# Patient Record
Sex: Female | Born: 1995
Health system: Southern US, Community
[De-identification: ages and names within clinical notes are randomized; demographics above are authoritative.]

## PROBLEM LIST (undated history)

## (undated) ENCOUNTER — Inpatient Hospital Stay (HOSPITAL_COMMUNITY): Payer: Self-pay

## (undated) DIAGNOSIS — J45909 Unspecified asthma, uncomplicated: Secondary | ICD-10-CM

## (undated) DIAGNOSIS — I10 Essential (primary) hypertension: Secondary | ICD-10-CM

## (undated) DIAGNOSIS — F329 Major depressive disorder, single episode, unspecified: Secondary | ICD-10-CM

## (undated) DIAGNOSIS — F419 Anxiety disorder, unspecified: Secondary | ICD-10-CM

## (undated) DIAGNOSIS — F32A Depression, unspecified: Secondary | ICD-10-CM

## (undated) HISTORY — DX: Essential (primary) hypertension: I10

## (undated) HISTORY — PX: NO PAST SURGERIES: SHX2092

---
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
## 2016-07-25 ENCOUNTER — Telehealth: Payer: Self-pay

## 2016-07-25 NOTE — Telephone Encounter (Signed)
ATC pt, but all phone numbers in chart have been disconnected.  Called pt mother for updated phone number, LMTCB.    Pt's mother called [facility] re: pt having breast pain. [facility] was not able to reach pt.   [facility] Gender: Female DOB: 08/31/1995 Age: [age] Y 4 M 18 D Return Phone Number: [phone] (Primary), [phone] (Secondary) Address: City/State/Zip: [place] Client [facility] Night - Client Client Site [facility] - Night Physician Alonza Smokerodd, Jeff - MD Contact Type Call Who Is Calling Patient / Member / Family / Caregiver Call Type Triage / Clinical Caller Name Delice Bisonara Relationship To Patient Mother Return Phone Number [phone] (Secondary) Chief Complaint Breast Symptoms Reason for Call Symptomatic / Request for Health Information Initial Comment Caller States their dtr has breast pain.

## 2016-07-26 NOTE — Telephone Encounter (Signed)
LMTCB

## 2016-07-27 NOTE — Telephone Encounter (Signed)
ATC pt x3 with no success. Closing message per protocol.

## 2016-10-03 ENCOUNTER — Other Ambulatory Visit: Payer: Self-pay | Admitting: Family Medicine

## 2016-10-04 NOTE — Telephone Encounter (Signed)
Denied.  Pt has not been seen in 2 years.  Will need to establish with another provider.

## 2016-10-19 ENCOUNTER — Ambulatory Visit (INDEPENDENT_AMBULATORY_CARE_PROVIDER_SITE_OTHER): Payer: Commercial Managed Care - HMO | Admitting: Family Medicine

## 2016-10-19 ENCOUNTER — Encounter: Payer: Self-pay | Admitting: Family Medicine

## 2016-10-19 VITALS — BP 110/76 | Temp 98.2°F | Ht 65.0 in | Wt 162.0 lb

## 2016-10-19 DIAGNOSIS — N912 Amenorrhea, unspecified: Secondary | ICD-10-CM

## 2016-10-19 MED ORDER — ETHYNODIOL DIAC-ETH ESTRADIOL 1-35 MG-MCG PO TABS: ORAL_TABLET | ORAL | 4 refills | Status: DC

## 2016-10-19 NOTE — Progress Notes (Signed)
Marixa iis a 21 year old single female nonsmoker who comes in today to renew her birth control pills  She takes BCPs for dysfunction uterine bleeding. She's not sexually active. LMP was April 4 to April 9  She gets routine eye care, dental care,  Social history she is single lives by herself going to [employer].......... Study  psychology. She also works at [employer].  Vaccinations up-to-date  She takes Lamictal 100 mg daily at bedtime, Seroquel 20 mg daily at bedtime and trazodone 50 mg daily at bedtime because a history of bipolar depression. Her father has a same condition.  She's a candidate for the Gardasil. Information given. She like to think about it.  BP 110/76 (BP Location: Right Arm, Patient Position: Sitting, Cuff Size: Normal)   Temp 98.2 F (36.8 C) (Oral)   Ht  (1.651 m)   Wt 162 lb (73.5 kg)   LMP 10/05/2016   BMI 26.96 kg/m  Examination HEENT is negative except she wears glasses neck was supple thyroid is not enlarged cardiopulmonary exam normal abdominal exam normal.  Healthy female  #1 dysfunction uterine bleeding......Marland Kitchen refill BCPs  #2 bipolar depression,,,,,,,, continue psychiatric meds follow-up by psychiatrist

## 2016-10-19 NOTE — Patient Instructions (Signed)
Continue current medications  Follow-up in one year sooner if any problems  Strongly consider the Gardasil vaccine,,,,,,,,,,, [facility]

## 2016-10-19 NOTE — Progress Notes (Signed)
Pre visit review using our clinic review tool, if applicable. No additional management support is needed unless otherwise documented below in the visit note. 

## 2016-11-07 ENCOUNTER — Ambulatory Visit: Payer: Commercial Managed Care - HMO | Admitting: Family Medicine

## 2017-03-05 ENCOUNTER — Emergency Department (HOSPITAL_COMMUNITY): Admission: EM | Admit: 2017-03-05 | Discharge: 2017-03-05 | Discharge status: Against Medical Advice | Payer: 59

## 2017-07-19 ENCOUNTER — Ambulatory Visit: Payer: 59 | Admitting: Family Medicine

## 2017-07-19 ENCOUNTER — Encounter: Payer: Self-pay | Admitting: Family Medicine

## 2017-07-19 VITALS — BP 102/80 | HR 98 | Temp 98.0°F | Wt 150.0 lb

## 2017-07-19 DIAGNOSIS — N760 Acute vaginitis: Secondary | ICD-10-CM | POA: Diagnosis not present

## 2017-07-19 MED ORDER — FLUCONAZOLE 150 MG PO TABS: ORAL_TABLET | ORAL | 3 refills | Status: DC

## 2017-07-19 MED ORDER — MAGIC MOUTHWASH W/LIDOCAINE: 5.0000 mL | Freq: Four times a day (QID) | ORAL | 4 refills | Status: DC | PRN

## 2017-07-19 NOTE — Patient Instructions (Signed)
Diflucan.......Marland Kitchen. 1 now........Marland Kitchen. repeat in 3 days............. if the 2 tablets do not resolve the vaginitis then repeated  In the future do not take antibiotics if you have of canker sores.......... antibiotics early indicated for a bacterial infection  You can gargle with Magic mouthwash with lidocaine when necessary for the canker sores

## 2017-07-19 NOTE — Progress Notes (Signed)
Soleil is a delightful 22 year old single female nonsmoker who comes in today for evaluation of vaginitis  She's been to urgent care twice for canker sores. She's given 2 rounds of antibiotics and steroids. She's tried over-the-counter medicines for the vaginitis which is secondary to the antibiotics however he over-the-counter medicine is not working.  She's had canker sores for many years. It's easy triggered by certain foods or stress. Explained her that this is not something that antibiotics would cure.  She takes Seroquel and Lamictal because a history of bipolar depression. Doing very well.  She takes BCPs. LMP now  Not sexually active. BP 102/80 (BP Location: Left Arm, Patient Position: Sitting, Cuff Size: Normal)   Pulse 98   Temp 98 F (36.7 C) (Oral)   Wt 150 lb (68 kg)   BMI 24.96 kg/m  Well-developed well-nourished female no acute distress vital signs stable she's afebrile NORMAL  #1 CANKER SORES.......... advised not to take antibiotics or steroids  #2 vaginitis secondary to antibiotic........Marland Kitchen. oral Diflucan.

## 2017-08-25 ENCOUNTER — Telehealth: Payer: Self-pay | Admitting: Family Medicine

## 2017-08-25 NOTE — Telephone Encounter (Signed)
Spoke with pt stated that she is okey to wait for Dr Tawanna Coolerodd to return to the office on Monday.

## 2017-08-25 NOTE — Telephone Encounter (Signed)
Please advise. Mother is not on DPR. Call pt directly.

## 2017-08-25 NOTE — Telephone Encounter (Signed)
Closed in error.

## 2017-08-25 NOTE — Telephone Encounter (Signed)
Pt is asking if another provider can call in medication please call pt at [phone]

## 2017-08-25 NOTE — Telephone Encounter (Signed)
Copied from CRM [id number]. Topic: Quick Communication - See Telephone Encounter >> Aug 25, 2017 12:13 PM Floria RavelingStovall, Shana A wrote: CRM for notification. See Telephone encounter for: mother called in and stated that pt was recently on antibiotics and has now got a yeast infection.  She would like to know if dr can call in meds for her?    Pharmacy - [facility] [address]    08/25/17.

## 2017-08-28 ENCOUNTER — Other Ambulatory Visit: Payer: Self-pay

## 2017-08-28 MED ORDER — FLUCONAZOLE 150 MG PO TABS: ORAL_TABLET | ORAL | 4 refills | Status: DC

## 2017-08-28 NOTE — Telephone Encounter (Signed)
Refills for Diflucan 100 mg with 4 refills has been sent to the pharmacy requested.

## 2017-11-09 ENCOUNTER — Other Ambulatory Visit: Payer: Self-pay | Admitting: Family Medicine

## 2017-11-10 NOTE — Telephone Encounter (Signed)
Rx is waiting for Dr Tawanna Cooler to return to the office on monday

## 2018-01-18 ENCOUNTER — Encounter: Payer: Self-pay | Admitting: Family Medicine

## 2018-01-18 ENCOUNTER — Ambulatory Visit (INDEPENDENT_AMBULATORY_CARE_PROVIDER_SITE_OTHER): Payer: BLUE CROSS/BLUE SHIELD | Admitting: Family Medicine

## 2018-01-18 VITALS — BP 108/70 | HR 127 | Temp 98.3°F | Ht 65.0 in | Wt 148.0 lb

## 2018-01-18 DIAGNOSIS — N912 Amenorrhea, unspecified: Secondary | ICD-10-CM

## 2018-01-18 NOTE — Progress Notes (Signed)
   Subjective:    Patient ID: Julie [name], female    DOB: 02/09/1996, 21 y.o.   MRN: [id number]  HPI Here asking for a venipuncture pregnancy test. Her LMP was from June 1st to 6th, and she missed July. She has taken 4 at home pregnancy tests and all have returned positive. She feels fine.    Review of Systems  Constitutional: Negative.   Respiratory: Negative.   Cardiovascular: Negative.        Objective:   Physical Exam  Constitutional: She is oriented to person, place, and time. She appears well-developed and well-nourished.  Cardiovascular: Normal rate, regular rhythm, normal heart sounds and intact distal pulses.  Pulmonary/Chest: Effort normal and breath sounds normal.  Neurological: She is alert and oriented to person, place, and time.          Assessment & Plan:  She is probably pregnant. We will get a qualitative HCG today. [facility] if this is positive.  Gershon CraneStephen Urian Martenson, MD

## 2018-01-19 ENCOUNTER — Telehealth: Payer: Self-pay | Admitting: Family Medicine

## 2018-01-19 LAB — HCG, SERUM, QUALITATIVE: Preg, Serum: POSITIVE — AB

## 2018-01-19 NOTE — Telephone Encounter (Signed)
Copied from CRM [id number]. Topic: Quick Communication - See Telephone Encounter >> Jan 19, 2018  2:04 PM Tamela OddiMartin, Don'Quashia, NT wrote: CRM for notification. See Telephone encounter for: 01/19/18. Patient  called and is inquiring about her lab results. Please call  CB# [phone]

## 2018-01-22 NOTE — Telephone Encounter (Signed)
Pt is aware of her lab results, given on 01/19/2018

## 2018-01-24 ENCOUNTER — Telehealth: Payer: Self-pay | Admitting: Family Medicine

## 2018-01-24 NOTE — Telephone Encounter (Signed)
Pt dropped off a [employer] Form to be completed by the provider.  Upon completion pt would like to be called at [phone] so that she can pick up the form.  Form was put in the providers folder.

## 2018-01-25 NOTE — Telephone Encounter (Signed)
Form was received and is awaiting completion

## 2018-01-26 NOTE — Telephone Encounter (Signed)
Pt called stating she received call to schedule additional vaccines that are needed. Pt is pregnant and not wanting to get vaccines at this time. She is requesting to pick up form that has been completed and will schedule the other vaccinations after the baby comes.

## 2018-01-26 NOTE — Telephone Encounter (Signed)
Called pt left a detailed message to pick up form at the front office.

## 2018-01-29 ENCOUNTER — Emergency Department (HOSPITAL_COMMUNITY)
Admission: EM | Admit: 2018-01-29 | Discharge: 2018-01-30 | Disposition: A | Discharge status: Home / Self Care | Payer: BLUE CROSS/BLUE SHIELD | Attending: Emergency Medicine | Admitting: Emergency Medicine

## 2018-01-29 ENCOUNTER — Encounter (HOSPITAL_COMMUNITY): Payer: Self-pay

## 2018-01-29 DIAGNOSIS — Z79899 Other long term (current) drug therapy: Secondary | ICD-10-CM | POA: Insufficient documentation

## 2018-01-29 DIAGNOSIS — O9952 Diseases of the respiratory system complicating childbirth: Secondary | ICD-10-CM | POA: Insufficient documentation

## 2018-01-29 DIAGNOSIS — Z3A01 Less than 8 weeks gestation of pregnancy: Secondary | ICD-10-CM | POA: Insufficient documentation

## 2018-01-29 DIAGNOSIS — O4690 Antepartum hemorrhage, unspecified, unspecified trimester: Secondary | ICD-10-CM

## 2018-01-29 DIAGNOSIS — J45909 Unspecified asthma, uncomplicated: Secondary | ICD-10-CM | POA: Diagnosis not present

## 2018-01-29 DIAGNOSIS — O209 Hemorrhage in early pregnancy, unspecified: Secondary | ICD-10-CM | POA: Diagnosis not present

## 2018-01-29 NOTE — ED Triage Notes (Signed)
Pt reports being [redacted] weeks pregnant, having sex and then bleeding for about 10 minutes which has now resolved.

## 2018-01-30 ENCOUNTER — Emergency Department (HOSPITAL_COMMUNITY): Payer: BLUE CROSS/BLUE SHIELD

## 2018-01-30 DIAGNOSIS — O209 Hemorrhage in early pregnancy, unspecified: Secondary | ICD-10-CM | POA: Diagnosis not present

## 2018-01-30 LAB — CBC WITH DIFFERENTIAL/PLATELET
ABS IMMATURE GRANULOCYTES: 0 10*3/uL (ref 0.0–0.1)
BASOS PCT: 1 %
Basophils Absolute: 0 10*3/uL (ref 0.0–0.1)
EOS ABS: 0.1 10*3/uL (ref 0.0–0.7)
Eosinophils Relative: 1 %
HCT: 33.9 % — ABNORMAL LOW (ref 36.0–46.0)
Hemoglobin: 11.2 g/dL — ABNORMAL LOW (ref 12.0–15.0)
Immature Granulocytes: 0 %
Lymphocytes Relative: 51 %
Lymphs Abs: 2.7 10*3/uL (ref 0.7–4.0)
MCH: 29.6 pg (ref 26.0–34.0)
MCHC: 33 g/dL (ref 30.0–36.0)
MCV: 89.7 fL (ref 78.0–100.0)
Monocytes Absolute: 0.5 10*3/uL (ref 0.1–1.0)
Monocytes Relative: 9 %
NEUTROS ABS: 2 10*3/uL (ref 1.7–7.7)
Neutrophils Relative %: 38 %
Platelets: 262 10*3/uL (ref 150–400)
RBC: 3.78 MIL/uL — ABNORMAL LOW (ref 3.87–5.11)
RDW: 11.9 % (ref 11.5–15.5)
WBC: 5.2 10*3/uL (ref 4.0–10.5)

## 2018-01-30 LAB — WET PREP, GENITAL
Clue Cells Wet Prep HPF POC: NONE SEEN
SPERM: NONE SEEN
TRICH WET PREP: NONE SEEN
YEAST WET PREP: NONE SEEN

## 2018-01-30 LAB — ABO/RH: ABO/RH(D): O POS

## 2018-01-30 LAB — BASIC METABOLIC PANEL
Anion gap: 7 (ref 5–15)
BUN: 9 mg/dL (ref 6–20)
CALCIUM: 9.3 mg/dL (ref 8.9–10.3)
CO2: 24 mmol/L (ref 22–32)
Chloride: 107 mmol/L (ref 98–111)
Creatinine, Ser: 0.57 mg/dL (ref 0.44–1.00)
GFR calc Af Amer: >60 mL/min (ref >60)
GFR calc non Af Amer: >60 mL/min (ref >60)
Glucose, Bld: 99 mg/dL (ref 70–99)
POTASSIUM: 3.7 mmol/L (ref 3.5–5.1)
Sodium: 138 mmol/L (ref 135–145)

## 2018-01-30 LAB — HCG, QUANTITATIVE, PREGNANCY: hCG, Beta Chain, Quant, S: 21335 m[IU]/mL — ABNORMAL HIGH (ref <5)

## 2018-01-30 NOTE — ED Provider Notes (Signed)
[facility] EMERGENCY DEPARTMENT Provider Note   CSN: [id number] Arrival date & time: 01/29/18  2323     History   Chief Complaint Chief Complaint  Patient presents with  . Vaginal Bleeding    HPI Julie [name] is a 22 y.o. female.  The history is provided by the patient and medical records.  Vaginal Bleeding  Primary symptoms include vaginal bleeding.   22 year old female G1P0 approx [redacted] weeks gestation with history of anxiety, asthma, depression, presenting to the ED for lower abdominal cramping and vaginal bleeding.  Patient reports she woke up this morning was having some lower abdominal cramping.  She and her husband had sex this evening and afterwards had a small amount of bright red bleeding for about 10 minutes.  This seems to have resolved at this time.  She denies any loss of fluid, gushes of blood, passage of clots, or other concerning symptoms.  She has not yet seen her OB/GYN.  She has started prenatal vitamins.  Past Medical History:  Diagnosis Date  . Anxiety   . Asthma   . Depression     Patient Active Problem List   Diagnosis Date Noted  . Acute vaginitis 07/19/2017  . Bipolar 1 disorder, mixed, moderate (HCC) 09/10/2013  . Acne 07/26/2011  . Amenorrhea 07/26/2011    History reviewed. No pertinent surgical history.   OB History    Gravida  1   Para      Term      Preterm      AB      Living        SAB      TAB      Ectopic      Multiple      Live Births               Home Medications    Prior to Admission medications   Medication Sig Start Date End Date Taking? Authorizing Provider  folic acid (FOLVITE) 1 MG tablet Take 1 mg by mouth every morning. 01/22/18  Yes [provider]  Prenatal Vit-Fe Fumarate-FA (PRENATAL MULTIVITAMIN) TABS tablet Take 1 tablet by mouth daily at 12 noon.   Yes [provider]  QUEtiapine (SEROQUEL XR) 400 MG 24 hr tablet Take 400 mg by mouth at bedtime. 01/04/18   Yes [provider]  lamoTRIgine (LAMICTAL) 100 MG tablet Take 1 tablet (100 mg total) by mouth at bedtime. Patient not taking: Reported on 01/30/2018 12/02/13   Kendrick Fries, NP  QUEtiapine (SEROQUEL) 200 MG tablet Take 1 tablet (200 mg total) by mouth at bedtime. Patient not taking: Reported on 01/30/2018 12/02/13   Kendrick Fries, NP    Family History Family History  Problem Relation Age of Onset  . Bipolar disorder Father   . Alcohol abuse Paternal Grandmother     Social History Social History   Tobacco Use  . Smoking status: Never Smoker  . Smokeless tobacco: Never Used  Substance Use Topics  . Alcohol use: No    Comment: hx of substance abuse.Denies all now  . Drug use: No    Comment: History of drug use/clean 3 months     Allergies   Patient has no known allergies.   Review of Systems Review of Systems  Genitourinary: Positive for vaginal bleeding.  All other systems reviewed and are negative.    Physical Exam Updated Vital Signs BP 116/68   Pulse (!) 118   Temp 98.5 F (36.9 C) (  Oral)   Resp 14   Ht 5\' 5"  (1.651 m)   Wt 68 kg (150 lb)   LMP 12/02/2017   SpO2 99%   BMI 24.96 kg/m   Physical Exam  Constitutional: She is oriented to person, place, and time. She appears well-developed and well-nourished.  HENT:  Head: Normocephalic and atraumatic.  Mouth/Throat: Oropharynx is clear and moist.  Eyes: Pupils are equal, round, and reactive to light. Conjunctivae and EOM are normal.  Neck: Normal range of motion.  Cardiovascular: Normal rate, regular rhythm and normal heart sounds.  Pulmonary/Chest: Effort normal and breath sounds normal.  Abdominal: Soft. Bowel sounds are normal. There is no tenderness. There is no rigidity and no guarding.  Soft, nontender  Genitourinary:  Genitourinary Comments: Exam chaperoned by NT Normal female external genitalia without visible lesions or rash; scant amount of blood in vaginal vault that appears old  and brown; no active bleeding; no appreciable discharge; cervical os is closed  Musculoskeletal: Normal range of motion.  Neurological: She is alert and oriented to person, place, and time.  Skin: Skin is warm and dry.  Psychiatric: She has a normal mood and affect.  Nursing note and vitals reviewed.    ED Treatments / Results  Labs (all labs ordered are listed, but only abnormal results are displayed) Labs Reviewed  WET PREP, GENITAL - Abnormal; Notable for the following components:      Result Value   WBC, Wet Prep HPF POC MANY (*)    All other components within normal limits  HCG, QUANTITATIVE, PREGNANCY - Abnormal; Notable for the following components:   hCG, Beta Chain, Quant, S 21,335 (*)    All other components within normal limits  CBC WITH DIFFERENTIAL/PLATELET - Abnormal; Notable for the following components:   RBC 3.78 (*)    Hemoglobin 11.2 (*)    HCT 33.9 (*)    All other components within normal limits  BASIC METABOLIC PANEL  ABO/RH  GC/CHLAMYDIA PROBE AMP ([place]) NOT AT [facility]    EKG None  Radiology US Ob Comp Less 14 Wks  Result Date: 01/30/2018 CLINICAL DATA:  Pregnant, vaginal bleeding EXAM: OBSTETRIC <14 WK [place] AND TRANSVAGINAL OB US TECHNIQUE: Both transabdominal and transvaginal ultrasound examinations were performed for complete evaluation of the gestation as well as the maternal uterus, adnexal regions, and pelvic cul-de-sac. Transvaginal technique was performed to assess early pregnancy. COMPARISON:  None. FINDINGS: Intrauterine gestational sac: Single Yolk sac:  Visualized. Embryo:  Not Visualized. MSD: 7.2 mm   5 w   3  d Subchorionic hemorrhage:  None visualized. Maternal uterus/adnexae: Bilateral ovaries are within normal limits, noting a right corpus luteal cyst. No free fluid. IMPRESSION: Single intrauterine gestation with yolk sac, measuring 5 weeks 3 days by mean sac diameter. No fetal pole is visualized. Consider follow-up pelvic ultrasound in  14 days to confirm viability, as clinically warranted. Electronically Signed   By: Charline Bills M.D.   On: 01/30/2018 01:44   US Ob Transvaginal  Result Date: 01/30/2018 CLINICAL DATA:  Pregnant, vaginal bleeding EXAM: OBSTETRIC <14 WK [place] AND TRANSVAGINAL OB US TECHNIQUE: Both transabdominal and transvaginal ultrasound examinations were performed for complete evaluation of the gestation as well as the maternal uterus, adnexal regions, and pelvic cul-de-sac. Transvaginal technique was performed to assess early pregnancy. COMPARISON:  None. FINDINGS: Intrauterine gestational sac: Single Yolk sac:  Visualized. Embryo:  Not Visualized. MSD: 7.2 mm   5 w   3  d Subchorionic hemorrhage:  None  visualized. Maternal uterus/adnexae: Bilateral ovaries are within normal limits, noting a right corpus luteal cyst. No free fluid. IMPRESSION: Single intrauterine gestation with yolk sac, measuring 5 weeks 3 days by mean sac diameter. No fetal pole is visualized. Consider follow-up pelvic ultrasound in 14 days to confirm viability, as clinically warranted. Electronically Signed   By: Charline BillsSriyesh  Krishnan M.D.   On: 01/30/2018 01:44    Procedures Procedures (including critical care time)  Medications Ordered in ED Medications - No data to display   Initial Impression / Assessment and Plan / ED Course  I have reviewed the triage vital signs and the nursing notes.  Pertinent labs & imaging results that were available during my care of the patient were reviewed by me and considered in my medical decision making (see chart for details).  22 year old G1, P0 approximately [redacted] weeks gestation presenting to the ED with vaginal bleeding.  This began after sexual intercourse this evening with significant other.  Bleeding seemed to have stopped at this time.  Does have some lower abdominal cramping.  Pelvic exam with some scant blood in the vaginal vault that appears old, there is no acute bleeding.  Cervical os is closed.   Labs overall reassuring.  US obtained, single gestational sac wit yolk sac seen, no fetal pole visualized.  Likely too early to be seen.  She has previously scheduled follow-up with her OB/GYN in 1 week, feel this is appropriate.  Encourage pelvic rest, continue prenatals.  She will return here or [facility] hospital sooner for any new/acute changes.  Final Clinical Impressions(s) / ED Diagnoses   Final diagnoses:  Vaginal bleeding in pregnancy    ED Discharge Orders    None       Garlon HatchetSanders, Sache Sane M, PA-C 01/30/18 0248    Glynn Octaveancour, Stephen, MD 01/30/18 [phone]

## 2018-01-30 NOTE — ED Notes (Signed)
Patient Alert and oriented to baseline. Stable and ambulatory to baseline. Patient verbalized understanding of the discharge instructions.  Patient belongings were taken by the patient.   

## 2018-01-30 NOTE — Telephone Encounter (Signed)
Forms are in the file cabinet waiting on pt to pick up "NO CHARGE".

## 2018-01-30 NOTE — ED Notes (Signed)
ED Provider at bedside. 

## 2018-01-30 NOTE — Discharge Instructions (Signed)
Pelvic rest for now. Follow-up with your OB as scheduled. Continue your prenatals. Return here or women's hospital sooner for any new/acute changes.

## 2018-01-31 LAB — GC/CHLAMYDIA PROBE AMP (~~LOC~~) NOT AT ARMC
CHLAMYDIA, DNA PROBE: NEGATIVE
NEISSERIA GONORRHEA: NEGATIVE

## 2018-02-17 ENCOUNTER — Emergency Department (HOSPITAL_COMMUNITY)
Admission: EM | Admit: 2018-02-17 | Discharge: 2018-02-18 | Disposition: A | Discharge status: Home / Self Care | Payer: BLUE CROSS/BLUE SHIELD | Attending: Emergency Medicine | Admitting: Emergency Medicine

## 2018-02-17 ENCOUNTER — Emergency Department (HOSPITAL_COMMUNITY): Payer: BLUE CROSS/BLUE SHIELD

## 2018-02-17 ENCOUNTER — Emergency Department (HOSPITAL_COMMUNITY): Admission: EM | Admit: 2018-02-17 | Discharge: 2018-02-17 | Discharge status: Absent without leave | Payer: Self-pay

## 2018-02-17 ENCOUNTER — Encounter (HOSPITAL_COMMUNITY): Payer: Self-pay

## 2018-02-17 DIAGNOSIS — Z79899 Other long term (current) drug therapy: Secondary | ICD-10-CM | POA: Insufficient documentation

## 2018-02-17 DIAGNOSIS — O26891 Other specified pregnancy related conditions, first trimester: Secondary | ICD-10-CM

## 2018-02-17 DIAGNOSIS — Z3A00 Weeks of gestation of pregnancy not specified: Secondary | ICD-10-CM | POA: Insufficient documentation

## 2018-02-17 DIAGNOSIS — R109 Unspecified abdominal pain: Secondary | ICD-10-CM

## 2018-02-17 DIAGNOSIS — O4690 Antepartum hemorrhage, unspecified, unspecified trimester: Secondary | ICD-10-CM

## 2018-02-17 DIAGNOSIS — O209 Hemorrhage in early pregnancy, unspecified: Secondary | ICD-10-CM | POA: Diagnosis present

## 2018-02-17 LAB — CBC WITH DIFFERENTIAL/PLATELET
ABS IMMATURE GRANULOCYTES: 0 10*3/uL (ref 0.0–0.1)
BASOS ABS: 0 10*3/uL (ref 0.0–0.1)
BASOS PCT: 0 %
EOS PCT: 1 %
Eosinophils Absolute: 0.1 10*3/uL (ref 0.0–0.7)
HCT: 31.7 % — ABNORMAL LOW (ref 36.0–46.0)
HEMOGLOBIN: 10.6 g/dL — AB (ref 12.0–15.0)
Immature Granulocytes: 0 %
LYMPHS PCT: 42 %
Lymphs Abs: 2.7 10*3/uL (ref 0.7–4.0)
MCH: 29.9 pg (ref 26.0–34.0)
MCHC: 33.4 g/dL (ref 30.0–36.0)
MCV: 89.3 fL (ref 78.0–100.0)
Monocytes Absolute: 0.5 10*3/uL (ref 0.1–1.0)
Monocytes Relative: 8 %
Neutro Abs: 3 10*3/uL (ref 1.7–7.7)
Neutrophils Relative %: 49 %
PLATELETS: 262 10*3/uL (ref 150–400)
RBC: 3.55 MIL/uL — ABNORMAL LOW (ref 3.87–5.11)
RDW: 11.8 % (ref 11.5–15.5)
WBC: 6.3 10*3/uL (ref 4.0–10.5)

## 2018-02-17 LAB — COMPREHENSIVE METABOLIC PANEL
ALBUMIN: 3.4 g/dL — AB (ref 3.5–5.0)
ALK PHOS: 63 U/L (ref 38–126)
ALT: 13 U/L (ref 0–44)
ANION GAP: 7 (ref 5–15)
AST: 14 U/L — ABNORMAL LOW (ref 15–41)
BUN: 5 mg/dL — ABNORMAL LOW (ref 6–20)
CALCIUM: 8.9 mg/dL (ref 8.9–10.3)
CHLORIDE: 107 mmol/L (ref 98–111)
CO2: 22 mmol/L (ref 22–32)
Creatinine, Ser: 0.45 mg/dL (ref 0.44–1.00)
GFR calc non Af Amer: >60 mL/min (ref >60)
GLUCOSE: 90 mg/dL (ref 70–99)
Potassium: 3.6 mmol/L (ref 3.5–5.1)
SODIUM: 136 mmol/L (ref 135–145)
Total Bilirubin: 0.5 mg/dL (ref 0.3–1.2)
Total Protein: 5.8 g/dL — ABNORMAL LOW (ref 6.5–8.1)

## 2018-02-17 LAB — WET PREP, GENITAL
CLUE CELLS WET PREP: NONE SEEN
Sperm: NONE SEEN
TRICH WET PREP: NONE SEEN
Yeast Wet Prep HPF POC: NONE SEEN

## 2018-02-17 LAB — HCG, QUANTITATIVE, PREGNANCY: HCG, BETA CHAIN, QUANT, S: 329485 m[IU]/mL — AB (ref <5)

## 2018-02-17 LAB — POC URINE PREG, ED: Preg Test, Ur: POSITIVE — AB

## 2018-02-17 MED ORDER — SODIUM CHLORIDE 0.9 % IV BOLUS
1000.0000 mL | Freq: Once | INTRAVENOUS | Status: AC
  Administered 2018-02-17: 1000 mL via INTRAVENOUS

## 2018-02-17 NOTE — ED Provider Notes (Signed)
[facility] EMERGENCY DEPARTMENT Provider Note   CSN: [id number] Arrival date & time: 02/17/18  1749     History   Chief Complaint Chief Complaint  Patient presents with  . Vaginal Bleeding    HPI Julie [name] is a 22 y.o. female G1P0 with a hx of asthma, anxiety, depression presents to the Emergency Department complaining of 1 episode of vaginal bleeding earlier today.  She reports no persistent vaginal bleeding.  Patient reports she has had lower abdominal "soreness and cramping" for the last several weeks.  She reports it is unchanged today.  Last vaginal intercourse was 1.5 weeks ago.  Patient denies persistent vomiting, lightheadedness, weakness.  Patient reports she has a follow-up with her OB/GYN scheduled on February 27, 2018.  She denies fever, chills, headache, neck pain, chest pain, shortness of breath, vomiting, diarrhea, weakness or dizziness, syncope, dysuria, hematuria.  Review shows that patient was evaluated for vaginal bleeding in early pregnancy here in the emergency department 01/30/2018.  Ultrasound at that time showed single IUP without visible yolk sac.  Patient was to have follow-up ultrasound in 14 days for viability.  She has not yet followed up with OB/GYN or for repeat ultrasound.   The history is provided by the patient, medical records and a significant other. No language interpreter was used.    Past Medical History:  Diagnosis Date  . Anxiety   . Asthma   . Depression     Patient Active Problem List   Diagnosis Date Noted  . Acute vaginitis 07/19/2017  . Bipolar 1 disorder, mixed, moderate (HCC) 09/10/2013  . Acne 07/26/2011  . Amenorrhea 07/26/2011    History reviewed. No pertinent surgical history.   OB History    Gravida  1   Para      Term      Preterm      AB      Living        SAB      TAB      Ectopic      Multiple      Live Births               Home Medications    Prior to Admission  medications   Medication Sig Start Date End Date Taking? Authorizing Provider  folic acid (FOLVITE) 1 MG tablet Take 1 mg by mouth every morning. 01/22/18   [provider]  lamoTRIgine (LAMICTAL) 100 MG tablet Take 1 tablet (100 mg total) by mouth at bedtime. Patient not taking: Reported on 01/30/2018 12/02/13   Kendrick Fries, NP  Prenatal Vit-Fe Fumarate-FA (PRENATAL MULTIVITAMIN) TABS tablet Take 1 tablet by mouth daily at 12 noon.    [provider]  QUEtiapine (SEROQUEL XR) 400 MG 24 hr tablet Take 400 mg by mouth at bedtime. 01/04/18   [provider]  QUEtiapine (SEROQUEL) 200 MG tablet Take 1 tablet (200 mg total) by mouth at bedtime. Patient not taking: Reported on 01/30/2018 12/02/13   Kendrick Fries, NP    Family History Family History  Problem Relation Age of Onset  . Bipolar disorder Father   . Alcohol abuse Paternal Grandmother     Social History Social History   Tobacco Use  . Smoking status: Never Smoker  . Smokeless tobacco: Never Used  Substance Use Topics  . Alcohol use: No    Comment: hx of substance abuse.Denies all now  . Drug use: No    Comment: History of drug use/clean  3 months     Allergies   Patient has no known allergies.   Review of Systems Review of Systems  Constitutional: Negative for appetite change, diaphoresis, fatigue, fever and unexpected weight change.  HENT: Negative for mouth sores.   Eyes: Negative for visual disturbance.  Respiratory: Negative for cough, chest tightness, shortness of breath and wheezing.   Cardiovascular: Negative for chest pain.  Gastrointestinal: Positive for abdominal pain. Negative for constipation, diarrhea, nausea and vomiting.  Endocrine: Negative for polydipsia, polyphagia and polyuria.  Genitourinary: Positive for vaginal bleeding. Negative for dysuria, frequency, hematuria and urgency.  Musculoskeletal: Negative for back pain and neck stiffness.  Skin: Negative for rash.    Allergic/Immunologic: Negative for immunocompromised state.  Neurological: Negative for syncope, light-headedness and headaches.  Hematological: Does not bruise/bleed easily.  Psychiatric/Behavioral: Negative for sleep disturbance. The patient is not nervous/anxious.      Physical Exam Updated Vital Signs BP 121/78   Pulse 87   Temp 99.3 F (37.4 C) (Oral)   Resp 16   LMP 12/07/2017   SpO2 100%   Physical Exam  Constitutional: She appears well-developed and well-nourished. No distress.  HENT:  Head: Normocephalic and atraumatic.  Eyes: Conjunctivae are normal.  Neck: Normal range of motion.  Cardiovascular: Normal rate, regular rhythm, normal heart sounds and intact distal pulses.  No murmur heard. Pulmonary/Chest: Effort normal and breath sounds normal. No respiratory distress. She has no wheezes.  Abdominal: Soft. Bowel sounds are normal. There is no tenderness. There is no rebound and no guarding. Hernia confirmed negative in the right inguinal area and confirmed negative in the left inguinal area.  Genitourinary: Uterus normal. No labial fusion. There is no rash, tenderness or lesion on the right labia. There is no rash, tenderness or lesion on the left labia. Uterus is not deviated, not enlarged, not fixed and not tender. Cervix exhibits no motion tenderness, no discharge and no friability. Right adnexum displays no mass, no tenderness and no fullness. Left adnexum displays no mass, no tenderness and no fullness. No erythema, tenderness or bleeding in the vagina. No foreign body in the vagina. No signs of injury around the vagina. Vaginal discharge (Scant, thick, white) found.  Genitourinary Comments: No blood in the vaginal vault Cervical os closed  Musculoskeletal: Normal range of motion. She exhibits no edema.  Lymphadenopathy:       Right: No inguinal adenopathy present.       Left: No inguinal adenopathy present.  Neurological: She is alert.  Skin: Skin is warm and dry.  She is not diaphoretic. No erythema.  Psychiatric: She has a normal mood and affect.  Nursing note and vitals reviewed.    ED Treatments / Results  Labs (all labs ordered are listed, but only abnormal results are displayed) Labs Reviewed  WET PREP, GENITAL - Abnormal; Notable for the following components:      Result Value   WBC, Wet Prep HPF POC MANY (*)    All other components within normal limits  HCG, QUANTITATIVE, PREGNANCY - Abnormal; Notable for the following components:   hCG, Beta Chain, Quant, S [id number],[id number] (*)    All other components within normal limits  CBC WITH DIFFERENTIAL/PLATELET - Abnormal; Notable for the following components:   RBC 3.55 (*)    Hemoglobin 10.6 (*)    HCT 31.7 (*)    All other components within normal limits  COMPREHENSIVE METABOLIC PANEL - Abnormal; Notable for the following components:   BUN 5 (*)  Total Protein 5.8 (*)    Albumin 3.4 (*)    AST 14 (*)    All other components within normal limits  POC URINE PREG, ED - Abnormal; Notable for the following components:   Preg Test, Ur POSITIVE (*)    All other components within normal limits  URINALYSIS, ROUTINE W REFLEX MICROSCOPIC  GC/CHLAMYDIA PROBE AMP ([place]) NOT AT [facility]    Radiology US Ob Comp < 14 Wks  Result Date: 02/17/2018 CLINICAL DATA:  Vaginal bleeding.  Pregnant patient. EXAM: OBSTETRIC <14 WK [place] AND TRANSVAGINAL OB US TECHNIQUE: Both transabdominal and transvaginal ultrasound examinations were performed for complete evaluation of the gestation as well as the maternal uterus, adnexal regions, and pelvic cul-de-sac. Transvaginal technique was performed to assess early pregnancy. COMPARISON:  Pelvic ultrasound 01/30/2018 FINDINGS: Intrauterine gestational sac: Single Yolk sac:  Visualized. Embryo:  Visualized. Cardiac Activity: Visualized. Heart Rate: 165 bpm CRL:  16.8 mm   a w   1 d                  [place] EDC: 09/28/2018 Subchorionic hemorrhage:  None visualized. Maternal  uterus/adnexae: Normal right and left ovaries. Corpus luteum within the right ovary. No free fluid in the pelvis. IMPRESSION: Single live intrauterine gestation.  No subchorionic hemorrhage. Electronically Signed   By: Annia Belt M.D.   On: 02/17/2018 23:01   US Ob Transvaginal  Result Date: 02/17/2018 CLINICAL DATA:  Vaginal bleeding.  Pregnant patient. EXAM: OBSTETRIC <14 WK [place] AND TRANSVAGINAL OB US TECHNIQUE: Both transabdominal and transvaginal ultrasound examinations were performed for complete evaluation of the gestation as well as the maternal uterus, adnexal regions, and pelvic cul-de-sac. Transvaginal technique was performed to assess early pregnancy. COMPARISON:  Pelvic ultrasound 01/30/2018 FINDINGS: Intrauterine gestational sac: Single Yolk sac:  Visualized. Embryo:  Visualized. Cardiac Activity: Visualized. Heart Rate: 165 bpm CRL:  16.8 mm   a w   1 d                  [place] EDC: 09/28/2018 Subchorionic hemorrhage:  None visualized. Maternal uterus/adnexae: Normal right and left ovaries. Corpus luteum within the right ovary. No free fluid in the pelvis. IMPRESSION: Single live intrauterine gestation.  No subchorionic hemorrhage. Electronically Signed   By: Annia Belt M.D.   On: 02/17/2018 23:01    Procedures Procedures (including critical care time)  Medications Ordered in ED Medications  sodium chloride 0.9 % bolus 1,000 mL (1,000 mLs Intravenous Bolus from Bag 02/17/18 2234)     Initial Impression / Assessment and Plan / ED Course  I have reviewed the triage vital signs and the nursing notes.  Pertinent labs & imaging results that were available during my care of the patient were reviewed by me and considered in my medical decision making (see chart for details).     Reports with one episode of minor vaginal bleeding prior to arrival.  She reports lower abdominal cramping which has been constant for the last several weeks.  No worsening today.  No nausea or vomiting.  Abdomen is  soft and nontender.  Repeat ultrasound does show a single living IUP with visible heart rate.  Discussed this with patient and significant other.  She is to follow-up with her OB/GYN as directed.  Also discussed reasons to return immediately to the emergency department or MAU.   Final Clinical Impressions(s) / ED Diagnoses   Final diagnoses:  Vaginal bleeding in pregnancy  Abdominal pain during pregnancy in first  trimester    ED Discharge Orders    None       Jelan Batterton, Boyd KerbsHannah, PA-C 02/17/18 2352    Margarita Grizzleay, Danielle, MD 02/18/18 [phone]

## 2018-02-17 NOTE — Discharge Instructions (Addendum)
1. Medications: usual home medications including your prenatal vitamins 2. Treatment: rest, drink plenty of fluids,  3. Follow Up: Please followup with your OB/GYN at your scheduled appointment for discussion of your diagnoses and further evaluation after today's visit; if you do not have a primary care doctor use the resource guide provided to find one; Please return to the ER/MAU for worsening abd pain, return of vaginal bleeding, fevers or other concerns

## 2018-02-17 NOTE — ED Notes (Signed)
Patient transported to [facility]

## 2018-02-17 NOTE — ED Triage Notes (Signed)
[redacted] weeks pregnant.  G1P0.  Onset today vaginal bleeding while using bathroom.  Pt describes bleeding as "not liquid, it was thick".  NO bleeding since onset.  Did have abd cramping earlier today but pt states "that is normal".  Last intercourse 1 1/2 weeks ago.

## 2018-02-19 LAB — GC/CHLAMYDIA PROBE AMP (~~LOC~~) NOT AT ARMC
Chlamydia: NEGATIVE
NEISSERIA GONORRHEA: NEGATIVE

## 2018-03-06 ENCOUNTER — Inpatient Hospital Stay (HOSPITAL_COMMUNITY)
Admission: AD | Admit: 2018-03-06 | Discharge: 2018-03-06 | Disposition: A | Discharge status: Home / Self Care | Payer: BLUE CROSS/BLUE SHIELD | Source: Ambulatory Visit | Attending: Obstetrics and Gynecology | Admitting: Obstetrics and Gynecology

## 2018-03-06 ENCOUNTER — Encounter: Payer: Self-pay | Admitting: Student

## 2018-03-06 DIAGNOSIS — F329 Major depressive disorder, single episode, unspecified: Secondary | ICD-10-CM | POA: Diagnosis not present

## 2018-03-06 DIAGNOSIS — O99341 Other mental disorders complicating pregnancy, first trimester: Secondary | ICD-10-CM | POA: Diagnosis not present

## 2018-03-06 DIAGNOSIS — O99511 Diseases of the respiratory system complicating pregnancy, first trimester: Secondary | ICD-10-CM | POA: Diagnosis not present

## 2018-03-06 DIAGNOSIS — Z79899 Other long term (current) drug therapy: Secondary | ICD-10-CM | POA: Diagnosis not present

## 2018-03-06 DIAGNOSIS — O209 Hemorrhage in early pregnancy, unspecified: Secondary | ICD-10-CM

## 2018-03-06 DIAGNOSIS — J45909 Unspecified asthma, uncomplicated: Secondary | ICD-10-CM | POA: Insufficient documentation

## 2018-03-06 DIAGNOSIS — O4691 Antepartum hemorrhage, unspecified, first trimester: Secondary | ICD-10-CM | POA: Diagnosis not present

## 2018-03-06 DIAGNOSIS — Z3A10 10 weeks gestation of pregnancy: Secondary | ICD-10-CM | POA: Diagnosis not present

## 2018-03-06 DIAGNOSIS — F419 Anxiety disorder, unspecified: Secondary | ICD-10-CM | POA: Diagnosis not present

## 2018-03-06 LAB — URINALYSIS, ROUTINE W REFLEX MICROSCOPIC
BILIRUBIN URINE: NEGATIVE
Glucose, UA: NEGATIVE mg/dL
Hgb urine dipstick: NEGATIVE
Ketones, ur: NEGATIVE mg/dL
Leukocytes, UA: NEGATIVE
NITRITE: NEGATIVE
PROTEIN: NEGATIVE mg/dL
Specific Gravity, Urine: 1.014 (ref 1.005–1.030)
pH: 6 (ref 5.0–8.0)

## 2018-03-06 NOTE — MAU Provider Note (Signed)
Chief Complaint: Vaginal Bleeding   First Provider Initiated Contact with Patient 03/06/18 2251     SUBJECTIVE HPI: Julie [name] is a 22 y.o. G1P0 at [redacted]w[redacted]d who presents to Maternity Admissions reporting vaginal bleeding. Has noticed spotting when she wiped that started this evening. Bright red blood on toilet paper. Not having to wear a pad & not passing clots. Denies abdominal pain, vaginal discharge, or dysuria. No recent intercourse or exams. Called office & was told to come in.    Past Medical History:  Diagnosis Date  . Anxiety   . Asthma   . Depression    OB History  Gravida Para Term Preterm AB Living  1            SAB TAB Ectopic Multiple Live Births               # Outcome Date GA Lbr Len/2nd Weight Sex Delivery Anes PTL Lv  1 Current            History reviewed. No pertinent surgical history. Social History   Socioeconomic History  . Marital status: Single    Spouse name: Not on file  . Number of children: Not on file  . Years of education: Not on file  . Highest education level: Not on file  Occupational History  . 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 Financial resource strain: Not on file  . Food insecurity:    Worry: Not on file    Inability: Not on file  . Transportation needs:    Medical: Not on file    Non-medical: Not on file  Tobacco Use  . Smoking status: Never Smoker  . Smokeless tobacco: Never Used  Substance and Sexual Activity  . Alcohol use: No    Comment: hx of substance abuse.Denies all now  . Drug use: No    Comment: History of drug use/clean 3 months  . Sexual activity: Yes    Partners: Male    Birth control/protection: None    Comment: Has boyfriend/denies sexual activity  Lifestyle  . Physical activity:    Days per week: Not on file    Minutes per session: Not on file  . Stress: Not on file  Relationships  . Social connections:    Talks on phone: Not on  file    Gets together: Not on file    Attends religious service: Not on file    Active member of club or organization: Not on file    Attends meetings of clubs or organizations: Not on file    Relationship status: Not on file  . Intimate partner violence:    Fear of current or ex partner: Not on file    Emotionally abused: Not on file    Physically abused: Not on file    Forced sexual activity: Not on file  Other Topics Concern  . Not on file  Social History Narrative  . Not on file   Family History  Problem Relation Age of Onset  . Bipolar disorder Father   . Alcohol abuse Paternal Grandmother    No current facility-administered medications on file prior to encounter.    Current Outpatient Medications on File Prior to Encounter  Medication Sig Dispense Refill  . folic acid (FOLVITE) 1 MG tablet Take 1 mg by mouth every morning.  0  . lamoTRIgine (LAMICTAL) 100 MG tablet Take 1 tablet (100 mg  total) by mouth at bedtime. (Patient not taking: Reported on 01/30/2018) 30 tablet 0  . Prenatal Vit-Fe Fumarate-FA (PRENATAL MULTIVITAMIN) TABS tablet Take 1 tablet by mouth daily at 12 noon.    . QUEtiapine (SEROQUEL XR) 400 MG 24 hr tablet Take 400 mg by mouth at bedtime.  5  . QUEtiapine (SEROQUEL) 200 MG tablet Take 1 tablet (200 mg total) by mouth at bedtime. (Patient not taking: Reported on 01/30/2018) 30 tablet 0   No Known Allergies  I have reviewed patient's Past Medical Hx, Surgical Hx, Family Hx, Social Hx, medications and allergies.   Review of Systems  Constitutional: Negative.   Gastrointestinal: Negative.   Genitourinary: Positive for vaginal bleeding. Negative for dysuria and vaginal discharge.    OBJECTIVE Patient Vitals for the past 24 hrs:  BP Temp Pulse Resp SpO2 Height Weight  03/06/18 2232 135/85 98.4 F (36.9 C) (!) 101 16 100 % 5\' 5"  (1.651 m) 69 kg   Constitutional: Well-developed, well-nourished female in no acute distress.  Cardiovascular: normal rate &  rhythm, no murmur Respiratory: normal rate and effort. Lung sounds clear throughout GI: Abd soft, non-tender, Pos BS x 4. No guarding or rebound tenderness MS: Extremities nontender, no edema, normal ROM Neurologic: Alert and oriented x 4.  GU:     SPECULUM EXAM: NEFG, physiologic discharge, no blood noted, ectropion  present  BIMANUAL: No CMT. cervix closed  LAB RESULTS Results for orders placed or performed during the hospital encounter of 03/06/18 (from the past 24 hour(s))  Urinalysis, Routine w reflex microscopic     Status: None   Collection Time: 03/06/18 11:10 PM  Result Value Ref Range   Color, Urine YELLOW YELLOW   APPearance CLEAR CLEAR   Specific Gravity, Urine 1.014 1.005 - 1.030   pH 6.0 5.0 - 8.0   Glucose, UA NEGATIVE NEGATIVE mg/dL   Hgb urine dipstick NEGATIVE NEGATIVE   Bilirubin Urine NEGATIVE NEGATIVE   Ketones, ur NEGATIVE NEGATIVE mg/dL   Protein, ur NEGATIVE NEGATIVE mg/dL   Nitrite NEGATIVE NEGATIVE   Leukocytes, UA NEGATIVE NEGATIVE    IMAGING No results found.  MAU COURSE Orders Placed This Encounter  Procedures  . Urinalysis, Routine w reflex microscopic  . Discharge patient   No orders of the defined types were placed in this encounter.   MDM FHT 160 by doppler No blood on exam Negative vaginal cultures 2 weeks ago Cervix closed O positive  Unable to reach Dr. Henderson Cloud. Patient discharged in stable condition  ASSESSMENT 1. Vaginal bleeding in pregnancy, first trimester   2. [redacted] weeks gestation of pregnancy     PLAN Discharge home in stable condition. Bleeding/SAB precautions  Follow-up Information    Ob/Gyn, [facility] Follow up.   Contact information: [address] [place] [address] [phone]          Allergies as of 03/06/2018   No Known Allergies     Medication List    STOP taking these medications   lamoTRIgine 100 MG tablet Commonly known as:  LAMICTAL     TAKE these medications   folic  acid 1 MG tablet Commonly known as:  FOLVITE Take 1 mg by mouth every morning.   prenatal multivitamin Tabs tablet Take 1 tablet by mouth daily at 12 noon.   QUEtiapine 400 MG 24 hr tablet Commonly known as:  SEROQUEL XR Take 400 mg by mouth at bedtime. What changed:  Another medication with the same name was removed. Continue  taking this medication, and follow the directions you see here.        Judeth Horn, NP 03/06/2018  11:46 PM

## 2018-03-06 NOTE — MAU Note (Signed)
Pt c/o vaginal bleeding when wiping that she noticed around 9:30pm. States she has had vaginal bleeding before and its about the same. Called on call doctor and was told "to come in because it could be a miscarriage." Pt denies pain. Pt denies recent intercourse.

## 2018-03-06 NOTE — Discharge Instructions (Signed)

## 2018-03-08 ENCOUNTER — Encounter (HOSPITAL_COMMUNITY): Payer: Self-pay

## 2018-05-08 ENCOUNTER — Encounter: Payer: BLUE CROSS/BLUE SHIELD | Admitting: Family Medicine

## 2018-05-16 ENCOUNTER — Ambulatory Visit (INDEPENDENT_AMBULATORY_CARE_PROVIDER_SITE_OTHER): Payer: Self-pay | Admitting: Psychiatry

## 2018-05-16 ENCOUNTER — Encounter: Payer: Self-pay | Admitting: Psychiatry

## 2018-05-16 VITALS — BP 114/64 | HR 68 | Ht 66.0 in | Wt 162.0 lb

## 2018-05-16 DIAGNOSIS — F428 Other obsessive-compulsive disorder: Secondary | ICD-10-CM

## 2018-05-16 DIAGNOSIS — F3173 Bipolar disorder, in partial remission, most recent episode manic: Secondary | ICD-10-CM

## 2018-05-16 DIAGNOSIS — Z363 Encounter for antenatal screening for malformations: Secondary | ICD-10-CM | POA: Diagnosis not present

## 2018-05-16 DIAGNOSIS — F429 Obsessive-compulsive disorder, unspecified: Secondary | ICD-10-CM | POA: Insufficient documentation

## 2018-05-16 DIAGNOSIS — F411 Generalized anxiety disorder: Secondary | ICD-10-CM

## 2018-05-16 MED ORDER — QUETIAPINE FUMARATE ER 400 MG PO TB24: 400.0000 mg | ORAL_TABLET | Freq: Every day | ORAL | 2 refills | Status: DC

## 2018-05-16 NOTE — Progress Notes (Signed)
[facility]  Patient ID: Ledell PeoplesMahkala A Champagne,  MRN: [id number]  PCP: Roderick Peeodd, Jeffrey A, MD  Date of Evaluation: 05/16/2018 Time spent:20 minutes  Chief Complaint:  Chief Complaint    Depression; Anxiety      HISTORY/CURRENT STATUS: Maddyx is seen individually face-to-face with consent not collateral for psychiatric interview and exam in 6052-month evaluation and management of bipolar disorder and generalized and obsessional act anxiety now comorbid with pregnancy.  She has been declining opportunity for more frequent appointments as she attends [employer] college classes for 5 days weekly in [place] [employer] and works 6 days weekly having little remaining time.  She has no interim decompensations, but she reviews the discussion with female obstetrician she appreciates greatly concluding the need to continue the Seroquel considering depression and anxiety to have more negative consequences for the pregnancy and baby than patient being stable on her Seroquel without Lamictal.  She worried for a month about stopping the Lamictal but has found she can function safely and successfully on the Seroquel alone.  She does take her folic acid and prenatal vitamin.  She is engaged but they defer marriage until she is out of college.  She has the support of her family of origin.  Depression       The patient presents with depression.  This is a chronic problem.  The current episode started more than 1 year ago.   The onset quality is gradual.   The problem occurs intermittently.  The most recent episode lasted 2 months.    The problem has been gradually improving since onset.  Associated symptoms include body aches.  Associated symptoms include no decreased concentration, no fatigue, no helplessness, no hopelessness, does not have insomnia, not irritable, no restlessness, no decreased interest, no appetite change, no myalgias, no headaches, no indigestion, not sad and no suicidal ideas.     The  symptoms are aggravated by work stress and social issues.  Past treatments include other medications.  Compliance with treatment is good.  Past compliance problems include medical issues and medication issues.  Previous treatment provided significant relief.  Risk factors include a change in medication usage/dosage, family history of mental illness, history of mental illness, major life event and stress.   Past medical history includes terminal illness, anxiety, bipolar disorder, depression and obsessive-compulsive disorder.     Pertinent negatives include no chronic fatigue syndrome, no chronic pain, no fibromyalgia, no hypothyroidism, no thyroid problem, no chronic illness, no recent illness, no life-threatening condition, no physical disability, no recent psychiatric admission, no Alzheimer's disease, no brain trauma, no dementia, no eating disorder, no mental health disorder, no post-traumatic stress disorder, no schizophrenia, no suicide attempts and no head trauma. Anxiety  Presents for follow-up visit. Symptoms include excessive worry, nervous/anxious behavior and obsessions. Patient reports no chest pain, confusion, decreased concentration, depressed mood, dizziness, dry mouth, feeling of choking, hyperventilation, impotence, insomnia, irritability, malaise, muscle tension, nausea, palpitations, panic, restlessness, shortness of breath or suicidal ideas. Symptoms occur occasionally. The most recent episode lasted 45 minutes. The severity of symptoms is moderate. The quality of sleep is fair. Nighttime awakenings: occasional.   Her past medical history is significant for depression. There is no history of suicide attempts. Compliance with medications is 76-100%.    Individual Medical History/ Review of Systems: Changes? :Yes  EDC is 09/28/2018 working well with her obstetrician on prenatal vitamin and folic acid.  She has eyeglasses and the history of LDL cholesterol elevation with all medical  laboratory monitoring through the obstetrician now that she is pregnant.  Allergies: Patient has no known allergies.  Current Medications:  Current Outpatient Medications:  .  folic acid (FOLVITE) 1 MG tablet, Take 1 mg by mouth every morning., Disp: , Rfl: 0 .  Prenatal Vit-Fe Fumarate-FA (PRENATAL MULTIVITAMIN) TABS tablet, Take 1 tablet by mouth daily at 12 noon., Disp: , Rfl:  .  QUEtiapine (SEROQUEL XR) 400 MG 24 hr tablet, Take 1 tablet (400 mg total) by mouth at bedtime., Disp: 30 tablet, Rfl: 2 Medication Side Effects: none  Family Medical/ Social History: Changes? Yes .  She is engaged but deferring marriage until after college now at [employer] 5 days weekly and still working 6 days weekly.  Father has bipolar treated successfully with Seroquel  MENTAL HEALTH EXAM: Muscle strength 5/5, postural reflexes 0/0, and AIMS = 0. Blood pressure 114/64, pulse 68, height 5\' 6"  (1.676 m), weight 162 lb (73.5 kg), last menstrual period 12/07/2017.Body mass index is 26.15 kg/m.  General Appearance: Casual and Fairly Groomed  Eye Contact:  Fair  Speech:  Clear and Coherent  Volume:  Normal  Mood:  Anxious and Euthymic  Affect:  Full Range and Anxious  Thought Process:  Goal Directed  Orientation:  Full (Time, Place, and Person)  Thought Content: Obsessions and Rumination   Suicidal Thoughts:  No  Homicidal Thoughts:  No  Memory:  Remote;   Good  Judgement:  Good  Insight:  Fair  Psychomotor Activity:  Normal  Concentration:  Concentration: Fair and Attention Span: Good  Recall:  Good  Fund of Knowledge: Good  Language: Good  Assets:  Desire for Improvement Talents/Skills Vocational/Educational  ADL's:  Intact  Cognition: WNL  Prognosis:  Good    DIAGNOSES:    ICD-10-CM   1. Bipolar I disorder, moderate, current or most recent episode manic, in partial remission, with anxious distress (HCC) F31.73   2. Generalized anxiety disorder F41.1   3. Other obsessive-compulsive  disorders F42.8     Receiving Psychotherapy: No    RECOMMENDATIONS: We discuss third trimester management of the Seroquel daily to reduce the dose as possible, applying different discussion but similar options through the second trimester.  She has an anatomical sonography of the pregnancy in the next week.  Current dosing is continued as concluded for Seroquel 400 mg ER nightly sending a month supply and 2 refills to [facility] on [address] [place].  Tentative behavioral and self-management skills addressed and updated particularly for integration in pregnancy care particularly surrounding delivery and newborn care responsibilities.  She copes well with the session but is not interested in or going to return early for such updates, to be seen again in 3 months.  Chauncey Mann, MD

## 2018-05-29 ENCOUNTER — Inpatient Hospital Stay (HOSPITAL_COMMUNITY)
Admission: AD | Admit: 2018-05-29 | Discharge: 2018-05-29 | Disposition: A | Discharge status: Home / Self Care | Payer: Medicaid Other | Source: Ambulatory Visit | Attending: Obstetrics and Gynecology | Admitting: Obstetrics and Gynecology

## 2018-05-29 ENCOUNTER — Encounter (HOSPITAL_COMMUNITY): Payer: Self-pay

## 2018-05-29 DIAGNOSIS — Z3A22 22 weeks gestation of pregnancy: Secondary | ICD-10-CM | POA: Insufficient documentation

## 2018-05-29 DIAGNOSIS — R109 Unspecified abdominal pain: Secondary | ICD-10-CM

## 2018-05-29 DIAGNOSIS — Z79899 Other long term (current) drug therapy: Secondary | ICD-10-CM | POA: Insufficient documentation

## 2018-05-29 DIAGNOSIS — O26892 Other specified pregnancy related conditions, second trimester: Secondary | ICD-10-CM | POA: Diagnosis not present

## 2018-05-29 DIAGNOSIS — Z3492 Encounter for supervision of normal pregnancy, unspecified, second trimester: Secondary | ICD-10-CM

## 2018-05-29 LAB — URINALYSIS, ROUTINE W REFLEX MICROSCOPIC
Bilirubin Urine: NEGATIVE
Glucose, UA: 50 mg/dL — AB
Ketones, ur: 5 mg/dL — AB
Nitrite: NEGATIVE
Protein, ur: NEGATIVE mg/dL
Specific Gravity, Urine: 1.023 (ref 1.005–1.030)
pH: 6 (ref 5.0–8.0)

## 2018-05-29 NOTE — MAU Note (Signed)
Pt here with c/o cramping since about 1930; denies any bleeding or leaking

## 2018-05-29 NOTE — MAU Provider Note (Signed)
History     CSN: [id number]  Arrival date and time: 05/29/18 2114   First Provider Initiated Contact with Patient 05/29/18 2155      Chief Complaint  Patient presents with  . Abdominal Pain   Julie [name] is a 22 y.o. G1P0 at [redacted]w[redacted]d who presents to MAU with complaints of abdominal pain. She reports pain started today around 1930, describes pain as cramping that initially started in her back and radiated around to her lower abdomen, rated pain 5/10- did not take any medication prior to arrival to MAU. She reports prior to coming to MAU the pain resolved without medication and she is not having pain currently. Denies complications during this pregnancy. Denies vaginal bleeding, discharge or LOF. She reports see has not felt fetal movement since this morning, she usually feels movement every couple of hours but reports this being a change.    OB History    Gravida  1   Para      Term      Preterm      AB      Living        SAB      TAB      Ectopic      Multiple      Live Births              Past Medical History:  Diagnosis Date  . Anxiety   . Asthma   . Depression     History reviewed. No pertinent surgical history.  Family History  Problem Relation Age of Onset  . Bipolar disorder Father   . Alcohol abuse Paternal Grandmother     Social History   Tobacco Use  . Smoking status: Never Smoker  . Smokeless tobacco: Never Used  Substance Use Topics  . Alcohol use: No    Comment: hx of substance abuse.Denies all now  . Drug use: No    Comment: History of drug use/clean 3 months    Allergies: No Known Allergies  Medications Prior to Admission  Medication Sig Dispense Refill Last Dose  . folic acid (FOLVITE) 1 MG tablet Take 1 mg by mouth every morning.  0 01/29/2018 at Unknown time  . Prenatal Vit-Fe Fumarate-FA (PRENATAL MULTIVITAMIN) TABS tablet Take 1 tablet by mouth daily at 12 noon.   01/29/2018 at Unknown time  . QUEtiapine (SEROQUEL XR)  400 MG 24 hr tablet Take 1 tablet (400 mg total) by mouth at bedtime. 30 tablet 2     Review of Systems  Constitutional: Negative.   Respiratory: Negative.   Cardiovascular: Negative.   Gastrointestinal: Positive for abdominal pain. Negative for constipation, diarrhea, nausea and vomiting.       Resolved   Genitourinary: Negative.    Physical Exam   Blood pressure 134/64, temperature 98.4 F (36.9 C), temperature source Oral, resp. rate 18, height 5\' 5"  (1.651 m), weight 74.8 kg, last menstrual period 12/07/2017, SpO2 100 %.  Physical Exam  Nursing note and vitals reviewed. Constitutional: She is oriented to person, place, and time. She appears well-developed and well-nourished. No distress.  Cardiovascular: Normal rate, regular rhythm and normal heart sounds.  Respiratory: Effort normal and breath sounds normal. No respiratory distress. She has no wheezes. She has no rales.  GI: Soft. She exhibits no distension. There is no tenderness. There is no rebound and no guarding.  Genitourinary: No vaginal discharge found.  Musculoskeletal: Normal range of motion. She exhibits no edema.  Neurological: She is alert  and oriented to person, place, and time.   Dilation: Closed Exam by:: Aundria Rudogers, CNM  FHR 154 by doppler   Bedside US performed  Pt informed that the ultrasound is considered a limited OB ultrasound and is not intended to be a complete ultrasound exam.  Patient also informed that the ultrasound is not being completed with the intent of assessing for fetal or placental anomalies or any pelvic abnormalities.  Explained that the purpose of today's ultrasound is to assess for  movement and presentation.  Patient acknowledges the purpose of the exam and the limitations of the study.   Fetal movement seen on US, 5 movements seen in less than 10 minutes, patient reports feeling movement prior to bedside US.   MAU Course  Procedures  MDM Bedside US  UA Cervical examination :  closed/posterior   Educated and discussed fetal movement during pregnancy, educated on fetal kick counts and can start tracing after 24 weeks. Discussed reasons to return to MAU. Educated and discussed abdominal pain during pregnancy and safe medications during pregnancy. Encouraged to increase hydration during pregnancy. Patient reports continued fetal movement prior to discharge home. Follow up as scheduled for prenatal appointment.   Assessment and Plan   1. Fetal movement present, second trimester   2. [redacted] weeks gestation of pregnancy   3. Abdominal pain during pregnancy in second trimester    Discharge home  Hydration  Follow up as scheduled for prenatal appointment  Return to MAU as needed Safe medications during pregnancy   Sharyon CableVeronica C Sarajean Dessert CNM 05/29/2018, 10:59 PM

## 2018-05-29 NOTE — Discharge Instructions (Signed)

## 2018-05-31 LAB — CULTURE, OB URINE: Culture: NO GROWTH

## 2018-06-07 ENCOUNTER — Encounter (HOSPITAL_COMMUNITY): Payer: Self-pay

## 2018-07-04 NOTE — L&D Delivery Note (Addendum)
Delivery Note At 12:03 PM a viable and healthy female was delivered via Vaginal, Spontaneous (Presentation: right occiput; anterior ).  APGAR: 8, 9; weight pending .   Placenta status: spontaneous, intact, with marginal abruption noted.  Cord: 3V   Anesthesia: epidural Episiotomy: None Lacerations: 2nd degree;Perineal Suture Repair: 3.0 Est. Blood Loss (mL):  469 cc  Mom to postpartum.  Baby to Couplet care / Skin to Skin.  Waynard Reeds 09/26/2018, 12:31 PM

## 2018-07-05 DIAGNOSIS — Z3480 Encounter for supervision of other normal pregnancy, unspecified trimester: Secondary | ICD-10-CM | POA: Diagnosis not present

## 2018-07-05 DIAGNOSIS — Z23 Encounter for immunization: Secondary | ICD-10-CM | POA: Diagnosis not present

## 2018-07-12 ENCOUNTER — Other Ambulatory Visit: Payer: Self-pay

## 2018-07-12 ENCOUNTER — Encounter (HOSPITAL_COMMUNITY): Payer: Self-pay

## 2018-07-12 ENCOUNTER — Inpatient Hospital Stay (HOSPITAL_COMMUNITY)
Admission: AD | Admit: 2018-07-12 | Discharge: 2018-07-12 | Disposition: A | Discharge status: Home / Self Care | Payer: Medicaid Other | Source: Ambulatory Visit | Attending: Obstetrics | Admitting: Obstetrics

## 2018-07-12 DIAGNOSIS — Z3493 Encounter for supervision of normal pregnancy, unspecified, third trimester: Secondary | ICD-10-CM

## 2018-07-12 DIAGNOSIS — R42 Dizziness and giddiness: Secondary | ICD-10-CM | POA: Diagnosis not present

## 2018-07-12 DIAGNOSIS — O26893 Other specified pregnancy related conditions, third trimester: Secondary | ICD-10-CM | POA: Insufficient documentation

## 2018-07-12 DIAGNOSIS — O368130 Decreased fetal movements, third trimester, not applicable or unspecified: Secondary | ICD-10-CM

## 2018-07-12 DIAGNOSIS — Z3A28 28 weeks gestation of pregnancy: Secondary | ICD-10-CM | POA: Diagnosis not present

## 2018-07-12 LAB — CBC
HCT: 31.5 % — ABNORMAL LOW (ref 36.0–46.0)
HEMOGLOBIN: 10.5 g/dL — AB (ref 12.0–15.0)
MCH: 30.3 pg (ref 26.0–34.0)
MCHC: 33.3 g/dL (ref 30.0–36.0)
MCV: 90.8 fL (ref 80.0–100.0)
Platelets: 283 10*3/uL (ref 150–400)
RBC: 3.47 MIL/uL — ABNORMAL LOW (ref 3.87–5.11)
RDW: 13 % (ref 11.5–15.5)
WBC: 8 10*3/uL (ref 4.0–10.5)
nRBC: 0 % (ref 0.0–0.2)

## 2018-07-12 LAB — URINALYSIS, ROUTINE W REFLEX MICROSCOPIC
Bilirubin Urine: NEGATIVE
Glucose, UA: NEGATIVE mg/dL
Hgb urine dipstick: NEGATIVE
Ketones, ur: 5 mg/dL — AB
Nitrite: NEGATIVE
Protein, ur: NEGATIVE mg/dL
Specific Gravity, Urine: 1.014 (ref 1.005–1.030)
pH: 8 (ref 5.0–8.0)

## 2018-07-12 LAB — GLUCOSE, CAPILLARY: GLUCOSE-CAPILLARY: 71 mg/dL (ref 70–99)

## 2018-07-12 NOTE — Discharge Instructions (Signed)

## 2018-07-12 NOTE — MAU Provider Note (Signed)
History     CSN: [id number]  Arrival date and time: 07/12/18 1501   First Provider Initiated Contact with Patient 07/12/18 1612      Chief Complaint  Patient presents with  . Decreased Fetal Movement   HPI Julie [name] is a 23 y.o. G1P0 at [redacted]w[redacted]d who presents with decreased fetal movement and dizziness. She states she ate cereal for breakfast and did not eat anything else the rest of the day. She states she had not felt the baby move since this morning but reports normal fetal movement since her arrival in MAU. She denies any leaking or bleeding. Denies any pain.   OB History    Gravida  1   Para      Term      Preterm      AB      Living        SAB      TAB      Ectopic      Multiple      Live Births              Past Medical History:  Diagnosis Date  . Anxiety   . Asthma   . Depression     History reviewed. No pertinent surgical history.  Family History  Problem Relation Age of Onset  . Bipolar disorder Father   . Alcohol abuse Paternal Grandmother     Social History   Tobacco Use  . Smoking status: Never Smoker  . Smokeless tobacco: Never Used  Substance Use Topics  . Alcohol use: No    Comment: hx of substance abuse.Denies all now  . Drug use: No    Comment: History of drug use/clean 3 months    Allergies: No Known Allergies  Medications Prior to Admission  Medication Sig Dispense Refill Last Dose  . folic acid (FOLVITE) 1 MG tablet Take 1 mg by mouth every morning.  0 07/11/2018 at Unknown time  . Prenatal Vit-Fe Fumarate-FA (PRENATAL MULTIVITAMIN) TABS tablet Take 1 tablet by mouth daily at 12 noon.   07/11/2018 at Unknown time  . QUEtiapine (SEROQUEL XR) 400 MG 24 hr tablet Take 1 tablet (400 mg total) by mouth at bedtime. 30 tablet 2 07/11/2018 at Unknown time    Review of Systems  Constitutional: Negative.  Negative for fatigue and fever.  HENT: Negative.   Respiratory: Negative.  Negative for shortness of breath.    Cardiovascular: Negative.  Negative for chest pain.  Gastrointestinal: Negative.  Negative for abdominal pain, constipation, diarrhea, nausea and vomiting.  Genitourinary: Negative.  Negative for dysuria, vaginal bleeding and vaginal discharge.  Neurological: Positive for dizziness. Negative for headaches.   Physical Exam   Blood pressure 109/65, pulse 98, temperature 98.4 F (36.9 C), resp. rate 12, last menstrual period 12/07/2017, SpO2 98 %.  Physical Exam  Nursing note and vitals reviewed. Constitutional: She is oriented to person, place, and time. She appears well-developed and well-nourished. No distress.  HENT:  Head: Normocephalic.  Eyes: Pupils are equal, round, and reactive to light.  Cardiovascular: Normal rate, regular rhythm and normal heart sounds.  Respiratory: Effort normal and breath sounds normal. No respiratory distress.  GI: Soft. Bowel sounds are normal. She exhibits no distension. There is no abdominal tenderness.  Neurological: She is alert and oriented to person, place, and time.  Skin: Skin is warm and dry.  Psychiatric: She has a normal mood and affect. Her behavior is normal. Judgment and thought content normal.  Fetal Tracing:  Baseline: 135 Variability: moderate  Accels:10x10 Decels: none  Toco: 3 uc's, resolved   MAU Course  Procedures Results for orders placed or performed during the hospital encounter of 07/12/18 (from the past 24 hour(s))  Glucose, capillary     Status: None   Collection Time: 07/12/18  3:36 PM  Result Value Ref Range   Glucose-Capillary 71 70 - 99 mg/dL  Urinalysis, Routine w reflex microscopic     Status: Abnormal   Collection Time: 07/12/18  4:10 PM  Result Value Ref Range   Color, Urine YELLOW YELLOW   APPearance CLOUDY (A) CLEAR   Specific Gravity, Urine 1.014 1.005 - 1.030   pH 8.0 5.0 - 8.0   Glucose, UA NEGATIVE NEGATIVE mg/dL   Hgb urine dipstick NEGATIVE NEGATIVE   Bilirubin Urine NEGATIVE NEGATIVE    Ketones, ur 5 (A) NEGATIVE mg/dL   Protein, ur NEGATIVE NEGATIVE mg/dL   Nitrite NEGATIVE NEGATIVE   Leukocytes, UA SMALL (A) NEGATIVE   RBC / HPF 0-5 0 - 5 RBC/hpf   WBC, UA 6-10 0 - 5 WBC/hpf   Bacteria, UA FEW (A) NONE SEEN   Squamous Epithelial / LPF 0-5 0 - 5   Amorphous Crystal PRESENT   CBC     Status: Abnormal   Collection Time: 07/12/18  4:19 PM  Result Value Ref Range   WBC 8.0 4.0 - 10.5 K/uL   RBC 3.47 (L) 3.87 - 5.11 MIL/uL   Hemoglobin 10.5 (L) 12.0 - 15.0 g/dL   HCT 33.7 (L) 44.5 - 14.6 %   MCV 90.8 80.0 - 100.0 fL   MCH 30.3 26.0 - 34.0 pg   MCHC 33.3 30.0 - 36.0 g/dL   RDW 04.7 99.8 - 72.1 %   Platelets 283 150 - 400 K/uL   nRBC 0.0 0.0 - 0.2 %   MDM UA CBC CBG PO hydration  Patient reports normal fetal movement. Lengthy discussion with patient about food choices in pregnancy including high protein foods and snacks  Assessment and Plan   1. Movement of fetus present during pregnancy in third trimester   2. Dizziness   3. [redacted] weeks gestation of pregnancy    -Discharge home in stable condition -Kick count precautions discussed -Patient advised to follow-[facility] with [facility] as scheduled for prenatal care -Patient may return to MAU as needed or if her condition were to change or worsen  Rolm Bookbinder CNM 07/12/2018, 4:12 PM

## 2018-07-12 NOTE — MAU Note (Signed)
Pt reports feeling light headed yesterday that has continued into today.   Pt reports a headache.  Pt reports not feeling the baby move since last night at 1000.   Denies vaginal bleeding or LOF.

## 2018-08-03 ENCOUNTER — Other Ambulatory Visit: Payer: Self-pay | Admitting: Psychiatry

## 2018-08-09 DIAGNOSIS — O99019 Anemia complicating pregnancy, unspecified trimester: Secondary | ICD-10-CM | POA: Diagnosis not present

## 2018-08-27 ENCOUNTER — Other Ambulatory Visit: Payer: Self-pay | Admitting: Psychiatry

## 2018-09-04 ENCOUNTER — Encounter: Payer: Self-pay | Admitting: Pediatrics

## 2018-09-04 ENCOUNTER — Ambulatory Visit (INDEPENDENT_AMBULATORY_CARE_PROVIDER_SITE_OTHER): Payer: Self-pay | Admitting: Pediatrics

## 2018-09-04 DIAGNOSIS — Z7681 Expectant parent(s) prebirth pediatrician visit: Secondary | ICD-10-CM

## 2018-09-04 NOTE — Progress Notes (Signed)
Prenatal counseling for impending newborn done-- Z76.81  

## 2018-09-06 DIAGNOSIS — Z3480 Encounter for supervision of other normal pregnancy, unspecified trimester: Secondary | ICD-10-CM | POA: Diagnosis not present

## 2018-09-09 ENCOUNTER — Encounter (HOSPITAL_COMMUNITY): Payer: Self-pay

## 2018-09-09 ENCOUNTER — Other Ambulatory Visit: Payer: Self-pay

## 2018-09-09 ENCOUNTER — Inpatient Hospital Stay (HOSPITAL_COMMUNITY)
Admission: AD | Admit: 2018-09-09 | Discharge: 2018-09-09 | Disposition: A | Discharge status: Home / Self Care | Payer: Medicaid Other | Attending: Obstetrics and Gynecology | Admitting: Obstetrics and Gynecology

## 2018-09-09 DIAGNOSIS — Z3A37 37 weeks gestation of pregnancy: Secondary | ICD-10-CM

## 2018-09-09 DIAGNOSIS — Z3689 Encounter for other specified antenatal screening: Secondary | ICD-10-CM

## 2018-09-09 DIAGNOSIS — Z0371 Encounter for suspected problem with amniotic cavity and membrane ruled out: Secondary | ICD-10-CM

## 2018-09-09 NOTE — Discharge Instructions (Signed)

## 2018-09-09 NOTE — MAU Note (Signed)
Presents with c/o LOF this afternoon @ 1200, has continued to leak, fluid clear.  Denies VB.  Reports +FM.

## 2018-09-09 NOTE — MAU Provider Note (Signed)
First Provider Initiated Contact with Patient 09/09/18 1359     S: Ms. Julie [name] is a 23 y.o. G1P0 at [redacted]w[redacted]d  who presents to MAU today complaining of leaking of fluid since about 11am today. Patient states she was getting dressed for work, "hopping up and down to get my pants on" when she felt as if she passed fluid. She denies gush of fluid, ongoing leaking, abdominal pain. She reports seeing a small amount of pink-tinged mucus just after she got dressed this morning. She reports normal fetal movement.    O: BP 132/82   Pulse (!) 114   Temp 98.5 F (36.9 C) (Oral)   Resp 20   Ht 5\' 5"  (1.651 m)   Wt 85.1 kg   LMP 12/07/2017   SpO2 97%   BMI 31.21 kg/m  GENERAL: Well-developed, well-nourished female in no acute distress.  HEAD: Normocephalic, atraumatic.  CHEST: Normal effort of breathing, regular heart rate ABDOMEN: Soft, nontender, gravid  Cervical exam: 1cm  (unchanged from clinic)  Fetal Monitoring: Baseline: 140 Variability: moderate Accelerations: positive Decelerations: none Contractions: occasional, UI   A: SIUP at [redacted]w[redacted]d  Negative pooling Negative Fern No cervical change  P: Discharge home in stable condition   Calvert Cantor, [place] 09/09/2018 2:35 PM

## 2018-09-19 ENCOUNTER — Other Ambulatory Visit: Payer: Self-pay | Admitting: Psychiatry

## 2018-09-19 DIAGNOSIS — F3173 Bipolar disorder, in partial remission, most recent episode manic: Secondary | ICD-10-CM

## 2018-09-20 NOTE — Telephone Encounter (Signed)
Phone review with patient who is a month overdue for follow-up from appointment here in November providing the Seroquel 400 mg ER escription doses1 tablet nightly planning to reduce the dose in third trimester.  She is currently going to the [facility] unit weekly for monitoring apparently now at 39 weeks not yet eminent to deliver by their assessment.  He does not plan to return here before delivery but is doing well psychologically having 4 tablets remaining of the 400 mg ER that she may use 1/2 tablet tonight and I will E scribe the 200 mg ER tablet to [facility] [address] as [facility] month supply start tomorrow night use less dosing in third trimester especially before delivery to have less fetal medication effect.

## 2018-09-20 NOTE — Telephone Encounter (Signed)
Still no follow up scheduled 

## 2018-09-24 ENCOUNTER — Telehealth: Payer: Self-pay | Admitting: Psychiatry

## 2018-09-24 ENCOUNTER — Telehealth: Payer: Self-pay

## 2018-09-24 NOTE — Telephone Encounter (Signed)
Called [place] Medicaid for prior authorization and safety documentation for quetiapine XR 200mg , approved effective 09/24/2018-09/19/2019  BT#[id number]

## 2018-09-24 NOTE — Telephone Encounter (Signed)
[facility] is at [facility] [phone] and will need a PA.

## 2018-09-24 NOTE — Telephone Encounter (Signed)
PA completed approved 09/24/2018-09/19/2019 through [facility] Medicaid BO#[id number]

## 2018-09-25 ENCOUNTER — Inpatient Hospital Stay (HOSPITAL_COMMUNITY): Payer: Medicaid Other

## 2018-09-25 ENCOUNTER — Other Ambulatory Visit: Payer: Self-pay

## 2018-09-25 ENCOUNTER — Inpatient Hospital Stay (HOSPITAL_COMMUNITY)
Admission: AD | Admit: 2018-09-25 | Discharge: 2018-09-28 | DRG: 807 | Disposition: A | Discharge status: Home / Self Care | Payer: Medicaid Other | Attending: Obstetrics and Gynecology | Admitting: Obstetrics and Gynecology

## 2018-09-25 ENCOUNTER — Encounter (HOSPITAL_COMMUNITY): Payer: Self-pay | Admitting: *Deleted

## 2018-09-25 DIAGNOSIS — Z3A39 39 weeks gestation of pregnancy: Secondary | ICD-10-CM | POA: Diagnosis not present

## 2018-09-25 DIAGNOSIS — O43123 Velamentous insertion of umbilical cord, third trimester: Secondary | ICD-10-CM | POA: Diagnosis present

## 2018-09-25 DIAGNOSIS — O4693 Antepartum hemorrhage, unspecified, third trimester: Secondary | ICD-10-CM | POA: Diagnosis present

## 2018-09-25 DIAGNOSIS — F319 Bipolar disorder, unspecified: Secondary | ICD-10-CM | POA: Diagnosis present

## 2018-09-25 DIAGNOSIS — O99824 Streptococcus B carrier state complicating childbirth: Secondary | ICD-10-CM | POA: Diagnosis present

## 2018-09-25 DIAGNOSIS — O4593 Premature separation of placenta, unspecified, third trimester: Secondary | ICD-10-CM | POA: Diagnosis present

## 2018-09-25 DIAGNOSIS — O99344 Other mental disorders complicating childbirth: Secondary | ICD-10-CM | POA: Diagnosis present

## 2018-09-25 LAB — CBC
HCT: 35.9 % — ABNORMAL LOW (ref 36.0–46.0)
Hemoglobin: 11.5 g/dL — ABNORMAL LOW (ref 12.0–15.0)
MCH: 28.4 pg (ref 26.0–34.0)
MCHC: 32 g/dL (ref 30.0–36.0)
MCV: 88.6 fL (ref 80.0–100.0)
Platelets: 215 10*3/uL (ref 150–400)
RBC: 4.05 MIL/uL (ref 3.87–5.11)
RDW: 13.2 % (ref 11.5–15.5)
WBC: 8.1 10*3/uL (ref 4.0–10.5)
nRBC: 0 % (ref 0.0–0.2)

## 2018-09-25 LAB — TYPE AND SCREEN
ABO/RH(D): O POS
Antibody Screen: NEGATIVE

## 2018-09-25 MED ORDER — ZOLPIDEM TARTRATE 5 MG PO TABS: 5.0000 mg | ORAL_TABLET | Freq: Every evening | ORAL | Status: DC | PRN

## 2018-09-25 MED ORDER — DOCUSATE SODIUM 100 MG PO CAPS: 100.0000 mg | ORAL_CAPSULE | Freq: Every day | ORAL | Status: DC

## 2018-09-25 MED ORDER — LACTATED RINGERS IV SOLN: INTRAVENOUS | Status: DC

## 2018-09-25 MED ORDER — PRENATAL MULTIVITAMIN CH: 1.0000 | ORAL_TABLET | Freq: Every day | ORAL | Status: DC

## 2018-09-25 MED ORDER — ACETAMINOPHEN 325 MG PO TABS
650.0000 mg | ORAL_TABLET | ORAL | Status: DC | PRN
  Administered 2018-09-25: 650 mg via ORAL
  Filled 2018-09-25: qty 2

## 2018-09-25 MED ORDER — CALCIUM CARBONATE ANTACID 500 MG PO CHEW: 2.0000 | CHEWABLE_TABLET | ORAL | Status: DC | PRN

## 2018-09-25 NOTE — MAU Provider Note (Signed)
History     CSN: [id number]  Arrival date and time: 09/25/18 2137   First Provider Initiated Contact with Patient 09/25/18 2230      Chief Complaint  Patient presents with  . Vaginal Bleeding   HPI  Ms.  Julie [name] is a 23 y.o. year old G1P0 female at [redacted]w[redacted]d weeks gestation who presents to MAU reporting feeling pressure like she had to go to the BR @ ~2100 tonight. She reports that when she got to the BR, she noticed a lot of blood in her pants and in the toilet. She wore one pad here that was saturated and the one she has on now is moderately soaked with BRB. She started having UC's "jusst before arriving here."  Past Medical History:  Diagnosis Date  . Anxiety   . Asthma   . Depression     Past Surgical History:  Procedure Laterality Date  . NO PAST SURGERIES      Family History  Problem Relation Age of Onset  . Bipolar disorder Father   . Alcohol abuse Paternal Grandmother     Social History   Tobacco Use  . Smoking status: Never Smoker  . Smokeless tobacco: Never Used  Substance Use Topics  . Alcohol use: No    Comment: hx of substance abuse.Denies all now  . Drug use: No    Comment: History of drug use/clean 3 months    Allergies: No Known Allergies  Medications Prior to Admission  Medication Sig Dispense Refill Last Dose  . folic acid (FOLVITE) 1 MG tablet Take 1 mg by mouth every morning.  0 09/24/2018 at Unknown time  . Prenatal Vit-Fe Fumarate-FA (PRENATAL MULTIVITAMIN) TABS tablet Take 1 tablet by mouth daily at 12 noon.   09/24/2018 at Unknown time  . QUEtiapine (SEROQUEL XR) 200 MG 24 hr tablet Take 1 tablet (200 mg total) by mouth at bedtime. 30 tablet 0 09/24/2018 at Unknown time    Review of Systems  Constitutional:       Mild distress and breathing through UC's  HENT: Negative.   Eyes: Negative.   Respiratory: Negative.   Cardiovascular: Negative.   Gastrointestinal: Positive for abdominal pain (UC's since arriving to MAU).   Endocrine: Negative.   Genitourinary: Positive for vaginal bleeding (heavy since 2100).  Musculoskeletal: Negative.   Skin: Negative.   Allergic/Immunologic: Negative.   Neurological: Negative.   Hematological: Negative.   Psychiatric/Behavioral: Negative.    Physical Exam   Blood pressure 134/79, pulse 97, temperature 99.4 F (37.4 C), temperature source Oral, resp. rate 16, height 5\' 5"  (1.651 m), weight 87.1 kg, last menstrual period 12/07/2017.  Physical Exam  Nursing note and vitals reviewed. Constitutional: She is oriented to person, place, and time. She appears well-developed and well-nourished.  HENT:  Head: Normocephalic and atraumatic.  Eyes: Pupils are equal, round, and reactive to light.  Neck: Normal range of motion.  Cardiovascular: Normal rate.  Respiratory: Effort normal.  GI: Soft.  Abdomen soft in between UC's  Genitourinary:    Genitourinary Comments: Speculum Exam: large amount of BRB and large blood clots filled speculum, removed with 5-6 large cotton swabs and ring forceps, some mucous mixed in with blood, but majority blood and clots seen in vaginal vault // VE after U/S: Dilation: 2 Effacement (%): 70 Station: -2 Presentation: Vertex Exam by: Raelyn Mora CNM     Musculoskeletal: Normal range of motion.  Neurological: She is alert and oriented to person, place, and time. She has  normal reflexes.  Skin: Skin is warm and dry.  Psychiatric: She has a normal mood and affect. Her behavior is normal. Judgment and thought content normal.   NST - FHR: 140 bpm / moderate variability / accels present / decels absent / TOCO: regular every 1-3 mins with UI noted  MAU Course  Procedures  MDM CEFM OB MFM Limited U/S (per preliminary): portions of placenta obscured by fetal parts = sub-optimal placental assessment oligohydramnios (AFI = 5.29 cm) / cephalic presentation   *Consult with Dr. Claiborne Billings @ 2300 - notified of patient's complaints, assessments, lab &  U/S results, tx plan admission to L&D -- Dr. Claiborne Billings orders for patient to be admitted for 23 hrs observation and CEFM, no other orders at this time  Assessment and Plan  Vaginal bleeding in pregnancy, third trimester - Admit to OBSCU - IV: LR 1000 ml @ 125 ml/hr - NPO - Dr. Claiborne Billings to assume care of patient upon admission  Raelyn Mora, MSN, CNM 09/25/2018, 10:32 PM

## 2018-09-25 NOTE — H&P (Addendum)
23 y.o. [redacted]w[redacted]d  G1P0 comes in vaginal bleeding that was substantial and spontaneous.  Per CNM pt was sitting on the couch and felt pressure, went to bathroom and noted a lot of bleeding so came directly for evaulation.   Pt reports having her membranes stripped in the office 09/24/2018.  In MAU, small amount of red blood noted on pad in triage, per CNM at least 5-6 fox swabs needed to clear blood and clot obscuring view.  Addendum: per CNM note initial pad upon presentation was soaked.  Past Medical History:  Diagnosis Date  . Anxiety   . Asthma   . Depression     Past Surgical History:  Procedure Laterality Date  . NO PAST SURGERIES      OB History  Gravida Para Term Preterm AB Living  1            SAB TAB Ectopic Multiple Live Births               # Outcome Date GA Lbr Len/2nd Weight Sex Delivery Anes PTL Lv  1 Current             Social History   Socioeconomic History  . Marital status: Single    Spouse name: Not on file  . Number of children: Not on file  . Years of education: Not on file  . Highest education level: Not on file  Occupational History  . 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 Financial resource strain: Not on file  . Food insecurity:    Worry: Not on file    Inability: Not on file  . Transportation needs:    Medical: Not on file    Non-medical: Not on file  Tobacco Use  . Smoking status: Never Smoker  . Smokeless tobacco: Never Used  Substance and Sexual Activity  . Alcohol use: No    Comment: hx of substance abuse.Denies all now  . Drug use: No    Comment: History of drug use/clean 3 months  . Sexual activity: Yes    Partners: Male    Birth control/protection: None  Lifestyle  . Physical activity:    Days per week: Not on file    Minutes per session: Not on file  . Stress: Not on file  Relationships  . Social connections:    Talks on phone: Not on file    Gets together: Not  on file    Attends religious service: Not on file    Active member of club or organization: Not on file    Attends meetings of clubs or organizations: Not on file    Relationship status: Not on file  . Intimate partner violence:    Fear of current or ex partner: Not on file    Emotionally abused: Not on file    Physically abused: Not on file    Forced sexual activity: Not on file  Other Topics Concern  . Not on file  Social History Narrative  . Not on file   Patient has no known allergies.    Prenatal Transfer Tool  Maternal Diabetes: No Genetic Screening: Normal Maternal Ultrasounds/Referrals: Normal Fetal Ultrasounds or other Referrals:  None Maternal Substance Abuse:  No Significant Maternal Medications:  Meds include: Other:  Seroquel Significant Maternal Lab Results: Lab values include: Group B Strep positive  Other PNC: bipolar disorder, well controlled on meds    Vitals:  09/25/18 2148 09/25/18 2326  BP: 134/79 112/78  Pulse: 97 99  Resp: 16 20  Temp: 99.4 F (37.4 C) 99.1 F (37.3 C)  TempSrc: Oral Oral  SpO2:  97%  Weight: 87.1 kg   Height: 5\' 5"  (1.651 m)     Exam per MAU staff: SVE:  2/70/-2 FHTs:  140, good STV, NST R; Cat 1 tracing. Toco: q1-3 then Irregularly traced Per CNM (report not yet visible to me in Epic)- oligo found on [place], she stated AFI 5.29 and DVP 3+, suboptimal view of placenta.  Both of these are technically low normal, but bordering on oligo.  Spoke with RN who stated amnisure not possible given amount of bleeding.   A/P   Admit with term contractions and vaginal bleeding at 39.4  GBS Pos- PCN  Continuous monitoring- Cat 1 on my review of FHT  Other routine care.  Philip Aspen

## 2018-09-25 NOTE — MAU Note (Signed)
PT SAYS UC STRONG - X 30 MIN . VAG BLEEDING STARTED AT 9PM-  PAD ON IN TRIAGE- SMALL AMT  RED - . PNC  WITH  DR Chestine Spore.   DENIES HSV AND MRSA.  GBS- POSITIVE.

## 2018-09-26 ENCOUNTER — Encounter (HOSPITAL_COMMUNITY): Payer: Self-pay | Admitting: Obstetrics and Gynecology

## 2018-09-26 ENCOUNTER — Inpatient Hospital Stay (HOSPITAL_COMMUNITY): Payer: Medicaid Other | Admitting: Anesthesiology

## 2018-09-26 ENCOUNTER — Other Ambulatory Visit: Payer: Self-pay

## 2018-09-26 LAB — RPR: RPR Ser Ql: NONREACTIVE

## 2018-09-26 MED ORDER — PHENYLEPHRINE 40 MCG/ML (10ML) SYRINGE FOR IV PUSH (FOR BLOOD PRESSURE SUPPORT): 80.0000 ug | PREFILLED_SYRINGE | INTRAVENOUS | Status: DC | PRN

## 2018-09-26 MED ORDER — EPHEDRINE 5 MG/ML INJ: 10.0000 mg | INTRAVENOUS | Status: DC | PRN

## 2018-09-26 MED ORDER — METHYLERGONOVINE MALEATE 0.2 MG/ML IJ SOLN: 0.2000 mg | INTRAMUSCULAR | Status: DC | PRN

## 2018-09-26 MED ORDER — DIPHENHYDRAMINE HCL 50 MG/ML IJ SOLN: 12.5000 mg | INTRAMUSCULAR | Status: DC | PRN

## 2018-09-26 MED ORDER — LACTATED RINGERS IV SOLN: 500.0000 mL | INTRAVENOUS | Status: DC | PRN

## 2018-09-26 MED ORDER — OXYCODONE-ACETAMINOPHEN 5-325 MG PO TABS: 2.0000 | ORAL_TABLET | ORAL | Status: DC | PRN

## 2018-09-26 MED ORDER — BENZOCAINE-MENTHOL 20-0.5 % EX AERO: 1.0000 "application " | INHALATION_SPRAY | CUTANEOUS | Status: DC | PRN

## 2018-09-26 MED ORDER — FLEET ENEMA 7-19 GM/118ML RE ENEM: 1.0000 | ENEMA | RECTAL | Status: DC | PRN

## 2018-09-26 MED ORDER — SENNOSIDES-DOCUSATE SODIUM 8.6-50 MG PO TABS
2.0000 | ORAL_TABLET | ORAL | Status: DC
  Administered 2018-09-27 (×2): 2 via ORAL
  Filled 2018-09-26 (×2): qty 2

## 2018-09-26 MED ORDER — LACTATED RINGERS IV SOLN: 500.0000 mL | Freq: Once | INTRAVENOUS | Status: DC

## 2018-09-26 MED ORDER — OXYTOCIN 40 UNITS IN NORMAL SALINE INFUSION - SIMPLE MED
1.0000 m[IU]/min | INTRAVENOUS | Status: DC
  Administered 2018-09-26: 2 m[IU]/min via INTRAVENOUS
  Filled 2018-09-26: qty 1000

## 2018-09-26 MED ORDER — TERBUTALINE SULFATE 1 MG/ML IJ SOLN: 0.2500 mg | Freq: Once | INTRAMUSCULAR | Status: DC | PRN

## 2018-09-26 MED ORDER — ONDANSETRON HCL 4 MG PO TABS: 4.0000 mg | ORAL_TABLET | ORAL | Status: DC | PRN

## 2018-09-26 MED ORDER — ACETAMINOPHEN 325 MG PO TABS: 650.0000 mg | ORAL_TABLET | ORAL | Status: DC | PRN

## 2018-09-26 MED ORDER — TETANUS-DIPHTH-ACELL PERTUSSIS 5-2.5-18.5 LF-MCG/0.5 IM SUSP: 0.5000 mL | Freq: Once | INTRAMUSCULAR | Status: DC

## 2018-09-26 MED ORDER — FENTANYL-BUPIVACAINE-NACL 0.5-0.125-0.9 MG/250ML-% EP SOLN
12.0000 mL/h | EPIDURAL | Status: DC | PRN
  Filled 2018-09-26: qty 250

## 2018-09-26 MED ORDER — DIPHENHYDRAMINE HCL 25 MG PO CAPS: 25.0000 mg | ORAL_CAPSULE | Freq: Four times a day (QID) | ORAL | Status: DC | PRN

## 2018-09-26 MED ORDER — FENTANYL CITRATE (PF) 100 MCG/2ML IJ SOLN: 50.0000 ug | INTRAMUSCULAR | Status: DC | PRN

## 2018-09-26 MED ORDER — SIMETHICONE 80 MG PO CHEW: 80.0000 mg | CHEWABLE_TABLET | ORAL | Status: DC | PRN

## 2018-09-26 MED ORDER — PRENATAL MULTIVITAMIN CH
1.0000 | ORAL_TABLET | Freq: Every day | ORAL | Status: DC
  Administered 2018-09-27 – 2018-09-28 (×2): 1 via ORAL
  Filled 2018-09-26 (×2): qty 1

## 2018-09-26 MED ORDER — IBUPROFEN 600 MG PO TABS
600.0000 mg | ORAL_TABLET | Freq: Four times a day (QID) | ORAL | Status: DC
  Administered 2018-09-26 – 2018-09-28 (×7): 600 mg via ORAL
  Filled 2018-09-26 (×8): qty 1

## 2018-09-26 MED ORDER — DIBUCAINE 1 % RE OINT: 1.0000 "application " | TOPICAL_OINTMENT | RECTAL | Status: DC | PRN

## 2018-09-26 MED ORDER — LACTATED RINGERS IV SOLN: INTRAVENOUS | Status: DC

## 2018-09-26 MED ORDER — LIDOCAINE HCL (PF) 1 % IJ SOLN
30.0000 mL | INTRAMUSCULAR | Status: AC | PRN
  Administered 2018-09-26: 30 mL via SUBCUTANEOUS
  Filled 2018-09-26: qty 30

## 2018-09-26 MED ORDER — WITCH HAZEL-GLYCERIN EX PADS: 1.0000 "application " | MEDICATED_PAD | CUTANEOUS | Status: DC | PRN

## 2018-09-26 MED ORDER — SODIUM CHLORIDE (PF) 0.9 % IJ SOLN
INTRAMUSCULAR | Status: DC | PRN
  Administered 2018-09-26: 12 mL/h via EPIDURAL

## 2018-09-26 MED ORDER — ONDANSETRON HCL 4 MG/2ML IJ SOLN: 4.0000 mg | INTRAMUSCULAR | Status: DC | PRN

## 2018-09-26 MED ORDER — OXYCODONE-ACETAMINOPHEN 5-325 MG PO TABS: 1.0000 | ORAL_TABLET | ORAL | Status: DC | PRN

## 2018-09-26 MED ORDER — SODIUM CHLORIDE 0.9 % IV SOLN
5.0000 10*6.[IU] | Freq: Once | INTRAVENOUS | Status: AC
  Administered 2018-09-26 (×2): 5 10*6.[IU] via INTRAVENOUS
  Filled 2018-09-26: qty 5

## 2018-09-26 MED ORDER — ONDANSETRON HCL 4 MG/2ML IJ SOLN: 4.0000 mg | Freq: Four times a day (QID) | INTRAMUSCULAR | Status: DC | PRN

## 2018-09-26 MED ORDER — ZOLPIDEM TARTRATE 5 MG PO TABS: 5.0000 mg | ORAL_TABLET | Freq: Every evening | ORAL | Status: DC | PRN

## 2018-09-26 MED ORDER — QUETIAPINE FUMARATE 25 MG PO TABS
200.0000 mg | ORAL_TABLET | Freq: Every day | ORAL | Status: DC
  Administered 2018-09-27: 200 mg via ORAL
  Filled 2018-09-26 (×2): qty 8

## 2018-09-26 MED ORDER — LIDOCAINE-EPINEPHRINE (PF) 2 %-1:200000 IJ SOLN
INTRAMUSCULAR | Status: DC | PRN
  Administered 2018-09-26: 5 mL via EPIDURAL
  Administered 2018-09-26: 3 mL via EPIDURAL

## 2018-09-26 MED ORDER — COCONUT OIL OIL: 1.0000 "application " | TOPICAL_OIL | Status: DC | PRN

## 2018-09-26 MED ORDER — OXYTOCIN BOLUS FROM INFUSION
500.0000 mL | Freq: Once | INTRAVENOUS | Status: AC
  Administered 2018-09-26: 500 mL via INTRAVENOUS

## 2018-09-26 MED ORDER — METHYLERGONOVINE MALEATE 0.2 MG PO TABS: 0.2000 mg | ORAL_TABLET | ORAL | Status: DC | PRN

## 2018-09-26 MED ORDER — SOD CITRATE-CITRIC ACID 500-334 MG/5ML PO SOLN: 30.0000 mL | ORAL | Status: DC | PRN

## 2018-09-26 MED ORDER — OXYTOCIN 40 UNITS IN NORMAL SALINE INFUSION - SIMPLE MED: 2.5000 [IU]/h | INTRAVENOUS | Status: DC

## 2018-09-26 MED ORDER — PENICILLIN G 3 MILLION UNITS IVPB - SIMPLE MED
3.0000 10*6.[IU] | INTRAVENOUS | Status: DC
  Administered 2018-09-26 (×2): 3 10*6.[IU] via INTRAVENOUS
  Filled 2018-09-26 (×2): qty 100

## 2018-09-26 NOTE — Anesthesia Preprocedure Evaluation (Signed)
Anesthesia Evaluation  Patient identified by MRN, date of birth, ID band Patient awake    Reviewed: Allergy & Precautions, H&P , NPO status , Patient's Chart, lab work & pertinent test results  History of Anesthesia Complications Negative for: history of anesthetic complications  Airway Mallampati: II  TM Distance: >3 FB Neck ROM: full    Dental no notable dental hx.    Pulmonary neg pulmonary ROS,    Pulmonary exam normal        Cardiovascular negative cardio ROS Normal cardiovascular exam Rhythm:regular Rate:Normal     Neuro/Psych PSYCHIATRIC DISORDERS Anxiety Depression Bipolar Disorder negative neurological ROS     GI/Hepatic negative GI ROS, Neg liver ROS,   Endo/Other  negative endocrine ROS  Renal/GU negative Renal ROS  negative genitourinary   Musculoskeletal   Abdominal   Peds  Hematology negative hematology ROS (+)   Anesthesia Other Findings   Reproductive/Obstetrics (+) Pregnancy                             Anesthesia Physical Anesthesia Plan  ASA: II  Anesthesia Plan: Epidural   Post-op Pain Management:    Induction:   PONV Risk Score and Plan:   Airway Management Planned:   Additional Equipment:   Intra-op Plan:   Post-operative Plan:   Informed Consent: I have reviewed the patients History and Physical, chart, labs and discussed the procedure including the risks, benefits and alternatives for the proposed anesthesia with the patient or authorized representative who has indicated his/her understanding and acceptance.       Plan Discussed with:   Anesthesia Plan Comments:         Anesthesia Quick Evaluation

## 2018-09-26 NOTE — Anesthesia Procedure Notes (Signed)
Epidural Patient location during procedure: OB Start time: 09/26/2018 4:36 AM End time: 09/26/2018 4:49 AM  Staffing Anesthesiologist: Lucretia Kern, MD Performed: anesthesiologist   Preanesthetic Checklist Completed: patient identified, pre-op evaluation, timeout performed, IV checked, risks and benefits discussed and monitors and equipment checked  Epidural Patient position: sitting Prep: DuraPrep Patient monitoring: heart rate, continuous pulse ox and blood pressure Approach: midline Location: L3-L4 Injection technique: LOR air  Needle:  Needle type: Tuohy  Needle gauge: 17 G Needle length: 9 cm Needle insertion depth: 5 cm Catheter type: closed end flexible Catheter size: 19 Gauge Catheter at skin depth: 10 cm Test dose: negative and 2% lidocaine with Epi 1:200 K  Assessment Events: blood not aspirated, injection not painful, no injection resistance, negative IV test and no paresthesia  Additional Notes Reason for block:procedure for pain

## 2018-09-26 NOTE — Anesthesia Postprocedure Evaluation (Signed)
Anesthesia Post Note  Patient: Julie [name]  Procedure(s) Performed: AN AD HOC LABOR EPIDURAL     Patient location during evaluation: Mother Baby Anesthesia Type: Epidural Level of consciousness: awake Pain management: satisfactory to patient Vital Signs Assessment: post-procedure vital signs reviewed and stable Respiratory status: spontaneous breathing Cardiovascular status: stable Anesthetic complications: no    Last Vitals:  Vitals:   09/26/18 1400 09/26/18 1500  BP: 130/81 135/79  Pulse: 84 87  Resp: 19 17  Temp: 36.8 C 36.9 C  SpO2: 98% 99%    Last Pain:  Vitals:   09/26/18 1500  TempSrc: Oral  PainSc:    Pain Goal: Patients Stated Pain Goal: 2 (09/25/18 2320)                 Cephus Shelling

## 2018-09-27 LAB — CBC
HCT: 30.8 % — ABNORMAL LOW (ref 36.0–46.0)
Hemoglobin: 10.2 g/dL — ABNORMAL LOW (ref 12.0–15.0)
MCH: 29.5 pg (ref 26.0–34.0)
MCHC: 33.1 g/dL (ref 30.0–36.0)
MCV: 89 fL (ref 80.0–100.0)
NRBC: 0 % (ref 0.0–0.2)
Platelets: 171 10*3/uL (ref 150–400)
RBC: 3.46 MIL/uL — ABNORMAL LOW (ref 3.87–5.11)
RDW: 13.5 % (ref 11.5–15.5)
WBC: 10.4 10*3/uL (ref 4.0–10.5)

## 2018-09-27 NOTE — Progress Notes (Signed)
Patient is doing well.  She is ambulating, voiding, tolerating PO.  Pain control is good.  Lochia is appropriate  Vitals:   09/26/18 1500 09/26/18 1930 09/27/18 0000 09/27/18 0538  BP: 135/79 (!) 136/93 108/68 118/76  Pulse: 87 98 100 100  Resp: 17 16 17 16   Temp: 98.4 F (36.9 C) 98.5 F (36.9 C) 98.4 F (36.9 C) 98.3 F (36.8 C)  TempSrc: Oral Oral Oral Oral  SpO2: 99% 99% 99% 100%  Weight:      Height:        NAD Fundus firm Ext: trace edema b/l  Lab Results  Component Value Date   WBC 10.4 09/27/2018   HGB 10.2 (L) 09/27/2018   HCT 30.8 (L) 09/27/2018   MCV 89.0 09/27/2018   PLT 171 09/27/2018    --/--/O POS (03/24 2304)/RImmune  A/P 22 y.o. G1P1001 PPD#1. Routine care.   Bipolar depression on seroquel Expect d/c tomorrow.    [facility] GEFFEL [employer]

## 2018-09-27 NOTE — Progress Notes (Signed)
CLINICAL SOCIAL WORK MATERNAL/CHILD NOTE  Patient Details  Name: Julie [name] MRN: [id number] Date of Birth: 09/26/2017  Date:  09/27/2018  Clinical Social Worker Initiating Note:  Abundio Miu, [place]     Date/Time: Initiated:  09/27/18/1513             Child's Name:  Julie [name]   Biological Parents:  Mother, Father(Father: Kandy Garrison)   Need for Interpreter:  None   Reason for Referral:  Behavioral Health Concerns   Address: [place], [place] [address]  Phone number:  [phone] (home)     Additional phone number:   Household Members/Support Persons (HM/SP):   Household Member/Support Person 1   HM/SP Name Relationship DOB or Age  HM/SP -1 Bryson Goins FOB   HM/SP -2     HM/SP -3     HM/SP -4     HM/SP -5     HM/SP -6     HM/SP -7     HM/SP -8       Natural Supports (not living in the home): Parent, Immediate Family   Professional Supports:None   Employment:Full-time   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 [employer]   Education:  [place] arranged:    Financial Resources:Medicaid   Other Resources:     Cultural/Religious Considerations Which May Impact Care:   Strengths: Ability to meet basic needs , Home prepared for child , Pediatrician chosen   Psychotropic Medications:         Pediatrician:    [place]  Pediatrician List:   [place]     Pediatrician Fax Number:    Risk Factors/Current Problems: Mental Health Concerns    Cognitive State: Able to Concentrate , Alert , Linear Thinking , Goal Oriented    Mood/Affect: Calm , Interested , Relaxed    CSW Assessment:CSW met with MOB at bedside to discuss consult for behavioral health concerns, FOB present. CSW asked FOB to leave during assessment with MOB's  permission, FOB left voluntarily. CSW introduced self and explained reason for consult. MOB was calm and engaged during assessment. MOB reported that she resides with FOB and works as a Freight forwarder at [employer]. MOB reported that she has everything needed for infant. CSW inquired about MOB's support system, MOB reported that FOB, her parents and sister were her supports.   CSW inquired about MOB's mental health history, MOB reported that she was diagnosed with Bipolar in the eighth grade. MOB reported that she is currently taking Quetiapine which has been effective. MOB denied any currently symptoms of Bipolar. MOB reported that her medication is managed by Dr. Creig Hines at [place]. MOB denied any other mental health history. MOB presented calm and remained engaged throughout assessment. MOB was holding and soothing infant during assessment. MOB did not demonstrate any acute mental health signs/symptoms. CSW assessed for safety, MOB denied SI, HI and domestic violence. CSW informed MOB that she may be more susceptible to postpartum depression due to her mental health history.   CSW provided education regarding the baby blues period vs. perinatal mood disorders, discussed treatment and gave resources for mental health follow up if concerns arise.  CSW recommends self-evaluation during the postpartum time period using the New Mom Checklist from Postpartum Progress and encouraged MOB to contact a medical professional if symptoms  are noted at any time.    CSW provided review of Sudden Infant Death Syndrome (SIDS) precautions.    CSW asked MOB if she was interested in parenting education programs for additional support, MOB declined.   CSW identifies no further need for intervention and no barriers to discharge at this time.    CSW Plan/Description: No Further Intervention Required/No Barriers to Discharge, Sudden Infant Death Syndrome (SIDS) Education, Perinatal Mood and Anxiety  Disorder (PMADs) Education    Burnis Medin, LCSW 09/27/2018, 3:15 PM

## 2018-09-27 NOTE — Lactation Note (Signed)
This note was copied from a baby's chart. Lactation Consultation Note  Patient Name: Julie [name] [id number] Date: 09/27/2018 Reason for consult: Follow-up assessment;1st time breastfeeding;Term P1, 32 hours female infant. Medical order from Pediatrician to supplement with formula due poor feedings and bilirubin levels at 95th percentile.  Infant had voids 3x and stools 6x since delivery,  LC changed one  void and  one stool (clay) while in room. Per mom ,she has been using DEBP every 3 hours and wearing breast shells during the day as advised by LC. Parents were pleased,  this is  infant's first time sustaining latch w/ 20 mm NS for 9 minutes. LC prefilled NS with one 1 ml of colostrum and 5 ml of Gerber Gentle 20 kcal formula infant was  actively suckling at breast with deep latch.  Infant took additional 10 ml of Gerber Gentle 20 kcal by curve tip syringe. Mom will continue to work towards latching infant with 20  NS and increasing duration of feedings. Mom knows to call Nurse or LC if she has any questions, concerns or need assistance with latching infant to breast.  Mom's plan: 1. Continue latch infant with NS, with or with pre-filled  EBM or / formula. 2. Wake baby techniques to keep infant awake at breast and increase feeding durations. 3. Mom will supplement with EBM./ or formula according infant's age/ hours by curve tip syringe or bottle nipple. 4. Mom will continue to  wear breast shells and pump every 3 hours for 15 minutes.    Maternal Data Formula Feeding for Exclusion: No  Feeding Feeding Type: Breast Fed  LATCH Score Latch: Grasps breast easily, tongue down, lips flanged, rhythmical sucking.  Audible Swallowing: Spontaneous and intermittent  Type of Nipple: Flat  Comfort (Breast/Nipple): Soft / non-tender  Hold (Positioning): Assistance needed to correctly position infant at breast and maintain latch.  LATCH Score: 8  Interventions Interventions: Adjust  position;Support pillows;Position options;DEBP;Expressed milk  Lactation Tools Discussed/Used Tools: Nipple Dorris Carnes   Consult Status Consult Status: Follow-up Date: 09/28/18 Follow-up type: In-patient    Danelle Earthly 09/27/2018, 8:51 PM

## 2018-09-27 NOTE — Lactation Note (Signed)
This note was copied from a baby's chart. Lactation Consultation Note  Patient Name: Julie [name] [id number] Date: 09/27/2018  Report received from RN.  Baby continues to have difficulty latching and showing interest in feeding.  Baby is now 67 hours old.  Mom has been hand expressing small amounts and spoon feeding baby.  Pediatrician was notified of feeding difficulty and rising bilirubin.  He ordered formula supplementation using slow flow nipple following breastfeeding attempt.  RN will call for feeding assessment when baby is ready to feed.   Maternal Data    Feeding Feeding Type: Breast Fed  LATCH Score                   Interventions    Lactation Tools Discussed/Used     Consult Status      [name] 09/27/2018, 2:32 PM

## 2018-09-27 NOTE — Lactation Note (Signed)
This note was copied from a baby's chart. Lactation Consultation Note  Patient Name: Julie [name] JMEQA'S Date: 09/27/2018 Reason for consult: Initial assessment;Difficult latch;1st time breastfeeding;Term P1, 17 hour female infant. Per parents, infant had one void and 2 stools. Per mom, infant been very spity  Infant is not latching only  holds breast in mouth.  Mom taught back hand expression and expressed 12 ml of colostrum, infant was given 7 ml by spoon and mom will offer the other 5 ml at next feeding after she latches infant to breast.  Mom has short shaft nipples, breast shells were given and explained how to use, mom plans to wear them during the day and knows not to sleep in them at night. Mom fitted with 20 mm NS. Mom will use DEBP every 3 hours for 15 minutes on initial setting . Mom shown how to use DEBP & how to disassemble, clean, & reassemble parts. Mom knows to call Nurse or LC if she has any more questions, concerns or wants to latch infant to breast.  Mom made aware of O/P services, breastfeeding support groups, community resources, and our phone # for post-discharge questions.  Mom plans: 1. Breastfeed according hunger cues, 8 or more times within 24 hours. 2. Mom will hand expression and give back volume if infant is still not latching to breast. 3. Mom will do as much STS as possible. 4. Mom will use DEBP  And pump every 3 hours on initial setting for 15 minutes.  Maternal Data Formula Feeding for Exclusion: No Has patient been taught Hand Expression?: Yes(Mom gave 7 ml EBM and has additional 5 ml for next feeding.) Does the patient have breastfeeding experience prior to this delivery?: No  Feeding Feeding Type: Breast Fed  LATCH Score Latch: Too sleepy or reluctant, no latch achieved, no sucking elicited.  Audible Swallowing: None  Type of Nipple: Everted at rest and after stimulation  Comfort (Breast/Nipple): Soft / non-tender  Hold (Positioning):  Assistance needed to correctly position infant at breast and maintain latch.  LATCH Score: 5  Interventions Interventions: Breast feeding basics reviewed;Assisted with latch;Skin to skin;Breast massage;Hand express;Breast compression;Adjust position;Support pillows;Position options;Expressed milk;Shells;DEBP  Lactation Tools Discussed/Used     Consult Status Consult Status: Follow-up Date: 09/27/18 Follow-up type: In-patient    Danelle Earthly 09/27/2018, 5:08 AM

## 2018-09-28 NOTE — Discharge Summary (Signed)
Obstetric Discharge Summary Reason for Admission: vaginal bleeding Prenatal Procedures: ultrasound Intrapartum Procedures: spontaneous vaginal delivery Postpartum Procedures: none Complications-Operative and Postpartum: 2nd degree perineal laceration and marginal placental abruption Hemoglobin  Date Value Ref Range Status  09/27/2018 10.2 (L) 12.0 - 15.0 g/dL Final   HCT  Date Value Ref Range Status  09/27/2018 30.8 (L) 36.0 - 46.0 % Final    Physical Exam:  General: alert and cooperative Lochia: appropriate   Discharge Diagnoses: Term Pregnancy-delivered and marginal placental abruption  Discharge Information: Date: 09/28/2018 Activity: pelvic rest Diet: routine Medications: PNV Condition: stable Instructions: refer to practice specific booklet Discharge to: home Follow-up Information    Waynard Reeds, MD. Schedule an appointment as soon as possible for a visit in 1 month(s).   Specialty:  Obstetrics and Gynecology Contact information: [address] [place] [address] [phone]           Newborn Data: Live born female  Birth Weight: 7 lb 8.1 oz (3405 g) APGAR: 8, 9  Newborn Delivery   Birth date/time:  09/26/2018 12:03:00 Delivery type:  Vaginal, Spontaneous     Home with mother.  ANDERSON,MARK E 09/28/2018, 9:08 AM

## 2018-09-28 NOTE — Lactation Note (Signed)
This note was copied from a baby's chart. Lactation Consultation Note  Patient Name: Julie [name] RUEAV'W Date: 09/28/2018 Reason for consult: Follow-up assessment;Difficult latch Mom called for feeding assist.  Baby awake and fussy.  Mom has baby positioned in football hold.  Assisted with latch attempt without shield but baby had difficulty grasping breast.  20 mm nipple shield applied but no suck elicited and baby now sleepy.  Nipple shield filled with colostrum.  Baby sucked a few times and fell asleep.  This was repeated a few times.  Instructed to give remainder of pumped colostrum and follow with formula by bottle.  Reassured mom.  Encouraged to schedule outpatient appointment once milk is to volume for more assistance.    Maternal Data    Feeding Feeding Type: Breast Fed  LATCH Score Latch: Repeated attempts needed to sustain latch, nipple held in mouth throughout feeding, stimulation needed to elicit sucking reflex.  Audible Swallowing: None  Type of Nipple: Everted at rest and after stimulation  Comfort (Breast/Nipple): Soft / non-tender  Hold (Positioning): Assistance needed to correctly position infant at breast and maintain latch.  LATCH Score: 6  Interventions Interventions: Shells;DEBP  Lactation Tools Discussed/Used Tools: Nipple Shields Nipple shield size: 20   Consult Status Consult Status: Complete Date: 09/28/18 Follow-up type: Call as needed    [name] 09/28/2018, 10:52 AM

## 2018-09-28 NOTE — Lactation Note (Signed)
This note was copied from a baby's chart. Lactation Consultation Note  Patient Name: Julie [name] EXBMW'U Date: 09/28/2018 Reason for consult: Follow-up assessment;Hyperbilirubinemia;Difficult latch Baby is 44 hours ol/6% weight loss.  Mom reports a few successful latches using nipple shield.  RN assisted this morning and states suck is improving.  Parents supplemented formula with curved tip syringe.  Explained using a slow flow nipple is preferred with discharge planned.  Parents verbalize understanding.  Mom Is pumping every 3 hours and obtaining 5-8 mls of colostrum. She does have a breast pump at home.  Bilirubin was just drawn.  Instructed to call for lactation assist when baby is ready to eat again.  Baby just finished bottle.  Maternal Data    Feeding Feeding Type: Formula  LATCH Score                   Interventions    Lactation Tools Discussed/Used     Consult Status Consult Status: Follow-up Date: 09/28/18 Follow-up type: In-patient    [name] 09/28/2018, 8:53 AM

## 2018-09-28 NOTE — Progress Notes (Signed)
PPD#2 Pt without complaints. REady for discharge.  VSSAF IMP/ stable Plan/ Will discharge

## 2018-10-30 DIAGNOSIS — Z1331 Encounter for screening for depression: Secondary | ICD-10-CM | POA: Diagnosis not present

## 2018-10-30 DIAGNOSIS — Z124 Encounter for screening for malignant neoplasm of cervix: Secondary | ICD-10-CM | POA: Diagnosis not present

## 2018-10-30 LAB — HM PAP SMEAR: HM Pap smear: NEGATIVE

## 2018-11-16 DIAGNOSIS — Z3202 Encounter for pregnancy test, result negative: Secondary | ICD-10-CM | POA: Diagnosis not present

## 2018-11-16 DIAGNOSIS — Z30430 Encounter for insertion of intrauterine contraceptive device: Secondary | ICD-10-CM | POA: Diagnosis not present

## 2018-12-14 DIAGNOSIS — Z30431 Encounter for routine checking of intrauterine contraceptive device: Secondary | ICD-10-CM | POA: Diagnosis not present

## 2019-03-05 DIAGNOSIS — Z3202 Encounter for pregnancy test, result negative: Secondary | ICD-10-CM | POA: Diagnosis not present

## 2019-03-05 DIAGNOSIS — Z3201 Encounter for pregnancy test, result positive: Secondary | ICD-10-CM | POA: Diagnosis not present

## 2019-03-05 DIAGNOSIS — Z30431 Encounter for routine checking of intrauterine contraceptive device: Secondary | ICD-10-CM | POA: Diagnosis not present

## 2019-03-06 IMAGING — US US OB TRANSVAGINAL
1 series · 14 of 28 positions shown · non-contrast
Comparison: Pelvic ultrasound 01/30/2018

CLINICAL DATA: Vaginal bleeding.  Pregnant patient.

EXAM:
OBSTETRIC <14 WK US AND TRANSVAGINAL OB US
TECHNIQUE: Both transabdominal and transvaginal ultrasound examinations were
performed for complete evaluation of the gestation as well as the
maternal uterus, adnexal regions, and pelvic cul-de-sac.
Transvaginal technique was performed to assess early pregnancy.

[Series 1: us ob transvaginal · 0.23mm/px · 14 of 68 slices shown]
[im 3/68]
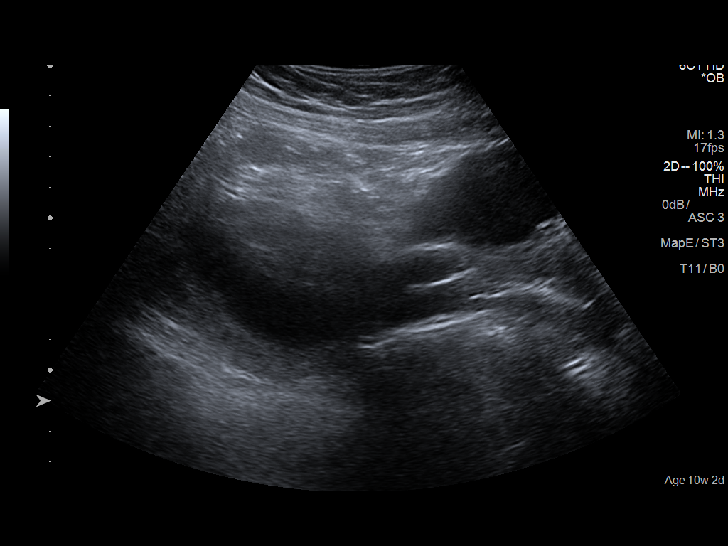
[im 8/68]
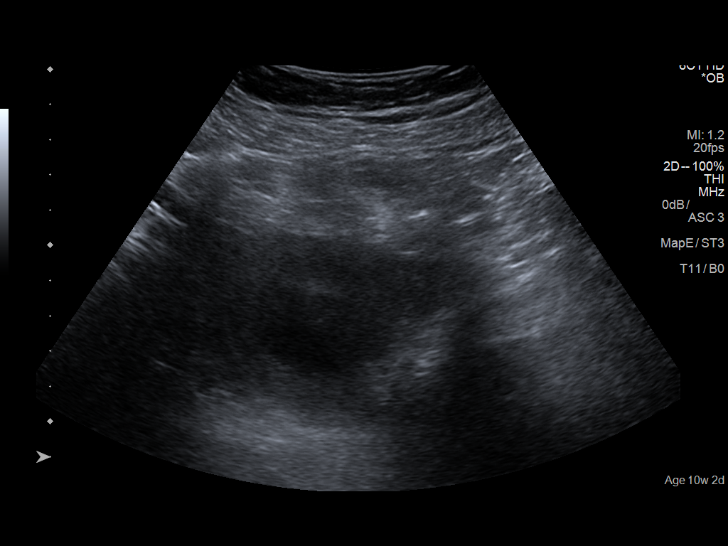
[im 13/68]
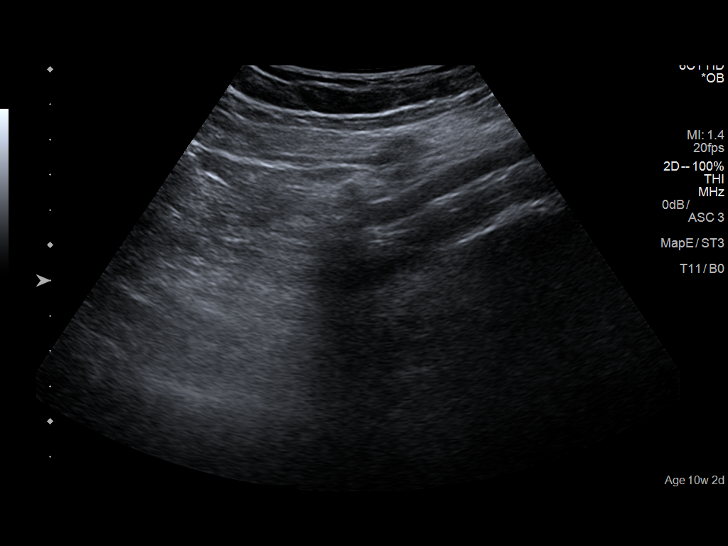
[im 18/68]
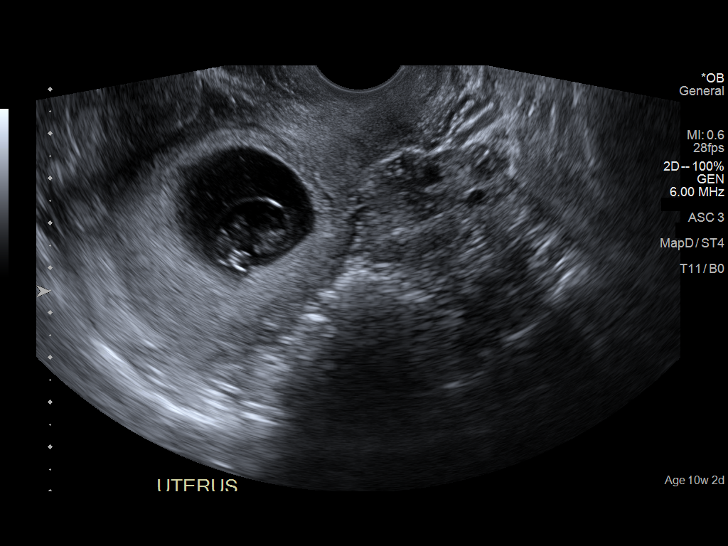
[im 23/68]
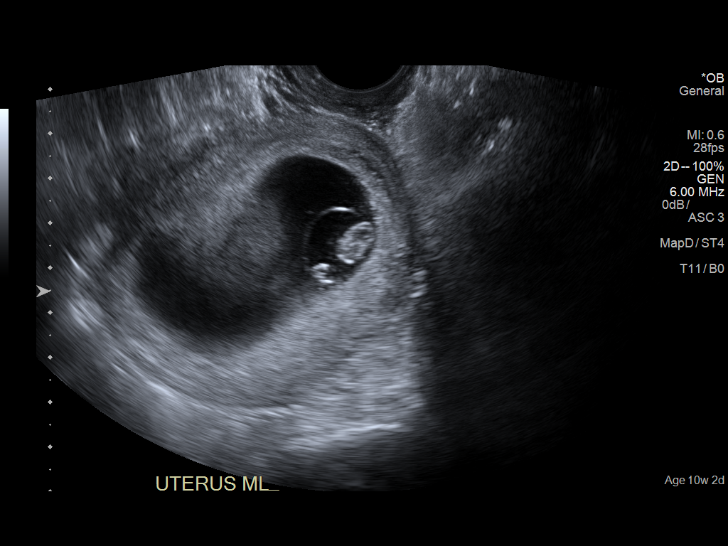
[im 28/68]
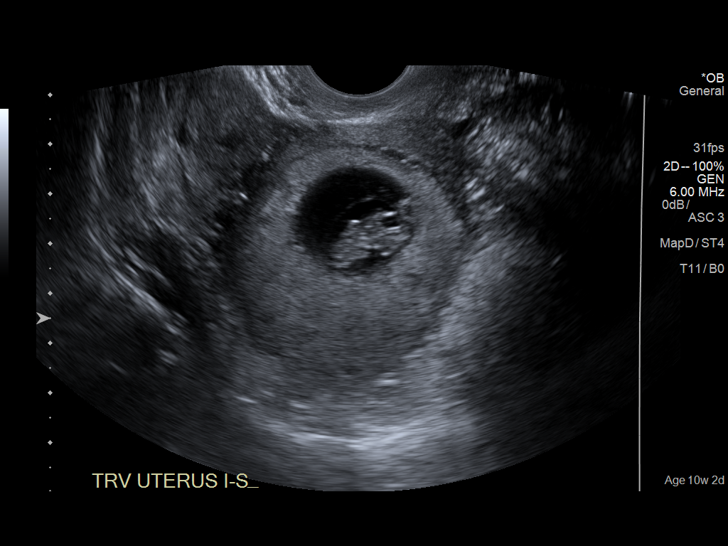
[im 33/68]
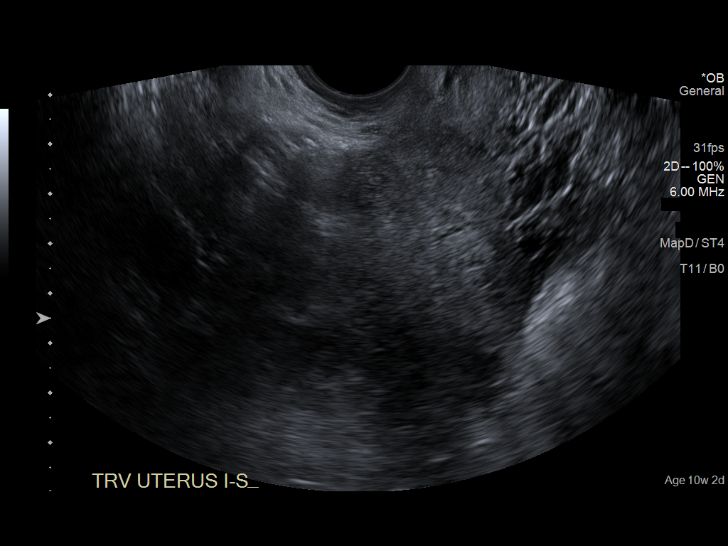
[im 38/68]
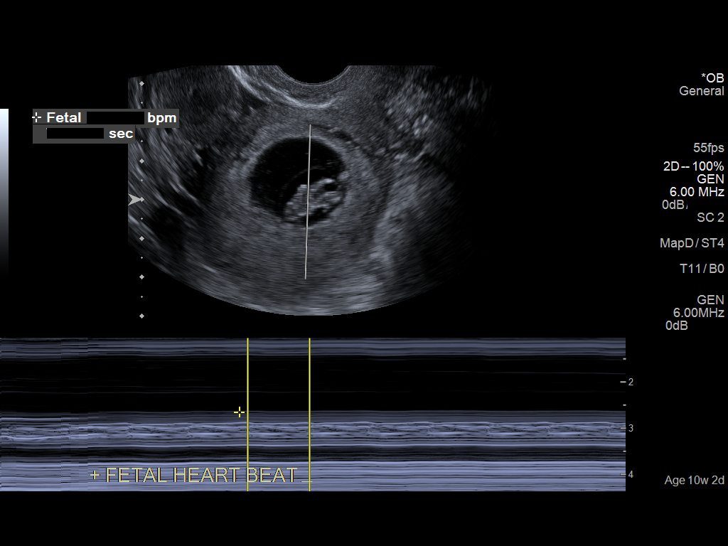
[im 43/68]
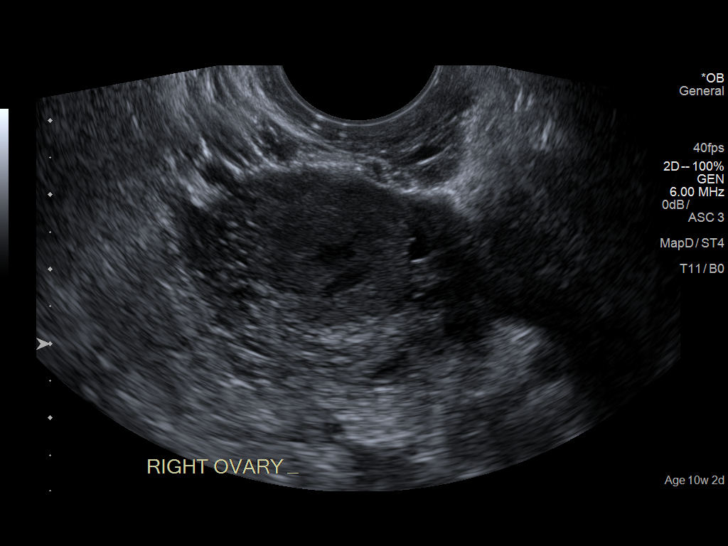
[im 48/68]
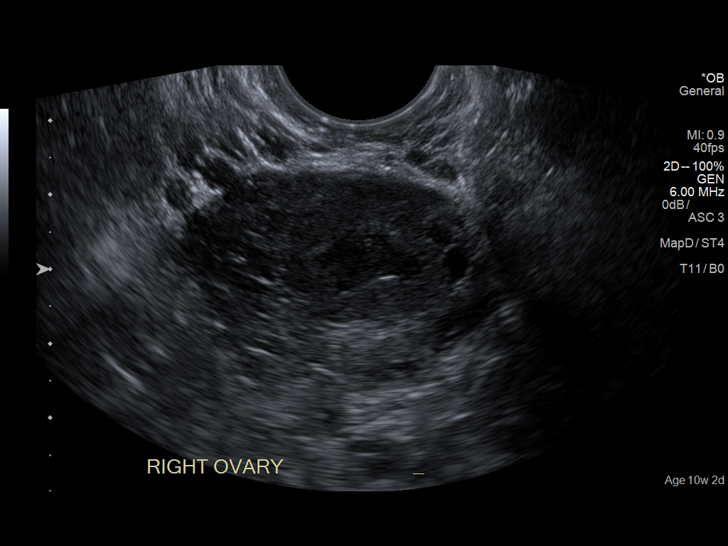
[im 53/68]
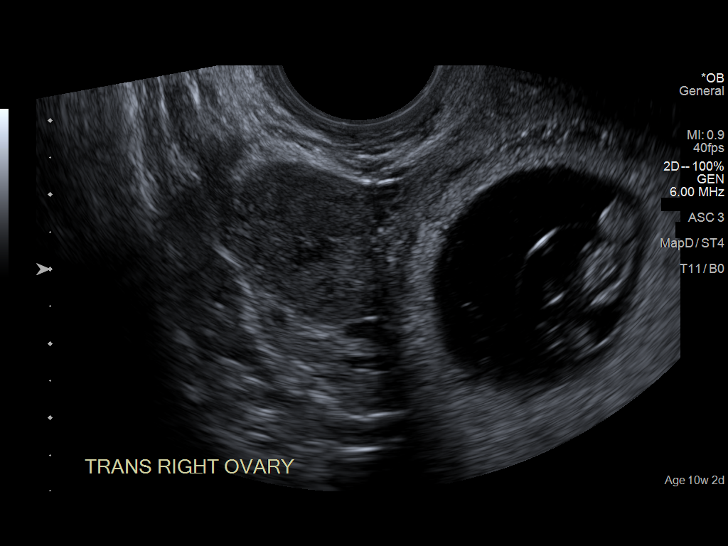
[im 58/68]
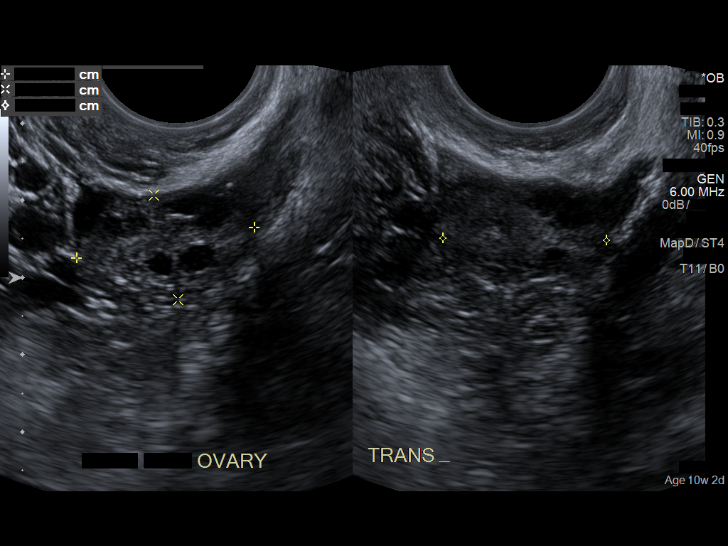
[im 63/68]
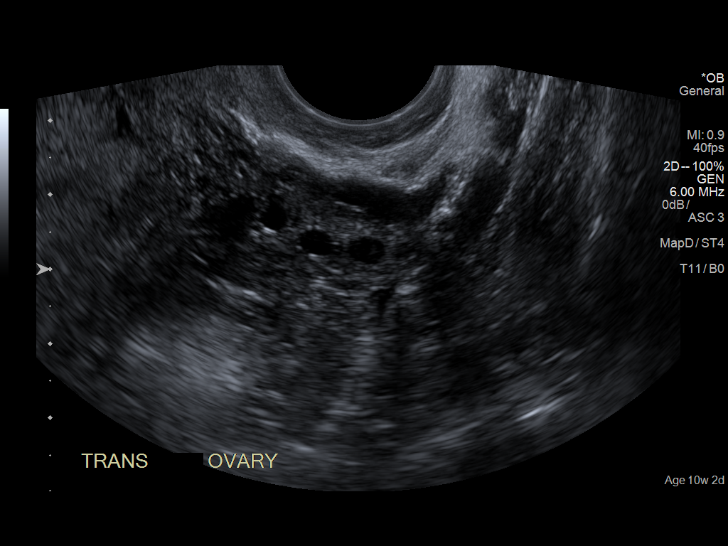
[im 68/68]
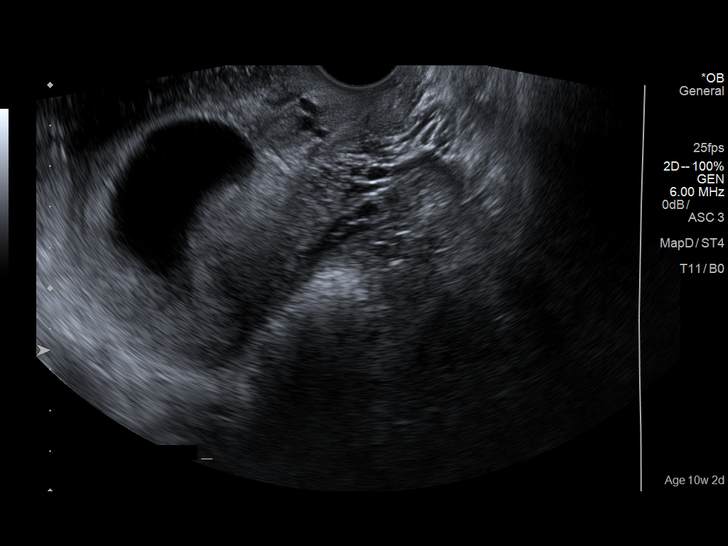

[14 of 28 positions shown; findings below may reference images not displayed]

FINDINGS: Intrauterine gestational sac: Single

Yolk sac:  Visualized.

Embryo:  Visualized.

Cardiac Activity: Visualized.

Heart Rate: 165 bpm

CRL:  16.8 mm   a w   1 d                  US EDC: 09/28/2018

Subchorionic hemorrhage:  None visualized.

Maternal uterus/adnexae: Normal right and left ovaries. Corpus
luteum within the right ovary. No free fluid in the pelvis.
IMPRESSION: Single live intrauterine gestation.  No subchorionic hemorrhage.

## 2019-04-06 DIAGNOSIS — Z23 Encounter for immunization: Secondary | ICD-10-CM | POA: Diagnosis not present

## 2019-05-21 DIAGNOSIS — Z30011 Encounter for initial prescription of contraceptive pills: Secondary | ICD-10-CM | POA: Diagnosis not present

## 2019-07-02 ENCOUNTER — Ambulatory Visit: Payer: Medicaid Other | Attending: Internal Medicine

## 2019-07-02 DIAGNOSIS — Z20828 Contact with and (suspected) exposure to other viral communicable diseases: Secondary | ICD-10-CM | POA: Diagnosis not present

## 2019-07-02 DIAGNOSIS — Z20822 Contact with and (suspected) exposure to covid-19: Secondary | ICD-10-CM

## 2019-07-03 LAB — NOVEL CORONAVIRUS, NAA: SARS-CoV-2, NAA: NOT DETECTED

## 2019-07-05 NOTE — L&D Delivery Note (Addendum)
Delivery Note At 8:30 PM a viable female was delivered via Vaginal, Spontaneous (Presentation: Right Occiput Anterior).  APGAR: 7, 9; weight per medical record   Placenta status: Spontaneous, Intact.  Cord: 3 vessels with the following complications: None.   Anesthesia: Epidural, lidocaine Episiotomy: None Lacerations: 2nd degree perineal, 2nd degree left labial Suture Repair: 3.0 vicryl for perineal, 3.0 vicryl for labial Est. Blood Loss (mL):   Mom to postpartum.  Baby to Couplet care / Skin to Skin.  Fayette Pho 04/12/2020, 9:15 PM  I was gloved and present for delivery of the baby and placenta. I assisted with laceration repair as noted above.  Sheila Oats, MD OB Fellow, [facility] 04/13/2020 1:47 AM

## 2019-08-03 ENCOUNTER — Ambulatory Visit (INDEPENDENT_AMBULATORY_CARE_PROVIDER_SITE_OTHER): Payer: Medicaid Other

## 2019-08-03 ENCOUNTER — Other Ambulatory Visit: Payer: Self-pay

## 2019-08-03 ENCOUNTER — Encounter (HOSPITAL_COMMUNITY): Payer: Self-pay

## 2019-08-03 ENCOUNTER — Ambulatory Visit (HOSPITAL_COMMUNITY)
Admission: EM | Admit: 2019-08-03 | Discharge: 2019-08-03 | Disposition: A | Discharge status: Home / Self Care | Payer: Medicaid Other | Attending: Family Medicine | Admitting: Family Medicine

## 2019-08-03 DIAGNOSIS — R079 Chest pain, unspecified: Secondary | ICD-10-CM | POA: Diagnosis not present

## 2019-08-03 DIAGNOSIS — J45909 Unspecified asthma, uncomplicated: Secondary | ICD-10-CM | POA: Diagnosis not present

## 2019-08-03 DIAGNOSIS — R05 Cough: Secondary | ICD-10-CM

## 2019-08-03 DIAGNOSIS — M549 Dorsalgia, unspecified: Secondary | ICD-10-CM | POA: Insufficient documentation

## 2019-08-03 DIAGNOSIS — Z20822 Contact with and (suspected) exposure to covid-19: Secondary | ICD-10-CM | POA: Insufficient documentation

## 2019-08-03 DIAGNOSIS — Z3202 Encounter for pregnancy test, result negative: Secondary | ICD-10-CM

## 2019-08-03 DIAGNOSIS — K219 Gastro-esophageal reflux disease without esophagitis: Secondary | ICD-10-CM | POA: Diagnosis not present

## 2019-08-03 DIAGNOSIS — Z79899 Other long term (current) drug therapy: Secondary | ICD-10-CM | POA: Insufficient documentation

## 2019-08-03 DIAGNOSIS — R059 Cough, unspecified: Secondary | ICD-10-CM

## 2019-08-03 LAB — POCT PREGNANCY, URINE: Preg Test, Ur: NEGATIVE

## 2019-08-03 LAB — POC URINE PREG, ED
Preg Test, Ur: NEGATIVE
Preg Test, Ur: NEGATIVE

## 2019-08-03 MED ORDER — AZITHROMYCIN 250 MG PO TABS: ORAL_TABLET | ORAL | 0 refills | Status: AC

## 2019-08-03 MED ORDER — OMEPRAZOLE 20 MG PO CPDR: 20.0000 mg | DELAYED_RELEASE_CAPSULE | Freq: Every day | ORAL | 0 refills | Status: DC

## 2019-08-03 MED ORDER — PREDNISONE 10 MG (21) PO TBPK: ORAL_TABLET | Freq: Every day | ORAL | 0 refills | Status: DC

## 2019-08-03 MED ORDER — BENZONATATE 100 MG PO CAPS: 100.0000 mg | ORAL_CAPSULE | Freq: Three times a day (TID) | ORAL | 0 refills | Status: DC

## 2019-08-03 NOTE — Discharge Instructions (Signed)
Your chest xray is normal today which is reassuring.  I will try a few medications to treat your cough with a course of antibiotics and steroids, due to the coughing fits you experience.  Tessalon as needed for cough, you can also continue with cough drops.  I would like to try a daily reflux medication as well as reflux can certainly trigger cough.  If symptoms worsen or do not improve in the next week to return to be seen or to follow up with your PCP.

## 2019-08-03 NOTE — ED Provider Notes (Signed)
[facility]    CSN: [id number] Arrival date & time: 08/03/19  1151      History   Chief Complaint Chief Complaint  Patient presents with  . Cough    HPI Julie [name] is a 24 y.o. female.   Julie [name] presents with complaints of cough. Over a month of cough. Causes chest and back pain. Once she starts coughing can't stop, can cause gagging. Not worsening, not getting better. No fevers. Shortness of breath which is worse later in the day, wakes up without shortness of breath . Cough wakes her up at night. Cough is productive of yellow mucus. Denies any previous similar. Has been taking tylenol cold and flu which hasn't help. Cough drops do help. Has had increased heartburn and reflux. No nasal drainage drainage, sore throat, or ear pain. No known ill contacts. History  Of childhood asthma.    ROS per HPI, negative if not otherwise mentioned.      Past Medical History:  Diagnosis Date  . Anxiety   . Asthma   . Depression     Patient Active Problem List   Diagnosis Date Noted  . Labor abnormal 09/26/2018  . Spontaneous vaginal delivery 09/26/2018  . Vaginal bleeding in pregnancy, third trimester 09/25/2018  . Pre-birth visit for expectant parents 09/04/2018  . Generalized anxiety disorder 05/16/2018  . Obsessive compulsive disorder 05/16/2018  . Acute vaginitis 07/19/2017  . Bipolar I disorder, moderate, current or most recent episode manic, in partial remission, with anxious distress (HCC) 09/10/2013  . Acne 07/26/2011  . Amenorrhea 07/26/2011    Past Surgical History:  Procedure Laterality Date  . NO PAST SURGERIES      OB History    Gravida  1   Para  1   Term  1   Preterm      AB      Living  1     SAB      TAB      Ectopic      Multiple  0   Live Births  1            Home Medications    Prior to Admission medications   Medication Sig Start Date End Date Taking? Authorizing Provider  azithromycin  (ZITHROMAX) 250 MG tablet Take 2 tablets (500 mg total) by mouth daily for 1 day, THEN 1 tablet (250 mg total) daily for 4 days. 08/03/19 08/08/19  Georgetta Haber, NP  benzonatate (TESSALON) 100 MG capsule Take 1-2 capsules (100-200 mg total) by mouth every 8 (eight) hours. 08/03/19   Georgetta Haber, NP  folic acid (FOLVITE) 1 MG tablet Take 1 mg by mouth every morning. 01/22/18   [provider]  omeprazole (PRILOSEC) 20 MG capsule Take 1 capsule (20 mg total) by mouth daily. 08/03/19   Georgetta Haber, NP  predniSONE (STERAPRED UNI-PAK 21 TAB) 10 MG (21) TBPK tablet Take by mouth daily. Per box instruction 08/03/19   Georgetta Haber, NP  Prenatal Vit-Fe Fumarate-FA (PRENATAL MULTIVITAMIN) TABS tablet Take 1 tablet by mouth daily at 12 noon.    [provider]  QUEtiapine (SEROQUEL XR) 200 MG 24 hr tablet Take 1 tablet (200 mg total) by mouth at bedtime. 09/20/18   Chauncey Mann, MD    Family History Family History  Problem Relation Age of Onset  . Bipolar disorder Father   . Alcohol abuse Paternal Grandmother   . Healthy Mother  Social History Social History   Tobacco Use  . Smoking status: Never Smoker  . Smokeless tobacco: Never Used  Substance Use Topics  . Alcohol use: No    Comment: hx of substance abuse.Denies all now  . Drug use: No    Comment: History of drug use/clean 3 months     Allergies   Patient has no known allergies.   Review of Systems Review of Systems   Physical Exam Triage Vital Signs ED Triage Vitals  Enc Vitals Group     BP 08/03/19 1204 125/80     Pulse Rate 08/03/19 1204 86     Resp 08/03/19 1204 15     Temp 08/03/19 1204 98 F (36.7 C)     Temp Source 08/03/19 1204 Oral     SpO2 08/03/19 1204 100 %     Weight --      Height --      Head Circumference --      Peak Flow --      Pain Score 08/03/19 1203 0     Pain Loc --      Pain Edu? --      Excl. in GC? --    No data found.  Updated Vital Signs BP 125/80  (BP Location: Right Arm)   Pulse 86   Temp 98 F (36.7 C) (Oral)   Resp 15   SpO2 100%   Visual Acuity Right Eye Distance:   Left Eye Distance:   Bilateral Distance:    Right Eye Near:   Left Eye Near:    Bilateral Near:     Physical Exam Constitutional:      General: She is not in acute distress.    Appearance: She is well-developed.  Cardiovascular:     Rate and Rhythm: Normal rate.  Pulmonary:     Effort: Pulmonary effort is normal.     Breath sounds: Normal breath sounds.     Comments: Throat clearing noted, no cough during exam  Skin:    General: Skin is warm and dry.  Neurological:     Mental Status: She is alert and oriented to person, place, and time.      UC Treatments / Results  Labs (all labs ordered are listed, but only abnormal results are displayed) Labs Reviewed  NOVEL CORONAVIRUS, NAA (HOSP ORDER, SEND-OUT TO REF LAB; TAT 18-24 HRS)  POC URINE PREG, ED  POCT PREGNANCY, URINE  POC URINE PREG, ED    EKG   Radiology DG Chest 2 View  Result Date: 08/03/2019 CLINICAL DATA:  Cough for a month. EXAM: CHEST - 2 VIEW COMPARISON:  None. FINDINGS: The heart size and mediastinal contours are within normal limits. Both lungs are clear. The visualized skeletal structures are unremarkable. IMPRESSION: No active cardiopulmonary disease. Electronically Signed   By: Gerome Sam III M.D   On: 08/03/2019 14:00    Procedures Procedures (including critical care time)  Medications Ordered in UC Medications - No data to display  Initial Impression / Assessment and Plan / UC Course  I have reviewed the triage vital signs and the nursing notes.  Pertinent labs & imaging results that were available during my care of the patient were reviewed by me and considered in my medical decision making (see chart for details).     Afebrile, no work of breathing. No pain with inspiration, soreness to chest wall and back with coughing. Coughing fits. Increased reflux.  Normal chest xray. Will cover for atypicals with  azithromycin, as well will try prednisone and tessalon. Omeprazole initiated. Encouraged close follow up with PCP. Return precautions provided. Patient verbalized understanding and agreeable to plan.   Final Clinical Impressions(s) / UC Diagnoses   Final diagnoses:  Cough     Discharge Instructions     Your chest xray is normal today which is reassuring.  I will try a few medications to treat your cough with a course of antibiotics and steroids, due to the coughing fits you experience.  Tessalon as needed for cough, you can also continue with cough drops.  I would like to try a daily reflux medication as well as reflux can certainly trigger cough.  If symptoms worsen or do not improve in the next week to return to be seen or to follow up with your PCP.      ED Prescriptions    Medication Sig Dispense Auth. Provider   azithromycin (ZITHROMAX) 250 MG tablet Take 2 tablets (500 mg total) by mouth daily for 1 day, THEN 1 tablet (250 mg total) daily for 4 days. 6 tablet Augusto Gamble B, NP   predniSONE (STERAPRED UNI-PAK 21 TAB) 10 MG (21) TBPK tablet Take by mouth daily. Per box instruction 21 tablet Alorah Mcree B, NP   benzonatate (TESSALON) 100 MG capsule Take 1-2 capsules (100-200 mg total) by mouth every 8 (eight) hours. 21 capsule Augusto Gamble B, NP   omeprazole (PRILOSEC) 20 MG capsule Take 1 capsule (20 mg total) by mouth daily. 30 capsule Zigmund Gottron, NP     PDMP not reviewed this encounter.   Zigmund Gottron, NP 08/03/19 1437

## 2019-08-03 NOTE — ED Triage Notes (Signed)
Patient presents to Urgent Care with complaints of cough since about a month ago. Patient reports she had a negative covid test when it started, is willing to be tested again, cough drops have been helping.

## 2019-08-04 LAB — NOVEL CORONAVIRUS, NAA (HOSP ORDER, SEND-OUT TO REF LAB; TAT 18-24 HRS): SARS-CoV-2, NAA: NOT DETECTED

## 2019-09-06 ENCOUNTER — Other Ambulatory Visit: Payer: Self-pay

## 2019-09-06 ENCOUNTER — Ambulatory Visit (INDEPENDENT_AMBULATORY_CARE_PROVIDER_SITE_OTHER): Payer: Medicaid Other | Admitting: *Deleted

## 2019-09-06 DIAGNOSIS — Z3201 Encounter for pregnancy test, result positive: Secondary | ICD-10-CM | POA: Diagnosis not present

## 2019-09-06 DIAGNOSIS — Z3480 Encounter for supervision of other normal pregnancy, unspecified trimester: Secondary | ICD-10-CM

## 2019-09-06 MED ORDER — BLOOD PRESSURE MONITOR KIT: 1.0000 | PACK | Freq: Once | 0 refills | Status: AC

## 2019-09-06 NOTE — Progress Notes (Signed)
Ms. Glanzer presents today for UPT. She has no unusual complaints.  LMP:06/25/19 EDD: 03/31/20    OBJECTIVE: Appears well, in no apparent distress.  OB History    Gravida  1   Para  1   Term  1   Preterm      AB      Living  1     SAB      TAB      Ectopic      Multiple  0   Live Births  1         Home UPT Result:Positive In-Office UPT result:Positive  I have reviewed the patient's medical, obstetrical, social, and family histories, and medications.   ASSESSMENT: Positive pregnancy test  PLAN Prenatal care to be completed at: [facility] Blood pressure cuff ordered today.

## 2019-09-17 ENCOUNTER — Encounter: Payer: Self-pay | Admitting: Advanced Practice Midwife

## 2019-09-17 ENCOUNTER — Other Ambulatory Visit: Payer: Self-pay

## 2019-09-17 ENCOUNTER — Other Ambulatory Visit (HOSPITAL_COMMUNITY)
Admission: RE | Admit: 2019-09-17 | Discharge: 2019-09-17 | Disposition: A | Discharge status: Home / Self Care | Payer: Medicaid Other | Source: Ambulatory Visit | Attending: Advanced Practice Midwife | Admitting: Advanced Practice Midwife

## 2019-09-17 ENCOUNTER — Ambulatory Visit (INDEPENDENT_AMBULATORY_CARE_PROVIDER_SITE_OTHER): Payer: Medicaid Other | Admitting: Advanced Practice Midwife

## 2019-09-17 VITALS — BP 124/76 | HR 80 | Wt 181.8 lb

## 2019-09-17 DIAGNOSIS — Z8279 Family history of other congenital malformations, deformations and chromosomal abnormalities: Secondary | ICD-10-CM

## 2019-09-17 DIAGNOSIS — Z3480 Encounter for supervision of other normal pregnancy, unspecified trimester: Secondary | ICD-10-CM | POA: Insufficient documentation

## 2019-09-17 DIAGNOSIS — O219 Vomiting of pregnancy, unspecified: Secondary | ICD-10-CM

## 2019-09-17 DIAGNOSIS — Z3687 Encounter for antenatal screening for uncertain dates: Secondary | ICD-10-CM

## 2019-09-17 NOTE — Patient Instructions (Signed)
First Trimester of Pregnancy The first trimester of pregnancy is from week 1 until the end of week 13 (months 1 through 3). A week after a sperm fertilizes an egg, the egg will implant on the wall of the uterus. This embryo will begin to develop into a baby. Genes from you and your partner will form the baby. The female genes will determine whether the baby will be a boy or a girl. At 6-8 weeks, the eyes and face will be formed, and the heartbeat can be seen on ultrasound. At the end of 12 weeks, all the baby's organs will be formed. Now that you are pregnant, you will want to do everything you can to have a healthy baby. Two of the most important things are to get good prenatal care and to follow your health care provider's instructions. Prenatal care is all the medical care you receive before the baby's birth. This care will help prevent, find, and treat any problems during the pregnancy and childbirth. Body changes during your first trimester Your body goes through many changes during pregnancy. The changes vary from woman to woman.  You may gain or lose a couple of pounds at first.  You may feel sick to your stomach (nauseous) and you may throw up (vomit). If the vomiting is uncontrollable, call your health care provider.  You may tire easily.  You may develop headaches that can be relieved by medicines. All medicines should be approved by your health care provider.  You may urinate more often. Painful urination may mean you have a bladder infection.  You may develop heartburn as a result of your pregnancy.  You may develop constipation because certain hormones are causing the muscles that push stool through your intestines to slow down.  You may develop hemorrhoids or swollen veins (varicose veins).  Your breasts may begin to grow larger and become tender. Your nipples may stick out more, and the tissue that surrounds them (areola) may become darker.  Your gums may bleed and may be  sensitive to brushing and flossing.  Dark spots or blotches (chloasma, mask of pregnancy) may develop on your face. This will likely fade after the baby is born.  Your menstrual periods will stop.  You may have a loss of appetite.  You may develop cravings for certain kinds of food.  You may have changes in your emotions from day to day, such as being excited to be pregnant or being concerned that something may go wrong with the pregnancy and baby.  You may have more vivid and strange dreams.  You may have changes in your hair. These can include thickening of your hair, rapid growth, and changes in texture. Some women also have hair loss during or after pregnancy, or hair that feels dry or thin. Your hair will most likely return to normal after your baby is born. What to expect at prenatal visits During a routine prenatal visit:  You will be weighed to make sure you and the baby are growing normally.  Your blood pressure will be taken.  Your abdomen will be measured to track your baby's growth.  The fetal heartbeat will be listened to between weeks 10 and 14 of your pregnancy.  Test results from any previous visits will be discussed. Your health care provider may ask you:  How you are feeling.  If you are feeling the baby move.  If you have had any abnormal symptoms, such as leaking fluid, bleeding, severe headaches, or abdominal   cramping.  If you are using any tobacco products, including cigarettes, chewing tobacco, and electronic cigarettes.  If you have any questions. Other tests that may be performed during your first trimester include:  Blood tests to find your blood type and to check for the presence of any previous infections. The tests will also be used to check for low iron levels (anemia) and protein on red blood cells (Rh antibodies). Depending on your risk factors, or if you previously had diabetes during pregnancy, you may have tests to check for high blood sugar  that affects pregnant women (gestational diabetes).  Urine tests to check for infections, diabetes, or protein in the urine.  An ultrasound to confirm the proper growth and development of the baby.  Fetal screens for spinal cord problems (spina bifida) and Down syndrome.  HIV (human immunodeficiency virus) testing. Routine prenatal testing includes screening for HIV, unless you choose not to have this test.  You may need other tests to make sure you and the baby are doing well. Follow these instructions at home: Medicines  Follow your health care provider's instructions regarding medicine use. Specific medicines may be either safe or unsafe to take during pregnancy.  Take a prenatal vitamin that contains at least 600 micrograms (mcg) of folic acid.  If you develop constipation, try taking a stool softener if your health care provider approves. Eating and drinking   Eat a balanced diet that includes fresh fruits and vegetables, whole grains, good sources of protein such as meat, eggs, or tofu, and low-fat dairy. Your health care provider will help you determine the amount of weight gain that is right for you.  Avoid raw meat and uncooked cheese. These carry germs that can cause birth defects in the baby.  Eating four or five small meals rather than three large meals a day may help relieve nausea and vomiting. If you start to feel nauseous, eating a few soda crackers can be helpful. Drinking liquids between meals, instead of during meals, also seems to help ease nausea and vomiting.  Limit foods that are high in fat and processed sugars, such as fried and sweet foods.  To prevent constipation: ? Eat foods that are high in fiber, such as fresh fruits and vegetables, whole grains, and beans. ? Drink enough fluid to keep your urine clear or pale yellow. Activity  Exercise only as directed by your health care provider. Most women can continue their usual exercise routine during  pregnancy. Try to exercise for 30 minutes at least 5 days a week. Exercising will help you: ? Control your weight. ? Stay in shape. ? Be prepared for labor and delivery.  Experiencing pain or cramping in the lower abdomen or lower back is a good sign that you should stop exercising. Check with your health care provider before continuing with normal exercises.  Try to avoid standing for long periods of time. Move your legs often if you must stand in one place for a long time.  Avoid heavy lifting.  Wear low-heeled shoes and practice good posture.  You may continue to have sex unless your health care provider tells you not to. Relieving pain and discomfort  Wear a good support bra to relieve breast tenderness.  Take warm sitz baths to soothe any pain or discomfort caused by hemorrhoids. Use hemorrhoid cream if your health care provider approves.  Rest with your legs elevated if you have leg cramps or low back pain.  If you develop varicose veins in   your legs, wear support hose. Elevate your feet for 15 minutes, 3-4 times a day. Limit salt in your diet. Prenatal care  Schedule your prenatal visits by the twelfth week of pregnancy. They are usually scheduled monthly at first, then more often in the last 2 months before delivery.  Write down your questions. Take them to your prenatal visits.  Keep all your prenatal visits as told by your health care provider. This is important. Safety  Wear your seat belt at all times when driving.  Make a list of emergency phone numbers, including numbers for family, friends, the hospital, and police and fire departments. General instructions  Ask your health care provider for a referral to a local prenatal education class. Begin classes no later than the beginning of month 6 of your pregnancy.  Ask for help if you have counseling or nutritional needs during pregnancy. Your health care provider can offer advice or refer you to specialists for help  with various needs.  Do not use hot tubs, steam rooms, or saunas.  Do not douche or use tampons or scented sanitary pads.  Do not cross your legs for long periods of time.  Avoid cat litter boxes and soil used by cats. These carry germs that can cause birth defects in the baby and possibly loss of the fetus by miscarriage or stillbirth.  Avoid all smoking, herbs, alcohol, and medicines not prescribed by your health care provider. Chemicals in these products affect the formation and growth of the baby.  Do not use any products that contain nicotine or tobacco, such as cigarettes and e-cigarettes. If you need help quitting, ask your health care provider. You may receive counseling support and other resources to help you quit.  Schedule a dentist appointment. At home, brush your teeth with a soft toothbrush and be gentle when you floss. Contact a health care provider if:  You have dizziness.  You have mild pelvic cramps, pelvic pressure, or nagging pain in the abdominal area.  You have persistent nausea, vomiting, or diarrhea.  You have a bad smelling vaginal discharge.  You have pain when you urinate.  You notice increased swelling in your face, hands, legs, or ankles.  You are exposed to fifth disease or chickenpox.  You are exposed to German measles (rubella) and have never had it. Get help right away if:  You have a fever.  You are leaking fluid from your vagina.  You have spotting or bleeding from your vagina.  You have severe abdominal cramping or pain.  You have rapid weight gain or loss.  You vomit blood or material that looks like coffee grounds.  You develop a severe headache.  You have shortness of breath.  You have any kind of trauma, such as from a fall or a car accident. Summary  The first trimester of pregnancy is from week 1 until the end of week 13 (months 1 through 3).  Your body goes through many changes during pregnancy. The changes vary from  woman to woman.  You will have routine prenatal visits. During those visits, your health care provider will examine you, discuss any test results you may have, and talk with you about how you are feeling. This information is not intended to replace advice given to you by your health care provider. Make sure you discuss any questions you have with your health care provider. Document Revised: 06/02/2017 Document Reviewed: 06/01/2016 Elsevier Patient Education  2020 Elsevier Inc.  

## 2019-09-17 NOTE — Progress Notes (Signed)
Subjective:   Julie [name] is a 24 y.o. G2P1001 at [redacted]w[redacted]d by LMP being seen today for her first obstetrical visit.  Her obstetrical history is significant for NSVD x 1, in 2020 and has Acne; Amenorrhea; Bipolar I disorder, moderate, current or most recent episode manic, in partial remission, with anxious distress ([place]); Acute vaginitis; Generalized anxiety disorder; Obsessive compulsive disorder; Pre-birth visit for expectant parents; Vaginal bleeding in pregnancy, third trimester; Labor abnormal; Spontaneous vaginal delivery; and Supervision of other normal pregnancy, antepartum on their problem list.. Patient does intend to breast feed. Pregnancy history fully reviewed.  Patient reports nausea and vaginal discharge with odor.  HISTORY: OB History  Gravida Para Term Preterm AB Living  2 1 1  0 0 1  SAB TAB Ectopic Multiple Live Births  0 0 0 0 1    # Outcome Date GA Lbr Len/2nd Weight Sex Delivery Anes PTL Lv  2 Current           1 Term 09/26/18 [redacted]w[redacted]d 13:26 / 01:37 7 lb 8.1 oz (3.405 kg) F Vag-Spont EPI  LIV     Name: Muecke,GIRL Leighla     Apgar1: 8  Apgar5: 9   Past Medical History:  Diagnosis Date  . Anxiety   . Asthma   . Depression    Past Surgical History:  Procedure Laterality Date  . NO PAST SURGERIES     Family History  Problem Relation Age of Onset  . Bipolar disorder Father   . Alcohol abuse Paternal Grandmother   . Healthy Mother    Social History   Tobacco Use  . Smoking status: Never Smoker  . Smokeless tobacco: Never Used  Substance Use Topics  . Alcohol use: No    Comment: hx of substance abuse.Denies all now  . Drug use: No    Comment: History of drug use/clean 3 months   No Known Allergies Current Outpatient Medications on File Prior to Visit  Medication Sig Dispense Refill  . Prenatal Vit-Fe Fumarate-FA (PRENATAL MULTIVITAMIN) TABS tablet Take 1 tablet by mouth daily at 12 noon.    Marland Kitchen omeprazole (PRILOSEC) 20 MG capsule Take 1 capsule  (20 mg total) by mouth daily. (Patient not taking: Reported on 09/06/2019) 30 capsule 0  . QUEtiapine (SEROQUEL XR) 200 MG 24 hr tablet Take 1 tablet (200 mg total) by mouth at bedtime. (Patient not taking: Reported on 09/06/2019) 30 tablet 0   No current facility-administered medications on file prior to visit.     Indications for ASA therapy (per uptodate) One of the following: Previous pregnancy with preeclampsia, especially early onset and with an adverse outcome No Multifetal gestation No Chronic hypertension No Type 1 or 2 diabetes mellitus No Chronic kidney disease No Autoimmune disease (antiphospholipid syndrome, systemic lupus erythematosus) No   Two or more of the following: Nulliparity No Obesity (body mass index >30 kg/m2) No Family history of preeclampsia in mother or sister No Age ?35 years No Sociodemographic characteristics (African American race, low socioeconomic level) No Personal risk factors (eg, previous pregnancy with low birth weight or small for gestational age infant, previous adverse pregnancy outcome [eg, stillbirth], interval >10 years between pregnancies) No   Indications for early 1 hour GTT (per uptodate)  BMI >25 (>23 in Asian women) AND one of the following  Gestational diabetes mellitus in a previous pregnancy No Glycated hemoglobin ?5.7 percent (39 mmol/mol), impaired glucose tolerance, or impaired fasting glucose on previous testing No First-degree relative with diabetes No  High-risk race/ethnicity (eg, African American, Latino, Native American, [place] American, [place] Islander) No History of cardiovascular disease No Hypertension or on therapy for hypertension No High-density lipoprotein cholesterol level <35 mg/dL (4.43 mmol/L) and/or a triglyceride level >250 mg/dL (1.54 mmol/L) No Polycystic ovary syndrome No Physical inactivity No Other clinical condition associated with insulin resistance (eg, severe obesity, acanthosis nigricans)  No Previous birth of an infant weighing ?4000 g No Previous stillbirth of unknown cause No Exam   Vitals:   09/17/19 1405  BP: 124/76  Pulse: 80  Weight: 181 lb 12.8 oz (82.5 kg)   Fetal Heart Rate (bpm): +sono  Uterus:     Pelvic Exam: Perineum: no hemorrhoids, normal perineum   Vulva: normal external genitalia, no lesions   Vagina:  normal mucosa, normal discharge   Cervix: no lesions and normal, pap smear done.    Adnexa: normal adnexa and no mass, fullness, tenderness   Bony Pelvis: average  System: General: well-developed, well-nourished female in no acute distress   Breast:  normal appearance, no masses or tenderness   Skin: normal coloration and turgor, no rashes   Neurologic: oriented, normal, negative, normal mood   Extremities: normal strength, tone, and muscle mass, ROM of all joints is normal   HEENT PERRLA, extraocular movement intact and sclera clear, anicteric   Mouth/Teeth mucous membranes moist, pharynx normal without lesions and dental hygiene good   Neck supple and no masses   Cardiovascular: regular rate and rhythm   Respiratory:  no respiratory distress, normal breath sounds   Abdomen: soft, non-tender; bowel sounds normal; no masses,  no organomegaly     Assessment:   Pregnancy: G2P1001 Patient Active Problem List   Diagnosis Date Noted  . Supervision of other normal pregnancy, antepartum 09/06/2019  . Labor abnormal 09/26/2018  . Spontaneous vaginal delivery 09/26/2018  . Vaginal bleeding in pregnancy, third trimester 09/25/2018  . Pre-birth visit for expectant parents 09/04/2018  . Generalized anxiety disorder 05/16/2018  . Obsessive compulsive disorder 05/16/2018  . Acute vaginitis 07/19/2017  . Bipolar I disorder, moderate, current or most recent episode manic, in partial remission, with anxious distress ([facility]) 09/10/2013  . Acne 07/26/2011  . Amenorrhea 07/26/2011     Plan:  1. Supervision of other normal pregnancy,  antepartum --Anticipatory guidance about next visits/weeks of pregnancy given. --Next visit tentatively in 4 weeks for genetic screening labs.   --Unable to hear FHT today but visualized on bedside US, dating [place] ordered --Will schedule pt follow up/next visit accordingly based on EDD   - Obstetric Panel, Including HIV - Culture, OB Urine - Genetic Screening - Cervicovaginal ancillary only( [facility]) - Cytology - PAP - [place] MFM OB COMP + 14 WK; Future  2. Unsure of LMP (last menstrual period) as reason for ultrasound scan  - US OB LESS THAN 14 WEEKS WITH OB TRANSVAGINAL; Future  3. Nausea and vomiting during pregnancy prior to [redacted] weeks gestation --Mostly nausea related to trigger foods, declines need for medication at this time  4. Family history of birth defect --FOB with family hx spinal cord defects.  Consider first trimester anatomy US.  Will get dating [place], then schedule follow up based on dates.     Initial labs drawn. Continue prenatal vitamins. Discussed and offered genetic screening options, including Quad screen/AFP, NIPS testing, and option to decline testing. Benefits/risks/alternatives reviewed. Pt aware that anatomy US is form of genetic screening with lower accuracy in detecting trisomies than blood work.  Pt chooses genetic screening  today. NIPS: requested.   Ultrasound discussed; fetal anatomic survey: requested. Problem list reviewed and updated. The nature of  - [facility] with multiple MDs and other Advanced Practice Providers was explained to patient; also emphasized that residents, students are part of our team. Routine obstetric precautions reviewed. Return in about 4 weeks (around 10/15/2019).   Sharen Counter, CNM 09/17/19 3:02 PM

## 2019-09-17 NOTE — Progress Notes (Signed)
Pt presents for NOB c/o malodorous discharge and nausea without vomiting.

## 2019-09-18 LAB — OBSTETRIC PANEL, INCLUDING HIV
Antibody Screen: NEGATIVE
Basophils Absolute: 0 10*3/uL (ref 0.0–0.2)
Basos: 0 %
EOS (ABSOLUTE): 0.1 10*3/uL (ref 0.0–0.4)
Eos: 1 %
HIV Screen 4th Generation wRfx: NONREACTIVE
Hematocrit: 36.2 % (ref 34.0–46.6)
Hemoglobin: 12.3 g/dL (ref 11.1–15.9)
Hepatitis B Surface Ag: NEGATIVE
Immature Grans (Abs): 0.1 10*3/uL (ref 0.0–0.1)
Immature Granulocytes: 1 %
Lymphocytes Absolute: 2.4 10*3/uL (ref 0.7–3.1)
Lymphs: 31 %
MCH: 29.9 pg (ref 26.6–33.0)
MCHC: 34 g/dL (ref 31.5–35.7)
MCV: 88 fL (ref 79–97)
Monocytes Absolute: 0.6 10*3/uL (ref 0.1–0.9)
Monocytes: 7 %
Neutrophils Absolute: 4.7 10*3/uL (ref 1.4–7.0)
Neutrophils: 60 %
Platelets: 291 10*3/uL (ref 150–450)
RBC: 4.11 x10E6/uL (ref 3.77–5.28)
RDW: 12.5 % (ref 11.7–15.4)
RPR Ser Ql: NONREACTIVE
Rh Factor: POSITIVE
Rubella Antibodies, IGG: 2.08 index (ref >0.99)
WBC: 7.7 10*3/uL (ref 3.4–10.8)

## 2019-09-18 LAB — CERVICOVAGINAL ANCILLARY ONLY
Bacterial Vaginitis (gardnerella): NEGATIVE
Candida Glabrata: NEGATIVE
Candida Vaginitis: NEGATIVE
Chlamydia: NEGATIVE
Comment: NEGATIVE
Comment: NEGATIVE
Comment: NEGATIVE
Comment: NEGATIVE
Comment: NEGATIVE
Comment: NORMAL
Neisseria Gonorrhea: NEGATIVE
Trichomonas: NEGATIVE

## 2019-09-19 LAB — CULTURE, OB URINE

## 2019-09-19 LAB — URINE CULTURE, OB REFLEX

## 2019-09-25 DIAGNOSIS — Z3480 Encounter for supervision of other normal pregnancy, unspecified trimester: Secondary | ICD-10-CM | POA: Diagnosis not present

## 2019-09-30 ENCOUNTER — Ambulatory Visit (HOSPITAL_COMMUNITY)
Admission: RE | Admit: 2019-09-30 | Discharge: 2019-09-30 | Disposition: A | Discharge status: Home / Self Care | Payer: Medicaid Other | Source: Ambulatory Visit | Attending: Advanced Practice Midwife | Admitting: Advanced Practice Midwife

## 2019-09-30 ENCOUNTER — Other Ambulatory Visit: Payer: Self-pay

## 2019-09-30 ENCOUNTER — Other Ambulatory Visit: Payer: Self-pay | Admitting: Advanced Practice Midwife

## 2019-09-30 DIAGNOSIS — Z3687 Encounter for antenatal screening for uncertain dates: Secondary | ICD-10-CM

## 2019-09-30 DIAGNOSIS — Z3A11 11 weeks gestation of pregnancy: Secondary | ICD-10-CM | POA: Diagnosis not present

## 2019-09-30 DIAGNOSIS — O219 Vomiting of pregnancy, unspecified: Secondary | ICD-10-CM | POA: Diagnosis not present

## 2019-10-01 ENCOUNTER — Telehealth: Payer: Self-pay | Admitting: Advanced Practice Midwife

## 2019-10-01 NOTE — Telephone Encounter (Signed)
New EDD by [place] yesterday, making pt [redacted]w[redacted]d today.  Pt to keep her virtual appointment on 4/13. Her next appointment will be in the office for AFP after 15 weeks, to screen for spinal cord defects.  Anatomy ultrasound is ordered for 5/4.  Left message for pt to return call and MyChart message sent.

## 2019-10-15 ENCOUNTER — Telehealth (INDEPENDENT_AMBULATORY_CARE_PROVIDER_SITE_OTHER): Payer: Medicaid Other | Admitting: Advanced Practice Midwife

## 2019-10-15 DIAGNOSIS — Z3481 Encounter for supervision of other normal pregnancy, first trimester: Secondary | ICD-10-CM

## 2019-10-15 DIAGNOSIS — Z3480 Encounter for supervision of other normal pregnancy, unspecified trimester: Secondary | ICD-10-CM

## 2019-10-15 DIAGNOSIS — Z3A13 13 weeks gestation of pregnancy: Secondary | ICD-10-CM | POA: Diagnosis not present

## 2019-10-15 NOTE — Progress Notes (Signed)
I connected with  Julie [name] Right on 10/15/19 by a video enabled telemedicine application and verified that I am speaking with the correct person using two identifiers.   I discussed the limitations of evaluation and management by telemedicine. The patient expressed understanding and agreed to proceed.  MyChart OB, reports no problems today.

## 2019-10-15 NOTE — Progress Notes (Signed)
OBSTETRICS PRENATAL VIRTUAL VISIT ENCOUNTER NOTE  Provider location: [facility] [place]   I connected with Leodis Rains on 10/15/19 at  8:55 AM EDT by MyChart Video Encounter at home and verified that I am speaking with the correct person using two identifiers.   I discussed the limitations, risks, security and privacy concerns of performing an evaluation and management service virtually and the availability of in person appointments. I also discussed with the patient that there may be a patient responsible charge related to this service. The patient expressed understanding and agreed to proceed. Subjective:  Julie [name] is a 24 y.o. G2P1001 at [redacted]w[redacted]d being seen today for ongoing prenatal care.  She is currently monitored for the following issues for this low-risk pregnancy and has Acne; Bipolar I disorder, moderate, current or most recent episode manic, in partial remission, with anxious distress ([place]); Generalized anxiety disorder; Obsessive compulsive disorder; and Supervision of other normal pregnancy, antepartum on their problem list.  Patient reports no complaints.  Contractions: Not present. Vag. Bleeding: None.   . Denies any leaking of fluid.   The following portions of the patient's history were reviewed and updated as appropriate: allergies, current medications, past family history, past medical history, past social history, past surgical history and problem list.   Objective:   Vitals:   10/15/19 0836  BP: 140/82  Pulse: 94   Retake BP 132/82  Fetal Status:           General:  Alert, oriented and cooperative. Patient is in no acute distress.  Respiratory: Normal respiratory effort, no problems with respiration noted  Mental Status: Normal mood and affect. Normal behavior. Normal judgment and thought content.  Rest of physical exam deferred due to type of encounter  Imaging: US OB Comp Less 14 Wks  Result Date: 09/30/2019 CLINICAL DATA:   First trimester pregnancy, unknown LMP, uncertain dating, nausea, vomiting EXAM: OBSTETRIC <14 WK ULTRASOUND TECHNIQUE: Transabdominal ultrasound was performed for evaluation of the gestation as well as the maternal uterus and adnexal regions. COMPARISON:  None for this gestation FINDINGS: Intrauterine gestational sac: Present, single Yolk sac:  Not identified Embryo:  Present Cardiac Activity: Present Heart Rate: 173 bpm CRL:   44.7 mm   11 w 1 d                  [place] EDC: 04/19/2020 Subchorionic hemorrhage:  None visualized. Maternal uterus/adnexae: RIGHT ovary normal size and morphology, 2.5 x 0.9 x 1.6 cm. LEFT ovary normal size and morphology, 1.7 x 2.2 x 1.4 cm. No free pelvic fluid or adnexal masses. IMPRESSION: Single live intrauterine gestation at 11 weeks 1 day EGA by crown-rump length. No acute abnormalities. Electronically Signed   By: Lavonia Dana M.D.   On: 09/30/2019 15:20    Assessment and Plan:  Pregnancy: G2P1001 at [redacted]w[redacted]d 1. Supervision of other normal pregnancy, antepartum --Anticipatory guidance about next visits/weeks of pregnancy given. --Home cuff BP 140/82, then retake 132/82 today, no hx HTN. Watch BP in pregnancy.  Discussed Babyscripts, pt to enter weekly BP in app. --Next visit in 2 weeks for lab only to collect genetic screening with [facility], then in 6 weeks for virtual visit  Preterm labor symptoms and general obstetric precautions including but not limited to vaginal bleeding, contractions, leaking of fluid and fetal movement were reviewed in detail with the patient. I discussed the assessment and treatment plan with the patient. The patient was provided an opportunity to ask  questions and all were answered. The patient agreed with the plan and demonstrated an understanding of the instructions. The patient was advised to call back or seek an in-person office evaluation/go to [facility] at [facility] for any urgent or concerning symptoms. Please refer to After Visit  Summary for other counseling recommendations.   I provided 10 minutes of face-to-face time during this encounter.  No follow-ups on file.  Future Appointments  Date Time Provider Department Center  11/05/2019 12:45 PM WH-MFC [place] 2 WH-MFCUS M[facility]-US    Sharen Counter, CNM [facility] [employer], [facility]

## 2019-10-29 ENCOUNTER — Other Ambulatory Visit: Payer: Medicaid Other

## 2019-10-29 ENCOUNTER — Other Ambulatory Visit: Payer: Self-pay

## 2019-10-29 DIAGNOSIS — Z3480 Encounter for supervision of other normal pregnancy, unspecified trimester: Secondary | ICD-10-CM | POA: Diagnosis not present

## 2019-10-29 DIAGNOSIS — Z3482 Encounter for supervision of other normal pregnancy, second trimester: Secondary | ICD-10-CM | POA: Diagnosis not present

## 2019-10-31 LAB — AFP, SERUM, OPEN SPINA BIFIDA
AFP MoM: 0.9
AFP Value: 25 ng/mL
Gest. Age on Collection Date: 15.3 weeks
Maternal Age At EDD: 23.1 yr
OSBR Risk 1 IN: 10000
Test Results:: NEGATIVE
Weight: 181 [lb_av]

## 2019-11-04 ENCOUNTER — Encounter: Payer: Self-pay | Admitting: Obstetrics and Gynecology

## 2019-11-05 ENCOUNTER — Ambulatory Visit
Admission: RE | Admit: 2019-11-05 | Payer: Medicaid Other | Source: Ambulatory Visit | Attending: Advanced Practice Midwife | Admitting: Advanced Practice Midwife

## 2019-11-07 ENCOUNTER — Encounter: Payer: Self-pay | Admitting: Obstetrics and Gynecology

## 2019-11-25 ENCOUNTER — Ambulatory Visit: Payer: Medicaid Other

## 2019-11-26 ENCOUNTER — Telehealth (INDEPENDENT_AMBULATORY_CARE_PROVIDER_SITE_OTHER): Payer: Medicaid Other | Admitting: Advanced Practice Midwife

## 2019-11-26 DIAGNOSIS — M79671 Pain in right foot: Secondary | ICD-10-CM

## 2019-11-26 DIAGNOSIS — Z3A19 19 weeks gestation of pregnancy: Secondary | ICD-10-CM

## 2019-11-26 DIAGNOSIS — O99342 Other mental disorders complicating pregnancy, second trimester: Secondary | ICD-10-CM

## 2019-11-26 DIAGNOSIS — Z3480 Encounter for supervision of other normal pregnancy, unspecified trimester: Secondary | ICD-10-CM

## 2019-11-26 NOTE — Progress Notes (Signed)
   OBSTETRICS PRENATAL VIRTUAL VISIT ENCOUNTER NOTE  Provider location: Center for [facility]   I connected with Zane Herald on 11/26/19 at  9:15 AM EDT by MyChart Video Encounter at home and verified that I am speaking with the correct person using two identifiers.   I discussed the limitations, risks, security and privacy concerns of performing an evaluation and management service virtually and the availability of in person appointments. I also discussed with the patient that there may be a patient responsible charge related to this service. The patient expressed understanding and agreed to proceed. Subjective:  Julie [name] is a 24 y.o. G2P1001 at [redacted]w[redacted]d being seen today for ongoing prenatal care.  She is currently monitored for the following issues for this low-risk pregnancy and has Acne; Bipolar I disorder, moderate, current or most recent episode manic, in partial remission, with anxious distress (HCC); Generalized anxiety disorder; Obsessive compulsive disorder; and Supervision of other normal pregnancy, antepartum on their problem list.  Patient reports no complaints.  Contractions: Not present. Vag. Bleeding: None.   . Denies any leaking of fluid.   The following portions of the patient's history were reviewed and updated as appropriate: allergies, current medications, past family history, past medical history, past social history, past surgical history and problem list.   Objective:  There were no vitals filed for this visit.  Fetal Status:           General:  Alert, oriented and cooperative. Patient is in no acute distress.  Respiratory: Normal respiratory effort, no problems with respiration noted  Mental Status: Normal mood and affect. Normal behavior. Normal judgment and thought content.  Rest of physical exam deferred due to type of encounter  Imaging: No results found.  Assessment and Plan:  Pregnancy: G2P1001 at [redacted]w[redacted]d 1. Supervision of  other normal pregnancy, antepartum --Pt reports fetal movement, denies cramping, LOF, or vaginal bleeding --Anticipatory guidance about next visits/weeks of pregnancy given. --Next visit in 4 weeks, virtual, then at 27 weeks in office for GTT   2. Bilateral foot pain --On bottom of feet after prolonged standing, spends a lot of time barefoot. --Recommend good arch support in pregnancy, as arches soften with relaxin and can collapse. Reduce prolonged standing and wear supportive shoes when possible.  Preterm labor symptoms and general obstetric precautions including but not limited to vaginal bleeding, contractions, leaking of fluid and fetal movement were reviewed in detail with the patient. I discussed the assessment and treatment plan with the patient. The patient was provided an opportunity to ask questions and all were answered. The patient agreed with the plan and demonstrated an understanding of the instructions. The patient was advised to call back or seek an in-person office evaluation/go to MAU at [facility] for any urgent or concerning symptoms. Please refer to After Visit Summary for other counseling recommendations.   I provided 7 minutes of face-to-face time during this encounter.  Return in about 4 weeks (around 12/24/2019).  Future Appointments  Date Time Provider Department Center  11/29/2019 12:45 PM [facility] US4 [facility]    Sharen Counter, CNM Center for [employer], [facility]

## 2019-11-29 ENCOUNTER — Ambulatory Visit: Payer: Medicaid Other | Attending: Advanced Practice Midwife

## 2019-11-29 ENCOUNTER — Other Ambulatory Visit: Payer: Self-pay

## 2019-11-29 DIAGNOSIS — Z3480 Encounter for supervision of other normal pregnancy, unspecified trimester: Secondary | ICD-10-CM | POA: Insufficient documentation

## 2019-11-29 DIAGNOSIS — Z3A19 19 weeks gestation of pregnancy: Secondary | ICD-10-CM | POA: Diagnosis not present

## 2019-11-29 DIAGNOSIS — Z363 Encounter for antenatal screening for malformations: Secondary | ICD-10-CM

## 2019-12-24 ENCOUNTER — Telehealth (INDEPENDENT_AMBULATORY_CARE_PROVIDER_SITE_OTHER): Payer: Medicaid Other | Admitting: Obstetrics and Gynecology

## 2019-12-24 ENCOUNTER — Telehealth: Payer: Medicaid Other | Admitting: Advanced Practice Midwife

## 2019-12-24 ENCOUNTER — Encounter: Payer: Self-pay | Admitting: Obstetrics and Gynecology

## 2019-12-24 DIAGNOSIS — Z3482 Encounter for supervision of other normal pregnancy, second trimester: Secondary | ICD-10-CM

## 2019-12-24 DIAGNOSIS — Z3480 Encounter for supervision of other normal pregnancy, unspecified trimester: Secondary | ICD-10-CM

## 2019-12-24 NOTE — Progress Notes (Signed)
I connected with  Julie [name] on 12/24/19 by a video enabled telemedicine application and verified that I am speaking with the correct person using two identifiers.   I discussed the limitations of evaluation and management by telemedicine. The patient expressed understanding and agreed to proceed.  MyChart OB, reports no complaints today.

## 2019-12-24 NOTE — Progress Notes (Signed)
Subjective:  Julie [name] is a 24 y.o. G2P1001 at [redacted]w[redacted]d being seen today for ongoing prenatal care.  She is currently monitored for the following issues for this low-risk pregnancy and has Acne; Bipolar I disorder, moderate, current or most recent episode manic, in partial remission, with anxious distress (HCC); Generalized anxiety disorder; Obsessive compulsive disorder; and Supervision of other normal pregnancy, antepartum on their problem list.  Patient reports no complaints.  Contractions: Not present. Vag. Bleeding: None.  Movement: Present. Denies leaking of fluid.   The following portions of the patient's history were reviewed and updated as appropriate: allergies, current medications, past family history, past medical history, past social history, past surgical history and problem list. Problem list updated.  Objective:   Vitals:   12/24/19 1505  BP: 133/77  Pulse: (!) 116    Fetal Status:     Movement: Present     General:  Alert, oriented and cooperative. Patient is in no acute distress.  Skin: Skin is warm and dry. No rash noted.   Cardiovascular: Normal heart rate noted  Respiratory: Normal respiratory effort, no problems with respiration noted  Abdomen: Soft, gravid, appropriate for gestational age. Pain/Pressure: Absent     Pelvic:  Cervical exam deferred        Extremities: Normal range of motion.  Edema: None  Mental Status: Normal mood and affect. Normal behavior. Normal judgment and thought content.   Urinalysis:      Assessment and Plan:  Pregnancy: G2P1001 at [redacted]w[redacted]d  1. Supervision of other normal pregnancy, antepartum Stable Glucola next visit  Preterm labor symptoms and general obstetric precautions including but not limited to vaginal bleeding, contractions, leaking of fluid and fetal movement were reviewed in detail with the patient. Please refer to After Visit Summary for other counseling recommendations.  Return in about 4 weeks (around 01/21/2020)  for OB visit, face to face, any provider, Glucola.   Hermina Staggers, MD

## 2020-01-21 ENCOUNTER — Encounter: Payer: Self-pay | Admitting: Family Medicine

## 2020-01-21 ENCOUNTER — Other Ambulatory Visit: Payer: Medicaid Other

## 2020-01-21 ENCOUNTER — Other Ambulatory Visit: Payer: Self-pay

## 2020-01-21 ENCOUNTER — Ambulatory Visit (INDEPENDENT_AMBULATORY_CARE_PROVIDER_SITE_OTHER): Payer: Medicaid Other | Admitting: Family Medicine

## 2020-01-21 DIAGNOSIS — Z3480 Encounter for supervision of other normal pregnancy, unspecified trimester: Secondary | ICD-10-CM | POA: Diagnosis not present

## 2020-01-21 DIAGNOSIS — Z23 Encounter for immunization: Secondary | ICD-10-CM | POA: Diagnosis not present

## 2020-01-21 DIAGNOSIS — Z3482 Encounter for supervision of other normal pregnancy, second trimester: Secondary | ICD-10-CM

## 2020-01-21 DIAGNOSIS — Z3A27 27 weeks gestation of pregnancy: Secondary | ICD-10-CM

## 2020-01-21 NOTE — Progress Notes (Signed)
Pt states she is doing well, occ cramping.  Pt would like Tdap today- given.

## 2020-01-21 NOTE — Progress Notes (Signed)
   Subjective:  Julie [name] is a 24 y.o. G2P1001 at [redacted]w[redacted]d being seen today for ongoing prenatal care.  She is currently monitored for the following issues for this low-risk pregnancy and has Acne; Bipolar I disorder, moderate, current or most recent episode manic, in partial remission, with anxious distress (HCC); Generalized anxiety disorder; Obsessive compulsive disorder; and Supervision of other normal pregnancy, antepartum on their problem list.  Patient reports no complaints.  Contractions: Not present. Vag. Bleeding: None.  Movement: Present. Denies leaking of fluid.   The following portions of the patient's history were reviewed and updated as appropriate: allergies, current medications, past family history, past medical history, past social history, past surgical history and problem list. Problem list updated.  Objective:   Vitals:   01/21/20 0902  BP: 116/72  Pulse: 87  Weight: 195 lb (88.5 kg)    Fetal Status: Fetal Heart Rate (bpm): 140 Fundal Height: 28 cm Movement: Present     General:  Alert, oriented and cooperative. Patient is in no acute distress.  Skin: Skin is warm and dry. No rash noted.   Cardiovascular: Normal heart rate noted  Respiratory: Normal respiratory effort, no problems with respiration noted  Abdomen: Soft, gravid, appropriate for gestational age. Pain/Pressure: Absent     Pelvic: Vag. Bleeding: None     Cervical exam deferred        Extremities: Normal range of motion.  Edema: None  Mental Status: Normal mood and affect. Normal behavior. Normal judgment and thought content.   Urinalysis:      Assessment and Plan:  Pregnancy: G2P1001 at [redacted]w[redacted]d  1. Supervision of other normal pregnancy, antepartum Doing well TDAP given today Discussed contraception, would like Nexplanon, PL updated Reports she has already received COVID vaccine 3rd trimester labs/GTT obtained today F/u in 2 weeks - Glucose Tolerance, 2 Hours w/1 Hour - CBC - HIV Antibody  (routine testing w rflx) - RPR - Tdap vaccine greater than or equal to 7yo IM  Preterm labor symptoms and general obstetric precautions including but not limited to vaginal bleeding, contractions, leaking of fluid and fetal movement were reviewed in detail with the patient. Please refer to After Visit Summary for other counseling recommendations.  Return in 2 weeks (on 02/04/2020) for [facility], ob visit.   Venora Maples, MD

## 2020-01-22 ENCOUNTER — Other Ambulatory Visit: Payer: Self-pay | Admitting: Family Medicine

## 2020-01-22 LAB — CBC
Hematocrit: 31.4 % — ABNORMAL LOW (ref 34.0–46.6)
Hemoglobin: 10 g/dL — ABNORMAL LOW (ref 11.1–15.9)
MCH: 28.8 pg (ref 26.6–33.0)
MCHC: 31.8 g/dL (ref 31.5–35.7)
MCV: 91 fL (ref 79–97)
Platelets: 272 10*3/uL (ref 150–450)
RBC: 3.47 x10E6/uL — ABNORMAL LOW (ref 3.77–5.28)
RDW: 12.7 % (ref 11.7–15.4)
WBC: 8 10*3/uL (ref 3.4–10.8)

## 2020-01-22 LAB — GLUCOSE TOLERANCE, 2 HOURS W/ 1HR
Glucose, 1 hour: 124 mg/dL (ref 65–179)
Glucose, 2 hour: 116 mg/dL (ref 65–152)
Glucose, Fasting: 76 mg/dL (ref 65–91)

## 2020-01-22 LAB — RPR: RPR Ser Ql: NONREACTIVE

## 2020-01-22 LAB — HIV ANTIBODY (ROUTINE TESTING W REFLEX): HIV Screen 4th Generation wRfx: NONREACTIVE

## 2020-01-22 MED ORDER — FERROUS SULFATE 325 (65 FE) MG PO TBEC: 325.0000 mg | DELAYED_RELEASE_TABLET | ORAL | 2 refills | Status: DC

## 2020-01-22 NOTE — Progress Notes (Signed)
Iron rx

## 2020-02-04 ENCOUNTER — Ambulatory Visit (INDEPENDENT_AMBULATORY_CARE_PROVIDER_SITE_OTHER): Payer: Medicaid Other | Admitting: Obstetrics

## 2020-02-04 ENCOUNTER — Encounter: Payer: Self-pay | Admitting: Obstetrics

## 2020-02-04 VITALS — BP 123/72 | HR 87 | Wt 197.8 lb

## 2020-02-04 DIAGNOSIS — M549 Dorsalgia, unspecified: Secondary | ICD-10-CM

## 2020-02-04 DIAGNOSIS — O26893 Other specified pregnancy related conditions, third trimester: Secondary | ICD-10-CM

## 2020-02-04 DIAGNOSIS — Z3480 Encounter for supervision of other normal pregnancy, unspecified trimester: Secondary | ICD-10-CM

## 2020-02-04 DIAGNOSIS — Z3A29 29 weeks gestation of pregnancy: Secondary | ICD-10-CM

## 2020-02-04 MED ORDER — COMFORT FIT MATERNITY SUPP SM MISC: 0 refills | Status: DC

## 2020-02-04 NOTE — Progress Notes (Signed)
Patient presents for ROB. Patient has no concerns today. 

## 2020-02-04 NOTE — Progress Notes (Signed)
Subjective:  Julie [name] is a 24 y.o. G2P1001 at [redacted]w[redacted]d being seen today for ongoing prenatal care.  She is currently monitored for the following issues for this low-risk pregnancy and has Acne; Bipolar I disorder, moderate, current or most recent episode manic, in partial remission, with anxious distress (HCC); Generalized anxiety disorder; Obsessive compulsive disorder; and Supervision of other normal pregnancy, antepartum on their problem list.  Patient reports backache.  Contractions: Not present. Vag. Bleeding: None.  Movement: Present. Denies leaking of fluid.   The following portions of the patient's history were reviewed and updated as appropriate: allergies, current medications, past family history, past medical history, past social history, past surgical history and problem list. Problem list updated.  Objective:   Vitals:   02/04/20 1323  BP: 123/72  Pulse: 87  Weight: 197 lb 12.8 oz (89.7 kg)    Fetal Status:     Movement: Present     General:  Alert, oriented and cooperative. Patient is in no acute distress.  Skin: Skin is warm and dry. No rash noted.   Cardiovascular: Normal heart rate noted  Respiratory: Normal respiratory effort, no problems with respiration noted  Abdomen: Soft, gravid, appropriate for gestational age. Pain/Pressure: Present     Pelvic:  Cervical exam deferred        Extremities: Normal range of motion.  Edema: None  Mental Status: Normal mood and affect. Normal behavior. Normal judgment and thought content.   Urinalysis:      Assessment and Plan:  Pregnancy: G2P1001 at [redacted]w[redacted]d  1. Supervision of other normal pregnancy, antepartum  2. Backache symptom Rx: - Elastic Bandages & Supports (COMFORT FIT MATERNITY SUPP SM) MISC; Wear as directed.  Dispense: 1 each; Refill: 0    Preterm labor symptoms and general obstetric precautions including but not limited to vaginal bleeding, contractions, leaking of fluid and fetal movement were reviewed in  detail with the patient. Please refer to After Visit Summary for other counseling recommendations.    Return in about 2 weeks (around 02/18/2020) for MyChart.   Brock Bad, MD  02/04/20

## 2020-02-18 ENCOUNTER — Telehealth: Payer: Medicaid Other | Admitting: Obstetrics

## 2020-02-26 ENCOUNTER — Encounter (HOSPITAL_COMMUNITY): Payer: Self-pay | Admitting: *Deleted

## 2020-02-26 ENCOUNTER — Other Ambulatory Visit: Payer: Self-pay

## 2020-02-26 ENCOUNTER — Emergency Department (HOSPITAL_COMMUNITY)
Admission: EM | Admit: 2020-02-26 | Discharge: 2020-02-26 | Disposition: A | Discharge status: Home / Self Care | Payer: Medicaid Other | Attending: Emergency Medicine | Admitting: Emergency Medicine

## 2020-02-26 DIAGNOSIS — M25519 Pain in unspecified shoulder: Secondary | ICD-10-CM | POA: Insufficient documentation

## 2020-02-26 DIAGNOSIS — Z5321 Procedure and treatment not carried out due to patient leaving prior to being seen by health care provider: Secondary | ICD-10-CM | POA: Diagnosis not present

## 2020-02-26 DIAGNOSIS — O99891 Other specified diseases and conditions complicating pregnancy: Secondary | ICD-10-CM | POA: Insufficient documentation

## 2020-02-26 DIAGNOSIS — M542 Cervicalgia: Secondary | ICD-10-CM | POA: Diagnosis not present

## 2020-02-26 DIAGNOSIS — Z3A32 32 weeks gestation of pregnancy: Secondary | ICD-10-CM | POA: Insufficient documentation

## 2020-02-26 NOTE — ED Triage Notes (Signed)
Pt arrived by [facility] for neck pain. Woke up this am with sharp neck pain that radiates into her shoulder/arm. Pain increases with movement and turning her head. Denies any injury. Pt is approx [redacted] weeks pregnant. Has no ob/gyn complaints and reports +fetal movement.

## 2020-02-26 NOTE — ED Notes (Signed)
Called pt name x3 in [place]. No response from pt.

## 2020-03-02 ENCOUNTER — Telehealth: Payer: Self-pay | Admitting: *Deleted

## 2020-03-02 NOTE — Telephone Encounter (Signed)
Julie [name] presented to the ED and left before being seen by the provider on 02/26/20. The patient has been enrolled in an automated general discharge outreach program and 2 attempts to contact the patient will be made to follow up on their ED visit and subsequent needs. The care management team is available to provide assistance to this patient at any time.   Burnard Bunting, RN, BSN, CCRN [facility] [phone]

## 2020-03-03 ENCOUNTER — Telehealth (INDEPENDENT_AMBULATORY_CARE_PROVIDER_SITE_OTHER): Payer: Medicaid Other

## 2020-03-03 VITALS — BP 112/71 | HR 102

## 2020-03-03 DIAGNOSIS — F411 Generalized anxiety disorder: Secondary | ICD-10-CM

## 2020-03-03 DIAGNOSIS — Z3A33 33 weeks gestation of pregnancy: Secondary | ICD-10-CM

## 2020-03-03 DIAGNOSIS — Z3480 Encounter for supervision of other normal pregnancy, unspecified trimester: Secondary | ICD-10-CM

## 2020-03-03 NOTE — Patient Instructions (Signed)

## 2020-03-03 NOTE — Progress Notes (Signed)
   OBSTETRICS PRENATAL VIRTUAL VISIT ENCOUNTER NOTE  Provider location: [facility] for [facility]   I connected with Julie [name] on 03/03/20 at 10:55 AM EDT by MyChart Video Encounter at home and verified that I am speaking with the correct person using two identifiers.   I discussed the limitations, risks, security and privacy concerns of performing an evaluation and management service virtually and the availability of in person appointments. I also discussed with the patient that there may be a patient responsible charge related to this service. The patient expressed understanding and agreed to proceed. Subjective:  Julie [name] is a 24 y.o. G2P1001 at [redacted]w[redacted]d being seen today for ongoing prenatal care.  She is currently monitored for the following issues for this low-risk pregnancy and has Acne; Bipolar I disorder, moderate, current or most recent episode manic, in partial remission, with anxious distress (HCC); Generalized anxiety disorder; Obsessive compulsive disorder; and Supervision of other normal pregnancy, antepartum on their problem list.  Patient reports no complaints. Patient states the back pain has improved despite not picking up maternity belt.  She states her symptoms may have been from her sleeping position.  Patient endorses fetal movement and contractions.  She denies vaginal concerns including discharge, bleeding, and leaking.  Contractions: Not present. Vag. Bleeding: None.  Movement: Present. Denies any leaking of fluid.   The following portions of the patient's history were reviewed and updated as appropriate: allergies, current medications, past family history, past medical history, past social history, past surgical history and problem list.   Objective:   Vitals:   03/03/20 1051  BP: 112/71  Pulse: (!) 102    Fetal Status:     Movement: Present     General:  Alert, oriented and cooperative. Patient is in no acute distress.  Respiratory:  Normal respiratory effort, no problems with respiration noted  Mental Status: Normal mood and affect. Normal behavior. Normal judgment and thought content.  Rest of physical exam deferred due to type of encounter  Imaging: No results found.  Assessment and Plan:  Pregnancy: G2P1001 at [redacted]w[redacted]d 1. Supervision of other normal pregnancy, antepartum -Anticipatory guidance for upcoming appts.  -Educated on GBS bacteria including what it is, why we test, and how and when we treat if needed. -Discussed releasing of results to mychart.  2. Generalized anxiety disorder -Patient states she feels fine. -Patient denies consult with BH at this time.  -Informed of availability of LCSW for any mental health needs and/or concerns.   3. [redacted] weeks gestation of pregnancy -RTO in 3 weeks for GBS.  Preterm labor symptoms and general obstetric precautions including but not limited to vaginal bleeding, contractions, leaking of fluid and fetal movement were reviewed in detail with the patient. I discussed the assessment and treatment plan with the patient. The patient was provided an opportunity to ask questions and all were answered. The patient agreed with the plan and demonstrated an understanding of the instructions. The patient was advised to call back or seek an in-person office evaluation/go to MAU at [facility] for any urgent or concerning symptoms. Please refer to After Visit Summary for other counseling recommendations.   I provided 6 minutes of face-to-face time during this encounter.  No follow-ups on file.  No future appointments.  Cherre Robins, CNM Center for [employer], [facility]

## 2020-03-13 ENCOUNTER — Other Ambulatory Visit: Payer: Self-pay

## 2020-03-13 ENCOUNTER — Inpatient Hospital Stay (HOSPITAL_COMMUNITY)
Admission: AD | Admit: 2020-03-13 | Discharge: 2020-03-13 | Disposition: A | Discharge status: Home / Self Care | Payer: Medicaid Other | Attending: Obstetrics & Gynecology | Admitting: Obstetrics & Gynecology

## 2020-03-13 ENCOUNTER — Encounter (HOSPITAL_COMMUNITY): Payer: Self-pay | Admitting: Obstetrics & Gynecology

## 2020-03-13 DIAGNOSIS — R109 Unspecified abdominal pain: Secondary | ICD-10-CM | POA: Insufficient documentation

## 2020-03-13 DIAGNOSIS — O4703 False labor before 37 completed weeks of gestation, third trimester: Secondary | ICD-10-CM | POA: Insufficient documentation

## 2020-03-13 DIAGNOSIS — Z3480 Encounter for supervision of other normal pregnancy, unspecified trimester: Secondary | ICD-10-CM

## 2020-03-13 DIAGNOSIS — O4693 Antepartum hemorrhage, unspecified, third trimester: Secondary | ICD-10-CM | POA: Diagnosis not present

## 2020-03-13 DIAGNOSIS — M549 Dorsalgia, unspecified: Secondary | ICD-10-CM | POA: Insufficient documentation

## 2020-03-13 DIAGNOSIS — Z3A34 34 weeks gestation of pregnancy: Secondary | ICD-10-CM | POA: Insufficient documentation

## 2020-03-13 DIAGNOSIS — O26893 Other specified pregnancy related conditions, third trimester: Secondary | ICD-10-CM

## 2020-03-13 LAB — URINALYSIS, ROUTINE W REFLEX MICROSCOPIC
Bilirubin Urine: NEGATIVE
Glucose, UA: >=500 mg/dL — AB
Hgb urine dipstick: NEGATIVE
Ketones, ur: NEGATIVE mg/dL
Nitrite: NEGATIVE
Protein, ur: 30 mg/dL — AB
Specific Gravity, Urine: 1.026 (ref 1.005–1.030)
pH: 7 (ref 5.0–8.0)

## 2020-03-13 LAB — FETAL FIBRONECTIN: Fetal Fibronectin: NEGATIVE

## 2020-03-13 NOTE — MAU Note (Signed)
Woke up this morning, "back is just killing her".  Was worried it is back labor,because now it is coming to the front.  Is super nauseated, has thrown up twice. No hx of PTL.  No bleeding or leaking.

## 2020-03-13 NOTE — Discharge Instructions (Signed)
Braxton Hicks Contractions °Contractions of the uterus can occur throughout pregnancy, but they are not always a sign that you are in labor. You may have practice contractions called Braxton Hicks contractions. These false labor contractions are sometimes confused with true labor. °What are Braxton Hicks contractions? °Braxton Hicks contractions are tightening movements that occur in the muscles of the uterus before labor. Unlike true labor contractions, these contractions do not result in opening (dilation) and thinning of the cervix. Toward the end of pregnancy (32-34 weeks), Braxton Hicks contractions can happen more often and may become stronger. These contractions are sometimes difficult to tell apart from true labor because they can be very uncomfortable. You should not feel embarrassed if you go to the hospital with false labor. °Sometimes, the only way to tell if you are in true labor is for your health care provider to look for changes in the cervix. The health care provider will do a physical exam and may monitor your contractions. If you are not in true labor, the exam should show that your cervix is not dilating and your water has not broken. °If there are no other health problems associated with your pregnancy, it is completely safe for you to be sent home with false labor. You may continue to have Braxton Hicks contractions until you go into true labor. °How to tell the difference between true labor and false labor °True labor °· Contractions last 30-70 seconds. °· Contractions become very regular. °· Discomfort is usually felt in the top of the uterus, and it spreads to the lower abdomen and low back. °· Contractions do not go away with walking. °· Contractions usually become more intense and increase in frequency. °· The cervix dilates and gets thinner. °False labor °· Contractions are usually shorter and not as strong as true labor contractions. °· Contractions are usually irregular. °· Contractions  are often felt in the front of the lower abdomen and in the groin. °· Contractions may go away when you walk around or change positions while lying down. °· Contractions get weaker and are shorter-lasting as time goes on. °· The cervix usually does not dilate or become thin. °Follow these instructions at home: ° °· Take over-the-counter and prescription medicines only as told by your health care provider. °· Keep up with your usual exercises and follow other instructions from your health care provider. °· Eat and drink lightly if you think you are going into labor. °· If Braxton Hicks contractions are making you uncomfortable: °? Change your position from lying down or resting to walking, or change from walking to resting. °? Sit and rest in a tub of warm water. °? Drink enough fluid to keep your urine pale yellow. Dehydration may cause these contractions. °? Do slow and deep breathing several times an hour. °· Keep all follow-up prenatal visits as told by your health care provider. This is important. °Contact a health care provider if: °· You have a fever. °· You have continuous pain in your abdomen. °Get help right away if: °· Your contractions become stronger, more regular, and closer together. °· You have fluid leaking or gushing from your vagina. °· You pass blood-tinged mucus (bloody show). °· You have bleeding from your vagina. °· You have low back pain that you never had before. °· You feel your baby’s head pushing down and causing pelvic pressure. °· Your baby is not moving inside you as much as it used to. °Summary °· Contractions that occur before labor are   called Braxton Hicks contractions, false labor, or practice contractions. °· Braxton Hicks contractions are usually shorter, weaker, farther apart, and less regular than true labor contractions. True labor contractions usually become progressively stronger and regular, and they become more frequent. °· Manage discomfort from Braxton Hicks contractions  by changing position, resting in a warm bath, drinking plenty of water, or practicing deep breathing. °This information is not intended to replace advice given to you by your health care provider. Make sure you discuss any questions you have with your health care provider. °Document Revised: 06/02/2017 Document Reviewed: 11/03/2016 °Elsevier Patient Education © 2020 [employer] ° °

## 2020-03-13 NOTE — MAU Provider Note (Signed)
Chief Complaint:  Vaginal Bleeding, Abdominal Pain, and Nausea   None     HPI: Julie [name] is a 24 y.o. G2P1001 at [redacted]w[redacted]d by early ultrasound who presents to maternity admissions reporting back pain intermittently starting today, radiating to her low abdomen and associated with nausea. The pain has improved and is almost gone while patient is in MAU. She reports good fetal movement.   Location: low back, low abdomen Quality: cramping Severity: 6/10 on pain scale Duration: 1 day Timing: intermittent Modifying factors: none Associated signs and symptoms: nausea  HPI  Past Medical History: Past Medical History:  Diagnosis Date  . Anxiety   . Asthma   . Depression     Past obstetric history: OB History  Gravida Para Term Preterm AB Living  2 1 1     1   SAB TAB Ectopic Multiple Live Births        0 1    # Outcome Date GA Lbr Len/2nd Weight Sex Delivery Anes PTL Lv  2 Current           1 Term 09/26/18 [redacted]w[redacted]d 13:26 / 01:37 3405 g F Vag-Spont EPI  LIV    Past Surgical History: Past Surgical History:  Procedure Laterality Date  . NO PAST SURGERIES      Family History: Family History  Problem Relation Age of Onset  . Bipolar disorder Father   . Alcohol abuse Paternal Grandmother   . Healthy Mother     Social History: Social History   Tobacco Use  . Smoking status: Never Smoker  . Smokeless tobacco: Never Used  Vaping Use  . Vaping Use: Never used  Substance Use Topics  . Alcohol use: No    Comment: hx of substance abuse.Denies all now  . Drug use: No    Comment: History of drug use/clean 3 months    Allergies: No Known Allergies  Meds:  Medications Prior to Admission  Medication Sig Dispense Refill Last Dose  . ferrous sulfate 325 (65 FE) MG EC tablet Take 1 tablet (325 mg total) by mouth every other day. 30 tablet 2 03/12/2020 at Unknown time  . Prenatal Vit-Fe Fumarate-FA (PRENATAL MULTIVITAMIN) TABS tablet Take 1 tablet by mouth daily at 12 noon.    03/12/2020 at Unknown time  . Elastic Bandages & Supports (COMFORT FIT MATERNITY SUPP SM) MISC Wear as directed. 1 each 0     ROS:  Review of Systems  Constitutional: Negative for chills, fatigue and fever.  Eyes: Negative for visual disturbance.  Respiratory: Negative for shortness of breath.   Cardiovascular: Negative for chest pain.  Gastrointestinal: Positive for abdominal pain and nausea. Negative for vomiting.  Genitourinary: Negative for difficulty urinating, dysuria, flank pain, pelvic pain, vaginal bleeding, vaginal discharge and vaginal pain.  Musculoskeletal: Positive for back pain.  Neurological: Negative for dizziness and headaches.  Psychiatric/Behavioral: Negative.      I have reviewed patient's Past Medical Hx, Surgical Hx, Family Hx, Social Hx, medications and allergies.   Physical Exam   Patient Vitals for the past 24 hrs:  BP Temp Temp src Pulse Resp SpO2 Height Weight  03/13/20 1420 126/68 98.8 F (37.1 C) Oral (!) 108 17 100 % 5\' 5"  (1.651 m) 91.5 kg   Constitutional: Well-developed, well-nourished female in no acute distress.  Cardiovascular: normal rate Respiratory: normal effort GI: Abd soft, non-tender, gravid appropriate for gestational age.  MS: Extremities nontender, no edema, normal ROM Neurologic: Alert and oriented x 4.  GU: Neg CVAT.  PELVIC EXAM: Cervix pink, visually closed, without lesion, scant white creamy discharge, vaginal walls and external genitalia normal Bimanual exam: Cervix 0/long/high, firm, anterior, neg CMT, uterus nontender, nonenlarged, adnexa without tenderness, enlargement, or mass  Dilation: 1.5 Effacement (%): 50 Cervical Position: Posterior Presentation: Vertex Exam by:: Sharen Counter, CNM  FHT:  Baseline 145 , moderate variability, accelerations present, no decelerations Contractions: None on toco or to palpation   Labs: Results for orders placed or performed during the hospital encounter of 03/13/20 (from  the past 24 hour(s))  Urinalysis, Routine w reflex microscopic Urine, Clean Catch     Status: Abnormal   Collection Time: 03/13/20  2:42 PM  Result Value Ref Range   Color, Urine YELLOW YELLOW   APPearance HAZY (A) CLEAR   Specific Gravity, Urine 1.026 1.005 - 1.030   pH 7.0 5.0 - 8.0   Glucose, UA >=500 (A) NEGATIVE mg/dL   Hgb urine dipstick NEGATIVE NEGATIVE   Bilirubin Urine NEGATIVE NEGATIVE   Ketones, ur NEGATIVE NEGATIVE mg/dL   Protein, ur 30 (A) NEGATIVE mg/dL   Nitrite NEGATIVE NEGATIVE   Leukocytes,Ua SMALL (A) NEGATIVE   RBC / HPF 0-5 0 - 5 RBC/hpf   WBC, UA 0-5 0 - 5 WBC/hpf   Bacteria, UA FEW (A) NONE SEEN   Squamous Epithelial / LPF 0-5 0 - 5   Mucus PRESENT    Ca Oxalate Crys, UA PRESENT   Fetal fibronectin     Status: None   Collection Time: 03/13/20  4:12 PM  Result Value Ref Range   Fetal Fibronectin NEGATIVE NEGATIVE   O/Positive/-- (03/16 1456)  Imaging:  No results found.  MAU Course/MDM: Orders Placed This Encounter  Procedures  . Urinalysis, Routine w reflex microscopic Urine, Clean Catch  . Fetal fibronectin  . Discharge patient    No orders of the defined types were placed in this encounter.    NST reviewed and reactive Cervix 1.5 cm and 50%effaced, FFN collected prior to exam and sent FFN is negative Cervix is c/w with previous vaginal delivery, no evidence of labor No pain or cramping in MAU DC home with labor precautions  Assessment: 1. Back pain affecting pregnancy in third trimester   2. Supervision of other normal pregnancy, antepartum   3. Threatened preterm labor, third trimester     Plan: Discharge home Labor precautions and fetal kick counts  Follow-up Information    [facility] Follow up.   Contact information: [address] [place] [address] [phone]             Allergies as of 03/13/2020   No Known Allergies     Medication List    TAKE these medications    Comfort Fit Maternity Supp Sm Misc Wear as directed.   ferrous sulfate 325 (65 FE) MG EC tablet Take 1 tablet (325 mg total) by mouth every other day.   prenatal multivitamin Tabs tablet Take 1 tablet by mouth daily at 12 noon.       Sharen Counter Certified Nurse-Midwife 03/13/2020 5:12 PM

## 2020-03-22 ENCOUNTER — Inpatient Hospital Stay (HOSPITAL_COMMUNITY)
Admission: AD | Admit: 2020-03-22 | Discharge: 2020-03-22 | Disposition: A | Discharge status: Home / Self Care | Payer: Medicaid Other | Attending: Obstetrics & Gynecology | Admitting: Obstetrics & Gynecology

## 2020-03-22 ENCOUNTER — Other Ambulatory Visit: Payer: Self-pay

## 2020-03-22 ENCOUNTER — Encounter (HOSPITAL_COMMUNITY): Payer: Self-pay | Admitting: Obstetrics & Gynecology

## 2020-03-22 DIAGNOSIS — Z3689 Encounter for other specified antenatal screening: Secondary | ICD-10-CM | POA: Diagnosis not present

## 2020-03-22 DIAGNOSIS — Z3A36 36 weeks gestation of pregnancy: Secondary | ICD-10-CM

## 2020-03-22 DIAGNOSIS — O368130 Decreased fetal movements, third trimester, not applicable or unspecified: Secondary | ICD-10-CM | POA: Diagnosis not present

## 2020-03-22 DIAGNOSIS — Z3480 Encounter for supervision of other normal pregnancy, unspecified trimester: Secondary | ICD-10-CM

## 2020-03-22 LAB — CBC
HCT: 32.7 % — ABNORMAL LOW (ref 36.0–46.0)
Hemoglobin: 10.3 g/dL — ABNORMAL LOW (ref 12.0–15.0)
MCH: 26.2 pg (ref 26.0–34.0)
MCHC: 31.5 g/dL (ref 30.0–36.0)
MCV: 83.2 fL (ref 80.0–100.0)
Platelets: 294 10*3/uL (ref 150–400)
RBC: 3.93 MIL/uL (ref 3.87–5.11)
RDW: 14 % (ref 11.5–15.5)
WBC: 7.5 10*3/uL (ref 4.0–10.5)
nRBC: 0 % (ref 0.0–0.2)

## 2020-03-22 LAB — COMPREHENSIVE METABOLIC PANEL
ALT: 9 U/L (ref 0–44)
AST: 14 U/L — ABNORMAL LOW (ref 15–41)
Albumin: 2.9 g/dL — ABNORMAL LOW (ref 3.5–5.0)
Alkaline Phosphatase: 154 U/L — ABNORMAL HIGH (ref 38–126)
Anion gap: 11 (ref 5–15)
BUN: <5 mg/dL — ABNORMAL LOW (ref 6–20)
CO2: 20 mmol/L — ABNORMAL LOW (ref 22–32)
Calcium: 8.9 mg/dL (ref 8.9–10.3)
Chloride: 105 mmol/L (ref 98–111)
Creatinine, Ser: 0.41 mg/dL — ABNORMAL LOW (ref 0.44–1.00)
GFR calc Af Amer: >60 mL/min (ref >60)
GFR calc non Af Amer: >60 mL/min (ref >60)
Glucose, Bld: 93 mg/dL (ref 70–99)
Potassium: 3.9 mmol/L (ref 3.5–5.1)
Sodium: 136 mmol/L (ref 135–145)
Total Bilirubin: 0.6 mg/dL (ref 0.3–1.2)
Total Protein: 6.2 g/dL — ABNORMAL LOW (ref 6.5–8.1)

## 2020-03-22 LAB — URINALYSIS, ROUTINE W REFLEX MICROSCOPIC
Bilirubin Urine: NEGATIVE
Glucose, UA: 150 mg/dL — AB
Hgb urine dipstick: NEGATIVE
Ketones, ur: NEGATIVE mg/dL
Nitrite: NEGATIVE
Protein, ur: 30 mg/dL — AB
Specific Gravity, Urine: 1.023 (ref 1.005–1.030)
pH: 6 (ref 5.0–8.0)

## 2020-03-22 LAB — PROTEIN / CREATININE RATIO, URINE
Creatinine, Urine: 156.6 mg/dL
Protein Creatinine Ratio: 0.16 mg/mg{Cre} — ABNORMAL HIGH (ref 0.00–0.15)
Total Protein, Urine: 25 mg/dL

## 2020-03-22 MED ORDER — ACETAMINOPHEN 500 MG PO TABS: 1000.0000 mg | ORAL_TABLET | Freq: Once | ORAL | Status: DC

## 2020-03-22 NOTE — MAU Note (Signed)
.  Julie [name] is a 24 y.o. at [redacted]w[redacted]d here in MAU reporting: DFM since 1100. PT reports she attempted to walk, drink water, and "different things" but has not felt much movement since. Reports mild cramping but denies ctx. Pt reports her blood pressure has been a little elevated today. Denies Vaginal bleeding or LOF.   Vitals:   03/22/20 2004  BP: (!) 129/92  Pulse: (!) 130  Resp: 17  Temp: 98.2 F (36.8 C)     FHT: monitors applied. FHR 150 Lab orders placed from triage: UA

## 2020-03-22 NOTE — Discharge Instructions (Signed)
Fetal Movement Counts Patient Name: ________________________________________________ Patient Due Date: ____________________ What is a fetal movement count?  A fetal movement count is the number of times that you feel your baby move during a certain amount of time. This may also be called a fetal kick count. A fetal movement count is recommended for every pregnant woman. You may be asked to start counting fetal movements as early as week 28 of your pregnancy. Pay attention to when your baby is most active. You may notice your baby's sleep and wake cycles. You may also notice things that make your baby move more. You should do a fetal movement count:  When your baby is normally most active.  At the same time each day. A good time to count movements is while you are resting, after having something to eat and drink. How do I count fetal movements? 1. Find a quiet, comfortable area. Sit, or lie down on your side. 2. Write down the date, the start time and stop time, and the number of movements that you felt between those two times. Take this information with you to your health care visits. 3. Write down your start time when you feel the first movement. 4. Count kicks, flutters, swishes, rolls, and jabs. You should feel at least 10 movements. 5. You may stop counting after you have felt 10 movements, or if you have been counting for 2 hours. Write down the stop time. 6. If you do not feel 10 movements in 2 hours, contact your health care provider for further instructions. Your health care provider may want to do additional tests to assess your baby's well-being. Contact a health care provider if:  You feel fewer than 10 movements in 2 hours.  Your baby is not moving like he or she usually does. Date: ____________ Start time: ____________ Stop time: ____________ Movements: ____________ Date: ____________ Start time: ____________ Stop time: ____________ Movements: ____________ Date: ____________  Start time: ____________ Stop time: ____________ Movements: ____________ Date: ____________ Start time: ____________ Stop time: ____________ Movements: ____________ Date: ____________ Start time: ____________ Stop time: ____________ Movements: ____________ Date: ____________ Start time: ____________ Stop time: ____________ Movements: ____________ Date: ____________ Start time: ____________ Stop time: ____________ Movements: ____________ Date: ____________ Start time: ____________ Stop time: ____________ Movements: ____________ Date: ____________ Start time: ____________ Stop time: ____________ Movements: ____________ This information is not intended to replace advice given to you by your health care provider. Make sure you discuss any questions you have with your health care provider. Document Revised: 02/07/2019 Document Reviewed: 02/07/2019 Elsevier Patient Education  2020 [employer]  

## 2020-03-22 NOTE — MAU Provider Note (Addendum)
History     CSN: [id number]  Arrival date and time: 03/22/20 1952   First Provider Initiated Contact with Patient 03/22/20 2018      Chief Complaint  Patient presents with  . Decreased Fetal Movement   HPI Julie [name] is a 24 y.o. G2P1001 at [redacted]w[redacted]d who presents with decreased fetal movement since 11am. She states she has tried eating and walking without improvement. She denies any leaking or bleeding. She also reports her blood pressures have been elevated today. She reports a dull headache that she rates a 2/10. She denies any visual changes or epigastric pain.   OB History    Gravida  2   Para  1   Term  1   Preterm      AB      Living  1     SAB      TAB      Ectopic      Multiple  0   Live Births  1           Past Medical History:  Diagnosis Date  . Anxiety   . Asthma   . Depression     Past Surgical History:  Procedure Laterality Date  . NO PAST SURGERIES      Family History  Problem Relation Age of Onset  . Bipolar disorder Father   . Alcohol abuse Paternal Grandmother   . Healthy Mother     Social History   Tobacco Use  . Smoking status: Never Smoker  . Smokeless tobacco: Never Used  Vaping Use  . Vaping Use: Never used  Substance Use Topics  . Alcohol use: No    Comment: hx of substance abuse.Denies all now  . Drug use: No    Comment: History of drug use/clean 3 months    Allergies: No Known Allergies  Medications Prior to Admission  Medication Sig Dispense Refill Last Dose  . ferrous sulfate 325 (65 FE) MG EC tablet Take 1 tablet (325 mg total) by mouth every other day. 30 tablet 2 03/21/2020 at Unknown time  . Prenatal Vit-Fe Fumarate-FA (PRENATAL MULTIVITAMIN) TABS tablet Take 1 tablet by mouth daily at 12 noon.   03/22/2020 at Unknown time  . Elastic Bandages & Supports (COMFORT FIT MATERNITY SUPP SM) MISC Wear as directed. 1 each 0 More than a month at Unknown time    Review of Systems  Constitutional: Negative.   Negative for fatigue and fever.  HENT: Negative.   Respiratory: Negative.  Negative for shortness of breath.   Cardiovascular: Negative.  Negative for chest pain.  Gastrointestinal: Negative.  Negative for abdominal pain, constipation, diarrhea, nausea and vomiting.  Genitourinary: Negative.  Negative for dysuria, vaginal bleeding and vaginal discharge.  Neurological: Positive for headaches. Negative for dizziness.   Physical Exam   Blood pressure 117/71, pulse (!) 101, temperature 98.2 F (36.8 C), temperature source Oral, resp. rate 17, height 5\' 5"  (1.651 m), weight 93.1 kg, last menstrual period 06/25/2019, SpO2 100 %, unknown if currently breastfeeding.  Patient Vitals for the past 24 hrs:  BP Temp Temp src Pulse Resp SpO2 Height Weight  03/22/20 2131 117/71 -- -- (!) 101 -- -- -- --  03/22/20 2116 113/71 -- -- (!) 108 -- -- -- --  03/22/20 2100 108/67 -- -- (!) 112 -- 100 % -- --  03/22/20 2046 118/72 -- -- (!) 102 -- -- -- --  03/22/20 2031 123/73 -- -- (!) 107 -- -- -- --  03/22/20 2021 (!) 143/83 -- -- (!) 110 -- -- -- --  03/22/20 2004 (!) 129/92 98.2 F (36.8 C) Oral (!) 130 17 -- 5\' 5"  (1.651 m) 93.1 kg     Physical Exam Vitals and nursing note reviewed.  Constitutional:      General: She is not in acute distress.    Appearance: She is well-developed.  HENT:     Head: Normocephalic.  Eyes:     Pupils: Pupils are equal, round, and reactive to light.  Cardiovascular:     Rate and Rhythm: Normal rate and regular rhythm.     Heart sounds: Normal heart sounds.  Pulmonary:     Effort: Pulmonary effort is normal. No respiratory distress.     Breath sounds: Normal breath sounds.  Abdominal:     General: Bowel sounds are normal. There is no distension.     Palpations: Abdomen is soft.     Tenderness: There is no abdominal tenderness.  Skin:    General: Skin is warm and dry.  Neurological:     Mental Status: She is alert and oriented to person, place, and time.      Motor: No abnormal muscle tone.     Coordination: Coordination normal.     Deep Tendon Reflexes: Reflexes are normal and symmetric. Reflexes normal.  Psychiatric:        Behavior: Behavior normal.        Thought Content: Thought content normal.        Judgment: Judgment normal.    Fetal Tracing:  Baseline: 150 Variability: moderate Accels: 15x15 Decels: none  Toco: ui  MAU Course  Procedures Results for orders placed or performed during the hospital encounter of 03/22/20 (from the past 24 hour(s))  CBC     Status: Abnormal   Collection Time: 03/22/20  8:09 PM  Result Value Ref Range   WBC 7.5 4.0 - 10.5 K/uL   RBC 3.93 3.87 - 5.11 MIL/uL   Hemoglobin 10.3 (L) 12.0 - 15.0 g/dL   HCT 03/24/20 (L) 36 - 46 %   MCV 83.2 80.0 - 100.0 fL   MCH 26.2 26.0 - 34.0 pg   MCHC 31.5 30.0 - 36.0 g/dL   RDW 25.8 52.7 - 78.2 %   Platelets 294 150 - 400 K/uL   nRBC 0.0 0.0 - 0.2 %  Comprehensive metabolic panel     Status: Abnormal   Collection Time: 03/22/20  8:09 PM  Result Value Ref Range   Sodium 136 135 - 145 mmol/L   Potassium 3.9 3.5 - 5.1 mmol/L   Chloride 105 98 - 111 mmol/L   CO2 20 (L) 22 - 32 mmol/L   Glucose, Bld 93 70 - 99 mg/dL   BUN <5 (L) 6 - 20 mg/dL   Creatinine, Ser 03/24/20 (L) 0.44 - 1.00 mg/dL   Calcium 8.9 8.9 - 5.36 mg/dL   Total Protein 6.2 (L) 6.5 - 8.1 g/dL   Albumin 2.9 (L) 3.5 - 5.0 g/dL   AST 14 (L) 15 - 41 U/L   ALT 9 0 - 44 U/L   Alkaline Phosphatase 154 (H) 38 - 126 U/L   Total Bilirubin 0.6 0.3 - 1.2 mg/dL   GFR calc non Af Amer >60 >60 mL/min   GFR calc Af Amer >60 >60 mL/min   Anion gap 11 5 - 15  Urinalysis, Routine w reflex microscopic Urine, Clean Catch     Status: Abnormal   Collection Time: 03/22/20  8:29 PM  Result Value Ref Range   Color, Urine AMBER (A) YELLOW   APPearance CLOUDY (A) CLEAR   Specific Gravity, Urine 1.023 1.005 - 1.030   pH 6.0 5.0 - 8.0   Glucose, UA 150 (A) NEGATIVE mg/dL   Hgb urine dipstick NEGATIVE NEGATIVE    Bilirubin Urine NEGATIVE NEGATIVE   Ketones, ur NEGATIVE NEGATIVE mg/dL   Protein, ur 30 (A) NEGATIVE mg/dL   Nitrite NEGATIVE NEGATIVE   Leukocytes,Ua LARGE (A) NEGATIVE   RBC / HPF 6-10 0 - 5 RBC/hpf   WBC, UA 21-50 0 - 5 WBC/hpf   Bacteria, UA MANY (A) NONE SEEN   Squamous Epithelial / LPF 11-20 0 - 5   Mucus PRESENT   Protein / creatinine ratio, urine     Status: Abnormal   Collection Time: 03/22/20  8:30 PM  Result Value Ref Range   Creatinine, Urine 156.60 mg/dL   Total Protein, Urine 25 mg/dL   Protein Creatinine Ratio 0.16 (H) 0.00 - 0.15 mg/mg[Cre]   MDM UA, UC CBC, CMP, Protein/Creat ratio Tylenol PO  Care turned over to K. Thamar Holik at 2100. Charlesetta Garibaldi [place], CNM 03/22/20   Patient alert and oriented times 3 in bed, declines further work-up. She feels strong fetal movements, her headache is a 2; she declines Tylenol while in MAU. She desires discharge.  Labs reviewed, PCR, CBC, CMP all normal   Assessment and Plan    1. NST (non-stress test) reactive   2. Supervision of other normal pregnancy, antepartum    2. Patient stable for discharge with plan to keep follow up appt on Tuesday. Patient is asymptomatic (small HA which she does not want treatment for-says she will take tylenol at home, no blurry vision, floating spots, RUQ pain or other complaints).   3. Urine culture pending  4. Reviewed strict return precautions, signs of pre-e, all questions answered.

## 2020-03-23 LAB — CULTURE, OB URINE

## 2020-03-24 ENCOUNTER — Other Ambulatory Visit: Payer: Self-pay

## 2020-03-24 ENCOUNTER — Ambulatory Visit (INDEPENDENT_AMBULATORY_CARE_PROVIDER_SITE_OTHER): Payer: Medicaid Other | Admitting: Obstetrics and Gynecology

## 2020-03-24 ENCOUNTER — Other Ambulatory Visit (HOSPITAL_COMMUNITY)
Admission: RE | Admit: 2020-03-24 | Discharge: 2020-03-24 | Disposition: A | Discharge status: Home / Self Care | Payer: Medicaid Other | Source: Ambulatory Visit | Attending: Obstetrics and Gynecology | Admitting: Obstetrics and Gynecology

## 2020-03-24 ENCOUNTER — Encounter: Payer: Self-pay | Admitting: Obstetrics and Gynecology

## 2020-03-24 VITALS — BP 120/76 | HR 98 | Wt 204.0 lb

## 2020-03-24 DIAGNOSIS — O99820 Streptococcus B carrier state complicating pregnancy: Secondary | ICD-10-CM

## 2020-03-24 DIAGNOSIS — Z3480 Encounter for supervision of other normal pregnancy, unspecified trimester: Secondary | ICD-10-CM | POA: Diagnosis not present

## 2020-03-24 NOTE — Progress Notes (Signed)
   PRENATAL VISIT NOTE  Subjective:  Julie [name] is a 24 y.o. G2P1001 at [redacted]w[redacted]d being seen today for ongoing prenatal care.  She is currently monitored for the following issues for this low-risk pregnancy and has Acne; Bipolar I disorder, moderate, current or most recent episode manic, in partial remission, with anxious distress (HCC); Generalized anxiety disorder; Obsessive compulsive disorder; and Supervision of other normal pregnancy, antepartum on their problem list.  Patient reports no complaints.  Contractions: Irritability. Vag. Bleeding: None.  Movement: Present. Denies leaking of fluid.   The following portions of the patient's history were reviewed and updated as appropriate: allergies, current medications, past family history, past medical history, past social history, past surgical history and problem list.   Objective:   Vitals:   03/24/20 1439  BP: 120/76  Pulse: 98  Weight: 204 lb (92.5 kg)    Fetal Status: Fetal Heart Rate (bpm): 160 Fundal Height: 36 cm Movement: Present  Presentation: Vertex  General:  Alert, oriented and cooperative. Patient is in no acute distress.  Skin: Skin is warm and dry. No rash noted.   Cardiovascular: Normal heart rate noted  Respiratory: Normal respiratory effort, no problems with respiration noted  Abdomen: Soft, gravid, appropriate for gestational age.  Pain/Pressure: Present     Pelvic: Cervical exam performed in the presence of a chaperone Dilation: 1.5 Effacement (%): 50 Station: -3  Extremities: Normal range of motion.  Edema: None  Mental Status: Normal mood and affect. Normal behavior. Normal judgment and thought content.   Assessment and Plan:  Pregnancy: G2P1001 at [redacted]w[redacted]d 1. Supervision of other normal pregnancy, antepartum Patient is doing well Cultures today  Preterm labor symptoms and general obstetric precautions including but not limited to vaginal bleeding, contractions, leaking of fluid and fetal movement were  reviewed in detail with the patient. Please refer to After Visit Summary for other counseling recommendations.   Return in about 1 week (around 03/31/2020) for in person, ROB, Low risk.  No future appointments.  Catalina Antigua, MD

## 2020-03-24 NOTE — Progress Notes (Signed)
ROB [redacted]w[redacted]d  GBS due today.  Recollection of urine noted on recent urine Cx.   CC: None

## 2020-03-25 ENCOUNTER — Encounter: Payer: Self-pay | Admitting: Obstetrics

## 2020-03-25 LAB — CERVICOVAGINAL ANCILLARY ONLY
Bacterial Vaginitis (gardnerella): NEGATIVE
Candida Glabrata: NEGATIVE
Candida Vaginitis: POSITIVE — AB
Chlamydia: NEGATIVE
Comment: NEGATIVE
Comment: NEGATIVE
Comment: NEGATIVE
Comment: NEGATIVE
Comment: NEGATIVE
Comment: NORMAL
Neisseria Gonorrhea: NEGATIVE
Trichomonas: NEGATIVE

## 2020-03-26 DIAGNOSIS — O99820 Streptococcus B carrier state complicating pregnancy: Secondary | ICD-10-CM | POA: Insufficient documentation

## 2020-03-26 LAB — STREP GP B NAA: Strep Gp B NAA: POSITIVE — AB

## 2020-03-26 MED ORDER — FLUCONAZOLE 150 MG PO TABS: 150.0000 mg | ORAL_TABLET | Freq: Once | ORAL | 0 refills | Status: AC

## 2020-03-26 NOTE — Addendum Note (Signed)
Addended by: Catalina Antigua on: 03/26/2020 04:08 PM   Modules accepted: Orders

## 2020-03-29 LAB — URINE CULTURE, OB REFLEX

## 2020-03-29 LAB — CULTURE, OB URINE

## 2020-04-01 ENCOUNTER — Other Ambulatory Visit: Payer: Self-pay

## 2020-04-01 ENCOUNTER — Ambulatory Visit (INDEPENDENT_AMBULATORY_CARE_PROVIDER_SITE_OTHER): Payer: Medicaid Other | Admitting: Obstetrics and Gynecology

## 2020-04-01 VITALS — BP 128/81 | HR 103 | Wt 206.0 lb

## 2020-04-01 DIAGNOSIS — Z3480 Encounter for supervision of other normal pregnancy, unspecified trimester: Secondary | ICD-10-CM

## 2020-04-01 NOTE — Progress Notes (Signed)
   PRENATAL VISIT NOTE  Subjective:  Julie [name] is a 24 y.o. G2P1001 at [redacted]w[redacted]d being seen today for ongoing prenatal care.  She is currently monitored for the following issues for this low-risk pregnancy and has Acne; Bipolar I disorder, moderate, current or most recent episode manic, in partial remission, with anxious distress (HCC); Generalized anxiety disorder; Obsessive compulsive disorder; Supervision of other normal pregnancy, antepartum; and GBS (group B Streptococcus carrier), +RV culture, currently pregnant on their problem list.  Patient reports backache.  Contractions: Not present. Vag. Bleeding: None, Bloody Show.  Movement: Present. Denies leaking of fluid.   The following portions of the patient's history were reviewed and updated as appropriate: allergies, current medications, past family history, past medical history, past social history, past surgical history and problem list.   Objective:   Vitals:   04/01/20 1545  BP: 128/81  Pulse: (!) 103  Weight: 206 lb (93.4 kg)    Fetal Status: Fetal Heart Rate (bpm): 159 Fundal Height: 37 cm Movement: Present  Presentation: Vertex  General:  Alert, oriented and cooperative. Patient is in no acute distress.  Skin: Skin is warm and dry. No rash noted.   Cardiovascular: Normal heart rate noted  Respiratory: Normal respiratory effort, no problems with respiration noted  Abdomen: Soft, gravid, appropriate for gestational age.  Pain/Pressure: Present     Pelvic: Cervical exam performed in the presence of a chaperone Dilation: 1.5 Effacement (%): 60 Station: -3  Extremities: Normal range of motion.  Edema: None  Mental Status: Normal mood and affect. Normal behavior. Normal judgment and thought content.   Assessment and Plan:  Pregnancy: G2P1001 at [redacted]w[redacted]d  1. Supervision of other normal pregnancy, antepartum  Doing well.  Favorable cervix and Multip. Would consider induction @ 40 weeks.    Term labor symptoms and general  obstetric precautions including but not limited to vaginal bleeding, contractions, leaking of fluid and fetal movement were reviewed in detail with the patient. Please refer to After Visit Summary for other counseling recommendations.   Return in about 1 week (around 04/08/2020) for In person visit. .  Future Appointments  Date Time Provider Department Center  04/08/2020  4:00 PM Naina Sleeper, Harolyn Rutherford, NP [facility] None    Venia Carbon, NP

## 2020-04-01 NOTE — Patient Instructions (Signed)
The [employer]  This circuit takes at least 90 minutes to complete so clear your schedule and make mental preparations so you can relax in your environment. The second step requires a lot of pillows so gather them up before beginning Before starting, you should empty your bladder! Have a nice drink nearby, and make sure it has a straw! If you are having contractions, this circuit should be done through contractions, try not to change positions between steps Before you begin...  "I named this 'circuit' after my friend Julie [name], who shared and discussed it with me when I was working with a client whose labor seemed to be stalled out and no longer progressing... This circuit is useful to help get the baby lined up, ideally, in the "Left Occiput Anterior" (LOA) Position, both before labor begins and when some corrections need to be done during labor. Prenatally, this position set can help to rotate a baby. As a natural method of induction, this can help get things going if baby just needed a gentle nudge of position to set things off. To the best of my knowledge, this group of positions will not "hurt" a baby that is already lined up correctly." - Julie [name]   Step One: Open-knee Chest Stay in this position for 30 minutes, start in cat/cow, then drop your chest as low as you can to the bed or the floor and your bottom as high as you can. Knees should be fairly wide apart, and the angle between the torso/thighs should be wider than 90 degrees. Wiggle around, prop with lots of pillows and use this time to get totally relaxed. This position allows the baby to scoot out of the pelvis a bit and gives them room to rotate, shift their head position, etc. If the pregnant person finds it helpful, careful positioning with a rebozo under the belly, with gentle tension from a support person behind can help maintain this position for the full 30 minutes.  Step Two:Exaggerated Left Side  Lying Roll to your left side, bringing your top leg as high as possible and keeping your bottom leg straight. Roll forward as much as possible, again using a lot of pillows. Sink into the bed and relax some more. If you fall asleep, that's totally okay and you can stay there! If not, stay here for at least another half an hour. Try and get your top right leg up towards your head and get as rolled over onto your belly as much as possible. If you repeat the circuit during labor, try alternating left and right sides. We know the photo the left is actually right side... just flip the image in your head.  Step Three: Moving and Lunges Lunge, walk stairs facing sideways, 2 at a time, (have a spotter downstairs of you!), take a walk outside with one foot on the curb and the other on the street, sit on a birth ball and hula- anything that's upright and putting your pelvis in open, asymmetrical positions. Spend at least 30 minutes doing this one as well to give your baby a chance to move down. If you are lunging or stair or curb walking, you should lunge/walk/go up stairs in the direction that feels better to you. The key with the lunge is that the toes of the higher leg and mom's belly button should be at right angles. Do not lunge over your knee, that closes the pelvis.     Julie [name]: Circuit Creator - [email or web address] Julie   [name], CD, BDT (DONA), LCCE, FACCE: Supporting Content - [email or web address] Rulon Eisenmenger: Photography - [email or web address] Luther Hearing CD/CDT [facility]): Print and Webmaster - [email or web address] [employer] [email or web address]     Cervical Ripening (to get your cervix ready for labor) : May try one or all:  Red Raspberry Leaf capsules:  two 300mg  or 400mg  tablets with each meal, 2-3 times a day  Potential Side Effects Of Raspberry Leaf:  Most women do not experience any side effects  from drinking raspberry leaf tea. However, nausea and loose stools are possible     Evening Primrose Oil capsules: may take 1 to 3 capsules daily. May also prick one to release the oil and insert it into your vagina at night.  Some of the potential side effects:  Upset stomach  Loose stools or diarrhea  Headaches  Nausea   4 Dates a day (may taste better if warmed in microwave until soft). Found where raisins are in the grocery store

## 2020-04-05 ENCOUNTER — Encounter (HOSPITAL_COMMUNITY): Payer: Self-pay | Admitting: Obstetrics & Gynecology

## 2020-04-05 ENCOUNTER — Inpatient Hospital Stay (HOSPITAL_COMMUNITY)
Admission: AD | Admit: 2020-04-05 | Discharge: 2020-04-05 | Disposition: A | Discharge status: Home / Self Care | Payer: Medicaid Other | Attending: Obstetrics & Gynecology | Admitting: Obstetrics & Gynecology

## 2020-04-05 ENCOUNTER — Other Ambulatory Visit: Payer: Self-pay

## 2020-04-05 DIAGNOSIS — Z3A39 39 weeks gestation of pregnancy: Secondary | ICD-10-CM | POA: Diagnosis not present

## 2020-04-05 DIAGNOSIS — O471 False labor at or after 37 completed weeks of gestation: Secondary | ICD-10-CM | POA: Diagnosis not present

## 2020-04-05 LAB — AMNISURE RUPTURE OF MEMBRANE (ROM) NOT AT ARMC: Amnisure ROM: NEGATIVE

## 2020-04-05 NOTE — Discharge Instructions (Signed)
Braxton Hicks Contractions °Contractions of the uterus can occur throughout pregnancy, but they are not always a sign that you are in labor. You may have practice contractions called Braxton Hicks contractions. These false labor contractions are sometimes confused with true labor. °What are Braxton Hicks contractions? °Braxton Hicks contractions are tightening movements that occur in the muscles of the uterus before labor. Unlike true labor contractions, these contractions do not result in opening (dilation) and thinning of the cervix. Toward the end of pregnancy (32-34 weeks), Braxton Hicks contractions can happen more often and may become stronger. These contractions are sometimes difficult to tell apart from true labor because they can be very uncomfortable. You should not feel embarrassed if you go to the hospital with false labor. °Sometimes, the only way to tell if you are in true labor is for your health care provider to look for changes in the cervix. The health care provider will do a physical exam and may monitor your contractions. If you are not in true labor, the exam should show that your cervix is not dilating and your water has not broken. °If there are no other health problems associated with your pregnancy, it is completely safe for you to be sent home with false labor. You may continue to have Braxton Hicks contractions until you go into true labor. °How to tell the difference between true labor and false labor °True labor °· Contractions last 30-70 seconds. °· Contractions become very regular. °· Discomfort is usually felt in the top of the uterus, and it spreads to the lower abdomen and low back. °· Contractions do not go away with walking. °· Contractions usually become more intense and increase in frequency. °· The cervix dilates and gets thinner. °False labor °· Contractions are usually shorter and not as strong as true labor contractions. °· Contractions are usually irregular. °· Contractions  are often felt in the front of the lower abdomen and in the groin. °· Contractions may go away when you walk around or change positions while lying down. °· Contractions get weaker and are shorter-lasting as time goes on. °· The cervix usually does not dilate or become thin. °Follow these instructions at home: ° °· Take over-the-counter and prescription medicines only as told by your health care provider. °· Keep up with your usual exercises and follow other instructions from your health care provider. °· Eat and drink lightly if you think you are going into labor. °· If Braxton Hicks contractions are making you uncomfortable: °? Change your position from lying down or resting to walking, or change from walking to resting. °? Sit and rest in a tub of warm water. °? Drink enough fluid to keep your urine pale yellow. Dehydration may cause these contractions. °? Do slow and deep breathing several times an hour. °· Keep all follow-up prenatal visits as told by your health care provider. This is important. °Contact a health care provider if: °· You have a fever. °· You have continuous pain in your abdomen. °Get help right away if: °· Your contractions become stronger, more regular, and closer together. °· You have fluid leaking or gushing from your vagina. °· You pass blood-tinged mucus (bloody show). °· You have bleeding from your vagina. °· You have low back pain that you never had before. °· You feel your baby’s head pushing down and causing pelvic pressure. °· Your baby is not moving inside you as much as it used to. °Summary °· Contractions that occur before labor are   called Braxton Hicks contractions, false labor, or practice contractions. °· Braxton Hicks contractions are usually shorter, weaker, farther apart, and less regular than true labor contractions. True labor contractions usually become progressively stronger and regular, and they become more frequent. °· Manage discomfort from Braxton Hicks contractions  by changing position, resting in a warm bath, drinking plenty of water, or practicing deep breathing. °This information is not intended to replace advice given to you by your health care provider. Make sure you discuss any questions you have with your health care provider. °Document Revised: 06/02/2017 Document Reviewed: 11/03/2016 °Elsevier Patient Education © 2020 [employer] ° °

## 2020-04-05 NOTE — MAU Note (Signed)
Julie [name] is a 24 y.o. at [redacted]w[redacted]d here in MAU reporting:  +LOF Clear with a little pink. States she is worried because this happened with her last pregnancy which resulted in an abruption. Onset of complaint: yesterday at 6pm  + lower abdominal cramping Pain score: 3/10  Vitals:   04/05/20 1538  BP: 117/78  Pulse: (!) 136  Resp: 19  Temp: 98.5 F (36.9 C)  SpO2: 99%     FHT:142; +FM Lab orders placed from triage: mau labor triage

## 2020-04-08 ENCOUNTER — Other Ambulatory Visit: Payer: Self-pay

## 2020-04-08 ENCOUNTER — Ambulatory Visit (INDEPENDENT_AMBULATORY_CARE_PROVIDER_SITE_OTHER): Payer: Medicaid Other | Admitting: Obstetrics and Gynecology

## 2020-04-08 VITALS — BP 125/82 | HR 90 | Wt 207.0 lb

## 2020-04-08 DIAGNOSIS — Z3480 Encounter for supervision of other normal pregnancy, unspecified trimester: Secondary | ICD-10-CM

## 2020-04-08 NOTE — Progress Notes (Signed)
   PRENATAL VISIT NOTE  Subjective:  Julie [name] is a 24 y.o. G2P1001 at [redacted]w[redacted]d being seen today for ongoing prenatal care.  She is currently monitored for the following issues for this low-risk pregnancy and has Acne; Bipolar I disorder, moderate, current or most recent episode manic, in partial remission, with anxious distress (HCC); Generalized anxiety disorder; Obsessive compulsive disorder; Supervision of other normal pregnancy, antepartum; and GBS (group B Streptococcus carrier), +RV culture, currently pregnant on their problem list.  Patient reports no complaints.  Contractions: Irritability. Vag. Bleeding: None.  Movement: Present. Denies leaking of fluid.   The following portions of the patient's history were reviewed and updated as appropriate: allergies, current medications, past family history, past medical history, past social history, past surgical history and problem list.   Objective:   Vitals:   04/08/20 1553  BP: 125/82  Pulse: 90  Weight: 207 lb (93.9 kg)    Fetal Status: Fetal Heart Rate (bpm): 140 Fundal Height: 38 cm Movement: Present     General:  Alert, oriented and cooperative. Patient is in no acute distress.  Skin: Skin is warm and dry. No rash noted.   Cardiovascular: Normal heart rate noted  Respiratory: Normal respiratory effort, no problems with respiration noted  Abdomen: Soft, gravid, appropriate for gestational age.  Pain/Pressure: Present     Pelvic: Cervical exam performed in the presence of a chaperone        Extremities: Normal range of motion.     Mental Status: Normal mood and affect. Normal behavior. Normal judgment and thought content.   Assessment and Plan:  Pregnancy: G2P1001 at [redacted]w[redacted]d  1. Supervision of other normal pregnancy, antepartum  Anxious about delivery; multip with favorable cervix. Will scheduled induction for 39/40 weeks.  Admission orders placed No new concerns Will return Thursday for foley bulb.   Term labor  symptoms and general obstetric precautions including but not limited to vaginal bleeding, contractions, leaking of fluid and fetal movement were reviewed in detail with the patient. Please refer to After Visit Summary for other counseling recommendations.   Return in about 8 days (around 04/16/2020), or in the afternoon for foley bulb insertion and NST.  Future Appointments  Date Time Provider Department Center  04/16/2020  3:00 PM Gerrit Heck, CNM [facility] None    Venia Carbon, NP

## 2020-04-08 NOTE — Patient Instructions (Signed)

## 2020-04-09 ENCOUNTER — Other Ambulatory Visit: Payer: Self-pay | Admitting: Advanced Practice Midwife

## 2020-04-09 ENCOUNTER — Telehealth (HOSPITAL_COMMUNITY): Payer: Self-pay | Admitting: *Deleted

## 2020-04-09 NOTE — Telephone Encounter (Signed)
Preadmission screen  

## 2020-04-10 ENCOUNTER — Encounter (HOSPITAL_COMMUNITY): Payer: Self-pay | Admitting: *Deleted

## 2020-04-10 ENCOUNTER — Telehealth (HOSPITAL_COMMUNITY): Payer: Self-pay | Admitting: *Deleted

## 2020-04-10 ENCOUNTER — Other Ambulatory Visit: Payer: Self-pay

## 2020-04-10 ENCOUNTER — Inpatient Hospital Stay (HOSPITAL_COMMUNITY)
Admission: AD | Admit: 2020-04-10 | Discharge: 2020-04-10 | Disposition: A | Discharge status: Home / Self Care | Payer: Medicaid Other | Attending: Obstetrics & Gynecology | Admitting: Obstetrics & Gynecology

## 2020-04-10 ENCOUNTER — Encounter (HOSPITAL_COMMUNITY): Payer: Self-pay | Admitting: Obstetrics & Gynecology

## 2020-04-10 DIAGNOSIS — O471 False labor at or after 37 completed weeks of gestation: Secondary | ICD-10-CM | POA: Diagnosis not present

## 2020-04-10 DIAGNOSIS — Z3A38 38 weeks gestation of pregnancy: Secondary | ICD-10-CM | POA: Insufficient documentation

## 2020-04-10 DIAGNOSIS — Z3480 Encounter for supervision of other normal pregnancy, unspecified trimester: Secondary | ICD-10-CM

## 2020-04-10 NOTE — MAU Provider Note (Addendum)
S: Ms. Julie [name] is a 24 y.o. G2P1001 at [redacted]w[redacted]d  who presents to MAU today for labor evaluation.     Cervical exam by RN:  Dilation: 3.5 Effacement (%): 80 Cervical Position: Middle Station: -2 Presentation: Vertex Exam by:: Erle Crocker, RN  Fetal Monitoring: Baseline: 130 Variability: moderate Accelerations: present Decelerations: absent Contractions: q2-5 mins  MDM Discussed patient with RN. NST reviewed.   A: SIUP at [redacted]w[redacted]d  Latent labor  P: Discharge home Active labor precautions and kick counts included in AVS Patient to follow-up with OBGYN as scheduled  Patient may return to MAU as needed  Glade Lloyd, MD PGY1 04/10/2020 3:46 AM

## 2020-04-10 NOTE — Telephone Encounter (Signed)
Preadmission screen  

## 2020-04-10 NOTE — MAU Provider Note (Signed)
S: Julie [name] is a 24 y.o. G2P1001 at [redacted]w[redacted]d  who presents to MAU today complaining of leaking of fluid since overnight. She endorses very light vaginal bleeding. She endorses contractions q35min. She reports normal fetal movement.    O: BP 128/84 (BP Location: Left Arm)   Pulse 89   Temp 99.1 F (37.3 C) (Oral)   Resp 17   LMP 06/25/2019   SpO2 100%  GENERAL: Well-developed, well-nourished female in no acute distress.  HEAD: Normocephalic, atraumatic.  CHEST: Normal effort of breathing, regular heart rate ABDOMEN: Soft, nontender, gravid PELVIC: Normal external female genitalia. Vagina is pink and rugated. Cervix with normal contour, no lesions. Normal discharge.  No pooling.   Cervical exam:  Dilation: 3.5 Effacement (%): 80 Cervical Position: Middle Station: -2 Presentation: Vertex Exam by:: Erle Crocker, RN  Fetal Monitoring: reactive Baseline: 160 Variability: moderate Accelerations: present Decelerations: none Contractions: mild q3-49min  Fern test and amnisure negative  A: SIUP at [redacted]w[redacted]d  Membranes intact  P: Discharge to home in stable condition with term labor precautions  Bernerd Limbo, CNM 04/10/2020 2:16 PM

## 2020-04-10 NOTE — MAU Note (Signed)
..  Julie [name] is a 24 y.o. at [redacted]w[redacted]d here in MAU reporting: CTX that began at 2100. Pt reports CTX are currently 3 mins apart. +FM. No VB or LOF.   Pain score: 6/10 - lower abdomen and back  FHT: 135

## 2020-04-10 NOTE — MAU Note (Signed)
I have communicated with Herby Abraham, MD and reviewed vital signs:  Vitals:   04/10/20 0150 04/10/20 0344  BP: 138/85 128/84  Pulse: 99 89  Resp: 19 17  Temp: 99.1 F (37.3 C)   SpO2: 99% 100%    Vaginal exam:  Dilation: 3.5 Effacement (%): 80 Cervical Position: Middle Station: -2 Presentation: Vertex Exam by:: Erle Crocker, RN,   Also reviewed contraction pattern and that non-stress test is reactive.  It has been documented that patient is contracting every 2.5-6 minutes with minimal cervical change over one and a half hours not indicating active labor.  Patient denies any other complaints and states "I think I would labor better at home anyways where I can relax and walk."  Based on this report provider has given order for discharge.  A discharge order and diagnosis entered by a provider.   Labor discharge instructions reviewed with patient.

## 2020-04-12 ENCOUNTER — Inpatient Hospital Stay (HOSPITAL_COMMUNITY): Payer: Medicaid Other | Admitting: Anesthesiology

## 2020-04-12 ENCOUNTER — Inpatient Hospital Stay (HOSPITAL_COMMUNITY)
Admission: AD | Admit: 2020-04-12 | Discharge: 2020-04-14 | DRG: 807 | Disposition: A | Discharge status: Home / Self Care | Payer: Medicaid Other | Attending: Obstetrics & Gynecology | Admitting: Obstetrics & Gynecology

## 2020-04-12 ENCOUNTER — Other Ambulatory Visit: Payer: Self-pay

## 2020-04-12 ENCOUNTER — Encounter (HOSPITAL_COMMUNITY): Payer: Self-pay | Admitting: Obstetrics and Gynecology

## 2020-04-12 DIAGNOSIS — Z20822 Contact with and (suspected) exposure to covid-19: Secondary | ICD-10-CM | POA: Diagnosis present

## 2020-04-12 DIAGNOSIS — Z23 Encounter for immunization: Secondary | ICD-10-CM | POA: Diagnosis not present

## 2020-04-12 DIAGNOSIS — Z3490 Encounter for supervision of normal pregnancy, unspecified, unspecified trimester: Secondary | ICD-10-CM | POA: Diagnosis present

## 2020-04-12 DIAGNOSIS — Z30017 Encounter for initial prescription of implantable subdermal contraceptive: Secondary | ICD-10-CM

## 2020-04-12 DIAGNOSIS — O26893 Other specified pregnancy related conditions, third trimester: Secondary | ICD-10-CM | POA: Diagnosis not present

## 2020-04-12 DIAGNOSIS — D649 Anemia, unspecified: Secondary | ICD-10-CM | POA: Diagnosis not present

## 2020-04-12 DIAGNOSIS — F429 Obsessive-compulsive disorder, unspecified: Secondary | ICD-10-CM | POA: Diagnosis present

## 2020-04-12 DIAGNOSIS — O99824 Streptococcus B carrier state complicating childbirth: Secondary | ICD-10-CM | POA: Diagnosis present

## 2020-04-12 DIAGNOSIS — Z3A39 39 weeks gestation of pregnancy: Secondary | ICD-10-CM | POA: Diagnosis not present

## 2020-04-12 DIAGNOSIS — O99820 Streptococcus B carrier state complicating pregnancy: Secondary | ICD-10-CM

## 2020-04-12 DIAGNOSIS — L709 Acne, unspecified: Secondary | ICD-10-CM | POA: Diagnosis present

## 2020-04-12 DIAGNOSIS — F3173 Bipolar disorder, in partial remission, most recent episode manic: Secondary | ICD-10-CM | POA: Diagnosis present

## 2020-04-12 DIAGNOSIS — O9902 Anemia complicating childbirth: Principal | ICD-10-CM | POA: Diagnosis present

## 2020-04-12 DIAGNOSIS — D509 Iron deficiency anemia, unspecified: Secondary | ICD-10-CM | POA: Diagnosis present

## 2020-04-12 DIAGNOSIS — Z3480 Encounter for supervision of other normal pregnancy, unspecified trimester: Secondary | ICD-10-CM

## 2020-04-12 DIAGNOSIS — F411 Generalized anxiety disorder: Secondary | ICD-10-CM | POA: Diagnosis present

## 2020-04-12 LAB — RESPIRATORY PANEL BY RT PCR (FLU A&B, COVID)
Influenza A by PCR: NEGATIVE
Influenza B by PCR: NEGATIVE
SARS Coronavirus 2 by RT PCR: NEGATIVE

## 2020-04-12 LAB — CBC
HCT: 32.3 % — ABNORMAL LOW (ref 36.0–46.0)
Hemoglobin: 9.9 g/dL — ABNORMAL LOW (ref 12.0–15.0)
MCH: 25.4 pg — ABNORMAL LOW (ref 26.0–34.0)
MCHC: 30.7 g/dL (ref 30.0–36.0)
MCV: 83 fL (ref 80.0–100.0)
Platelets: 276 10*3/uL (ref 150–400)
RBC: 3.89 MIL/uL (ref 3.87–5.11)
RDW: 14.5 % (ref 11.5–15.5)
WBC: 6.7 10*3/uL (ref 4.0–10.5)
nRBC: 0 % (ref 0.0–0.2)

## 2020-04-12 LAB — TYPE AND SCREEN
ABO/RH(D): O POS
Antibody Screen: NEGATIVE

## 2020-04-12 MED ORDER — OXYTOCIN-SODIUM CHLORIDE 30-0.9 UT/500ML-% IV SOLN
2.5000 [IU]/h | INTRAVENOUS | Status: DC
  Filled 2020-04-12: qty 500

## 2020-04-12 MED ORDER — PHENYLEPHRINE 40 MCG/ML (10ML) SYRINGE FOR IV PUSH (FOR BLOOD PRESSURE SUPPORT): 80.0000 ug | PREFILLED_SYRINGE | INTRAVENOUS | Status: DC | PRN

## 2020-04-12 MED ORDER — ONDANSETRON HCL 4 MG PO TABS: 4.0000 mg | ORAL_TABLET | ORAL | Status: DC | PRN

## 2020-04-12 MED ORDER — FENTANYL CITRATE (PF) 2500 MCG/50ML IJ SOLN
INTRAMUSCULAR | Status: DC | PRN
Start: 2020-04-12 — End: 2020-04-12
  Administered 2020-04-12: 12 mL/h via EPIDURAL

## 2020-04-12 MED ORDER — FENTANYL-BUPIVACAINE-NACL 0.5-0.125-0.9 MG/250ML-% EP SOLN
EPIDURAL | Status: AC
  Filled 2020-04-12: qty 250

## 2020-04-12 MED ORDER — FENTANYL CITRATE (PF) 100 MCG/2ML IJ SOLN
50.0000 ug | INTRAMUSCULAR | Status: DC | PRN
  Administered 2020-04-12: 100 ug via INTRAVENOUS
  Filled 2020-04-12: qty 2

## 2020-04-12 MED ORDER — PRENATAL MULTIVITAMIN CH
1.0000 | ORAL_TABLET | Freq: Every day | ORAL | Status: DC
  Administered 2020-04-13 – 2020-04-14 (×2): 1 via ORAL
  Filled 2020-04-12 (×2): qty 1

## 2020-04-12 MED ORDER — WITCH HAZEL-GLYCERIN EX PADS: 1.0000 "application " | MEDICATED_PAD | CUTANEOUS | Status: DC | PRN

## 2020-04-12 MED ORDER — BENZOCAINE-MENTHOL 20-0.5 % EX AERO
1.0000 "application " | INHALATION_SPRAY | CUTANEOUS | Status: DC | PRN
  Administered 2020-04-13: 1 via TOPICAL
  Filled 2020-04-12: qty 56

## 2020-04-12 MED ORDER — SODIUM CHLORIDE 0.9 % IV SOLN
2.0000 g | Freq: Once | INTRAVENOUS | Status: AC
  Administered 2020-04-12: 2 g via INTRAVENOUS
  Filled 2020-04-12: qty 2000

## 2020-04-12 MED ORDER — ONDANSETRON HCL 4 MG/2ML IJ SOLN: 4.0000 mg | Freq: Four times a day (QID) | INTRAMUSCULAR | Status: DC | PRN

## 2020-04-12 MED ORDER — OXYTOCIN BOLUS FROM INFUSION: 333.0000 mL | Freq: Once | INTRAVENOUS | Status: DC

## 2020-04-12 MED ORDER — IBUPROFEN 600 MG PO TABS
600.0000 mg | ORAL_TABLET | Freq: Four times a day (QID) | ORAL | Status: DC
  Administered 2020-04-13 – 2020-04-14 (×7): 600 mg via ORAL
  Filled 2020-04-12 (×7): qty 1

## 2020-04-12 MED ORDER — DIPHENHYDRAMINE HCL 50 MG/ML IJ SOLN: 12.5000 mg | INTRAMUSCULAR | Status: DC | PRN

## 2020-04-12 MED ORDER — SENNOSIDES-DOCUSATE SODIUM 8.6-50 MG PO TABS
2.0000 | ORAL_TABLET | ORAL | Status: DC
  Administered 2020-04-13: 2 via ORAL
  Filled 2020-04-12: qty 2

## 2020-04-12 MED ORDER — DIBUCAINE (PERIANAL) 1 % EX OINT: 1.0000 "application " | TOPICAL_OINTMENT | CUTANEOUS | Status: DC | PRN

## 2020-04-12 MED ORDER — EPHEDRINE 5 MG/ML INJ: 10.0000 mg | INTRAVENOUS | Status: DC | PRN

## 2020-04-12 MED ORDER — OXYTOCIN-SODIUM CHLORIDE 30-0.9 UT/500ML-% IV SOLN: 2.5000 [IU]/h | INTRAVENOUS | Status: DC

## 2020-04-12 MED ORDER — ONDANSETRON HCL 4 MG/2ML IJ SOLN: 4.0000 mg | INTRAMUSCULAR | Status: DC | PRN

## 2020-04-12 MED ORDER — ACETAMINOPHEN 325 MG PO TABS
650.0000 mg | ORAL_TABLET | Freq: Four times a day (QID) | ORAL | Status: DC
  Administered 2020-04-13 – 2020-04-14 (×7): 650 mg via ORAL
  Filled 2020-04-12 (×7): qty 2

## 2020-04-12 MED ORDER — LACTATED RINGERS IV SOLN: 500.0000 mL | Freq: Once | INTRAVENOUS | Status: DC

## 2020-04-12 MED ORDER — SOD CITRATE-CITRIC ACID 500-334 MG/5ML PO SOLN: 30.0000 mL | ORAL | Status: DC | PRN

## 2020-04-12 MED ORDER — LACTATED RINGERS IV SOLN: 500.0000 mL | INTRAVENOUS | Status: DC | PRN

## 2020-04-12 MED ORDER — FENTANYL-BUPIVACAINE-NACL 0.5-0.125-0.9 MG/250ML-% EP SOLN: 12.0000 mL/h | EPIDURAL | Status: DC | PRN

## 2020-04-12 MED ORDER — LACTATED RINGERS IV SOLN
500.0000 mL | INTRAVENOUS | Status: DC | PRN
  Administered 2020-04-12: 1000 mL via INTRAVENOUS

## 2020-04-12 MED ORDER — ACETAMINOPHEN 325 MG PO TABS: 650.0000 mg | ORAL_TABLET | ORAL | Status: DC | PRN

## 2020-04-12 MED ORDER — SODIUM CHLORIDE 0.9 % IV SOLN
1.0000 g | INTRAVENOUS | Status: DC
  Filled 2020-04-12 (×8): qty 1000

## 2020-04-12 MED ORDER — OXYCODONE-ACETAMINOPHEN 5-325 MG PO TABS: 1.0000 | ORAL_TABLET | ORAL | Status: DC | PRN

## 2020-04-12 MED ORDER — DIPHENHYDRAMINE HCL 25 MG PO CAPS: 25.0000 mg | ORAL_CAPSULE | Freq: Four times a day (QID) | ORAL | Status: DC | PRN

## 2020-04-12 MED ORDER — OXYTOCIN BOLUS FROM INFUSION
333.0000 mL | Freq: Once | INTRAVENOUS | Status: AC
  Administered 2020-04-12: 333 mL via INTRAVENOUS

## 2020-04-12 MED ORDER — LACTATED RINGERS IV SOLN: INTRAVENOUS | Status: DC

## 2020-04-12 MED ORDER — TETANUS-DIPHTH-ACELL PERTUSSIS 5-2.5-18.5 LF-MCG/0.5 IM SUSP: 0.5000 mL | Freq: Once | INTRAMUSCULAR | Status: DC

## 2020-04-12 MED ORDER — LIDOCAINE HCL (PF) 1 % IJ SOLN: 30.0000 mL | INTRAMUSCULAR | Status: DC | PRN

## 2020-04-12 MED ORDER — COCONUT OIL OIL: 1.0000 "application " | TOPICAL_OIL | Status: DC | PRN

## 2020-04-12 MED ORDER — LIDOCAINE-EPINEPHRINE (PF) 2 %-1:200000 IJ SOLN
INTRAMUSCULAR | Status: DC | PRN
  Administered 2020-04-12 (×2): 3 mL via EPIDURAL

## 2020-04-12 MED ORDER — LIDOCAINE HCL (PF) 1 % IJ SOLN
30.0000 mL | INTRAMUSCULAR | Status: AC | PRN
  Administered 2020-04-12: 30 mL via SUBCUTANEOUS
  Filled 2020-04-12: qty 30

## 2020-04-12 MED ORDER — OXYCODONE-ACETAMINOPHEN 5-325 MG PO TABS: 2.0000 | ORAL_TABLET | ORAL | Status: DC | PRN

## 2020-04-12 MED ORDER — SIMETHICONE 80 MG PO CHEW: 80.0000 mg | CHEWABLE_TABLET | ORAL | Status: DC | PRN

## 2020-04-12 NOTE — Discharge Instructions (Signed)

## 2020-04-12 NOTE — MAU Note (Signed)
Julie [name] is a 24 y.o. at [redacted]w[redacted]d here in MAU reporting: contractions since this AM and they have gotten closer and have been 4 minutes apart for the past 2 hours. No bleeding. No LOF. +FM  Onset of complaint: today  Pain score: 7/10  Vitals:   04/12/20 1601  BP: 127/78  Pulse: (!) 109  Resp: 18  Temp: 98.6 F (37 C)  SpO2: 99%     FHT: +FM  Lab orders placed from triage: none

## 2020-04-12 NOTE — MAU Note (Addendum)
Pt is G2P1 at 39 weeks with complaints of contractions started this am and were irregular- they have increased in intensity and have been regular over the last 2 hours. Pt denies SROM, vaginal bleeding or bloody show. Pt endorses + fetal movement.

## 2020-04-12 NOTE — H&P (Signed)
OBSTETRIC ADMISSION HISTORY AND PHYSICAL  Julie [name] is a 24 y.o. female G2P1001 with IUP at [redacted]w[redacted]d by 11 wk u/s presenting for labor. She reports +FMs, No LOF, no VB, no blurry vision, headaches or peripheral edema, and RUQ pain.  She plans on breast feeding. She request nexplanon for birth control. She received her [facility]   Dating: By 11 wk u/s --->  Estimated Date of Delivery: 04/19/20  Sono:    11/29/19@[redacted]w[redacted]d , CWD, normal anatomy, cephalic presentation, 325g, 62% EFW   Prenatal History/Complications:  Bipolar 1/GAD/OCD GBS positive  Past Medical History: Past Medical History:  Diagnosis Date  . Anxiety   . Asthma   . Depression     Past Surgical History: Past Surgical History:  Procedure Laterality Date  . NO PAST SURGERIES      Obstetrical History: OB History    Gravida  2   Para  1   Term  1   Preterm      AB      Living  1     SAB      TAB      Ectopic      Multiple  0   Live Births  1           Social History Social History   Socioeconomic History  . Marital status: Single    Spouse name: Not on file  . Number of children: Not on file  . Years of education: Not on file  . Highest education level: Not on file  Occupational History  . 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 Smoking status: Never Smoker  . Smokeless tobacco: Never Used  Vaping Use  . Vaping Use: Never used  Substance and Sexual Activity  . Alcohol use: No    Comment: hx of substance abuse.Denies all now  . Drug use: No    Comment: History of drug use/clean 3 months  . Sexual activity: Yes    Partners: Male    Birth control/protection: None  Other Topics Concern  . Not on file  Social History Narrative  . Not on file   Social Determinants of Health   Financial Resource Strain:   . Difficulty of Paying Living Expenses: Not on file  Food Insecurity:   . Worried About  Programme researcher, broadcasting/film/video in the Last Year: Not on file  . Ran Out of Food in the Last Year: Not on file  Transportation Needs:   . Lack of Transportation (Medical): Not on file  . Lack of Transportation (Non-Medical): Not on file  Physical Activity:   . Days of Exercise per Week: Not on file  . Minutes of Exercise per Session: Not on file  Stress:   . Feeling of Stress : Not on file  Social Connections:   . Frequency of Communication with Friends and Family: Not on file  . Frequency of Social Gatherings with Friends and Family: Not on file  . Attends Religious Services: Not on file  . Active Member of Clubs or Organizations: Not on file  . Attends Banker Meetings: Not on file  . Marital Status: Not on file    Family History: Family History  Problem Relation Age of Onset  . Bipolar disorder Father   . Alcohol abuse Paternal Grandmother   . Healthy Mother     Allergies: No Known Allergies  Medications Prior  to Admission  Medication Sig Dispense Refill Last Dose  . Elastic Bandages & Supports (COMFORT FIT MATERNITY SUPP SM) MISC Wear as directed. 1 each 0   . ferrous sulfate 325 (65 FE) MG EC tablet Take 1 tablet (325 mg total) by mouth every other day. 30 tablet 2   . Prenatal Vit-Fe Fumarate-FA (PRENATAL MULTIVITAMIN) TABS tablet Take 1 tablet by mouth daily at 12 noon.        Review of Systems   All systems reviewed and negative except as stated in HPI  Blood pressure 139/76, pulse (!) 107, temperature 98.4 F (36.9 C), temperature source Oral, resp. rate 16, height 5\' 5"  (1.651 m), weight 94.5 kg, last menstrual period 06/25/2019, SpO2 99 %, unknown if currently breastfeeding. General appearance: alert, cooperative and no distress Lungs: normal respiratory effort Heart: regular rate and rhythm Abdomen: soft, non-tender; gravid Pelvic: as noted below Extremities: Homans sign is negative, no sign of DVT Presentation: cephalic per MAU exam Fetal  monitoringBaseline: 130 bpm, Variability: Good {> 6 bpm), Accelerations: Reactive and Decelerations: Absent Uterine activityFrequency: Every 1-3 minutes Dilation: 5 Effacement (%): 100 Station: -1 Exam by:: k fields, rn   Prenatal labs: ABO, Rh: --/--/PENDING (10/10 1627) Antibody: PENDING (10/10 1627) Rubella: 2.08 (03/16 1456) RPR: Non Reactive (07/20 1011)  HBsAg: Negative (03/16 1456)  HIV: Non Reactive (07/20 1011)  GBS: Positive/-- (09/21 0301)  2 hr Glucola  Genetic screening passed Anatomy 10-11-1984 normal  Prenatal Transfer Tool  Maternal Diabetes: No Genetic Screening: Normal Maternal Ultrasounds/Referrals: Normal Fetal Ultrasounds or other Referrals:  None Maternal Substance Abuse:  No Significant Maternal Medications:  None Significant Maternal Lab Results: Group B Strep positive  Results for orders placed or performed during the hospital encounter of 04/12/20 (from the past 24 hour(s))  Respiratory Panel by RT PCR (Flu A&B, Covid) - Nasopharyngeal Swab   Collection Time: 04/12/20  4:25 PM   Specimen: Nasopharyngeal Swab  Result Value Ref Range   SARS Coronavirus 2 by RT PCR NEGATIVE NEGATIVE   Influenza A by PCR NEGATIVE NEGATIVE   Influenza B by PCR NEGATIVE NEGATIVE  CBC   Collection Time: 04/12/20  4:27 PM  Result Value Ref Range   WBC 6.7 4.0 - 10.5 K/uL   RBC 3.89 3.87 - 5.11 MIL/uL   Hemoglobin 9.9 (L) 12.0 - 15.0 g/dL   HCT 06/12/20 (L) 36 - 46 %   MCV 83.0 80.0 - 100.0 fL   MCH 25.4 (L) 26.0 - 34.0 pg   MCHC 30.7 30.0 - 36.0 g/dL   RDW 40.9 81.1 - 91.4 %   Platelets 276 150 - 400 K/uL   nRBC 0.0 0.0 - 0.2 %  Type and screen [facility]   Collection Time: 04/12/20  4:27 PM  Result Value Ref Range   ABO/RH(D) PENDING    Antibody Screen PENDING    Sample Expiration      04/15/2020,2359 Performed at [facility], [address], [place], [place]     Patient Active Problem List   Diagnosis Date Noted  . Pregnancy  04/12/2020  . Encounter for induction of labor 04/12/2020  . GBS (group B Streptococcus carrier), +RV culture, currently pregnant 03/26/2020  . Supervision of other normal pregnancy, antepartum 09/06/2019  . Generalized anxiety disorder 05/16/2018  . Obsessive compulsive disorder 05/16/2018  . Bipolar I disorder, moderate, current or most recent episode manic, in partial remission, with anxious distress (HCC) 09/10/2013  . Acne 07/26/2011  Assessment/Plan:  Arley Lavena Loretto is a 24 y.o. G2P1001 at [redacted]w[redacted]d here for SOL.  #Labor: Patient presented to MAU in active labor. Expectant management. #Pain: PRN #FWB: Cat 1 #ID: GBS pos, ampicillin #MOF: breast #MOC: nexplanon #Circ: no #GAD/bipolar 1/OCD: SW consult postpartum  Alric Seton, MD  04/12/2020, 6:02 PM

## 2020-04-12 NOTE — Anesthesia Preprocedure Evaluation (Signed)
Anesthesia Evaluation  Patient identified by MRN, date of birth, ID band Patient awake    Reviewed: Allergy & Precautions, NPO status , Patient's Chart, lab work & pertinent test results  Airway Mallampati: II  TM Distance: >3 FB Neck ROM: Full    Dental no notable dental hx.    Pulmonary asthma ,    Pulmonary exam normal breath sounds clear to auscultation       Cardiovascular negative cardio ROS Normal cardiovascular exam Rhythm:Regular Rate:Normal     Neuro/Psych PSYCHIATRIC DISORDERS Anxiety Depression Bipolar Disorder negative neurological ROS     GI/Hepatic negative GI ROS, Neg liver ROS,   Endo/Other  negative endocrine ROS  Renal/GU negative Renal ROS  negative genitourinary   Musculoskeletal negative musculoskeletal ROS (+)   Abdominal   Peds  Hematology  (+) Blood dyscrasia (Hgb 9.9), anemia ,   Anesthesia Other Findings   Reproductive/Obstetrics (+) Pregnancy                             Anesthesia Physical Anesthesia Plan  ASA: II  Anesthesia Plan: Epidural   Post-op Pain Management:    Induction:   PONV Risk Score and Plan: Treatment may vary due to age or medical condition  Airway Management Planned: Natural Airway  Additional Equipment:   Intra-op Plan:   Post-operative Plan:   Informed Consent: I have reviewed the patients History and Physical, chart, labs and discussed the procedure including the risks, benefits and alternatives for the proposed anesthesia with the patient or authorized representative who has indicated his/her understanding and acceptance.       Plan Discussed with: Anesthesiologist  Anesthesia Plan Comments: (Patient identified. Risks, benefits, options discussed with patient including but not limited to bleeding, infection, nerve damage, paralysis, failed block, incomplete pain control, headache, blood pressure changes, nausea,  vomiting, reactions to medication, itching, and post partum back pain. Confirmed with bedside nurse the patient's most recent platelet count. Confirmed with the patient that they are not taking any anticoagulation, have any bleeding history or any family history of bleeding disorders. Patient expressed understanding and wishes to proceed. All questions were answered. )        Anesthesia Quick Evaluation

## 2020-04-12 NOTE — Anesthesia Procedure Notes (Signed)
Epidural Patient location during procedure: OB Start time: 04/12/2020 5:30 PM End time: 04/12/2020 5:40 PM  Staffing Anesthesiologist: Elmer Picker, MD Performed: anesthesiologist   Preanesthetic Checklist Completed: patient identified, IV checked, risks and benefits discussed, monitors and equipment checked, pre-op evaluation and timeout performed  Epidural Patient position: sitting Prep: DuraPrep and site prepped and draped Patient monitoring: continuous pulse ox, blood pressure, heart rate and cardiac monitor Approach: midline Location: L3-L4 Injection technique: LOR air  Needle:  Needle type: Tuohy  Needle gauge: 17 G Needle length: 9 cm Needle insertion depth: 5.5 cm Catheter type: closed end flexible Catheter size: 19 Gauge Catheter at skin depth: 12 cm Test dose: negative  Assessment Sensory level: T8 Events: blood not aspirated, injection not painful, no injection resistance, no paresthesia and negative IV test  Additional Notes Patient identified. Risks/Benefits/Options discussed with patient including but not limited to bleeding, infection, nerve damage, paralysis, failed block, incomplete pain control, headache, blood pressure changes, nausea, vomiting, reactions to medication both or allergic, itching and postpartum back pain. Confirmed with bedside nurse the patient's most recent platelet count. Confirmed with patient that they are not currently taking any anticoagulation, have any bleeding history or any family history of bleeding disorders. Patient expressed understanding and wished to proceed. All questions were answered. Sterile technique was used throughout the entire procedure. Please see nursing notes for vital signs. Test dose was given through epidural catheter and negative prior to continuing to dose epidural or start infusion. Warning signs of high block given to the patient including shortness of breath, tingling/numbness in hands, complete motor  block, or any concerning symptoms with instructions to call for help. Patient was given instructions on fall risk and not to get out of bed. All questions and concerns addressed with instructions to call with any issues or inadequate analgesia.  Reason for block:procedure for pain

## 2020-04-12 NOTE — Discharge Summary (Addendum)
Postpartum Discharge Summary    Patient Name: Julie [name] DOB: Jun 16, 1996 MRN: [id number]  Date of admission: 04/12/2020 Delivery date:04/12/2020  Delivering provider: Ezequiel Essex  Date of discharge: 04/14/2020  Admitting diagnosis: Pregnancy [Z34.90] Encounter for induction of labor [Z34.90] Intrauterine pregnancy: [redacted]w[redacted]d    Secondary diagnosis:  Principal Problem:   Vaginal delivery Active Problems:   Acne   Bipolar I disorder, moderate, current or most recent episode manic, in partial remission, with anxious distress ([place]   Generalized anxiety disorder   Obsessive compulsive disorder   Supervision of other normal pregnancy, antepartum   GBS (group B Streptococcus carrier), +RV culture, currently pregnant   Pregnancy   Encounter for induction of labor   Encounter for initial prescription of implantable subdermal contraceptive  Additional problems: as noted above   Discharge diagnosis: Vaginal delivery                                            Post partum procedures: PP Nexplanon Augmentation:  none Complications: None  Hospital course: Onset of Labor With Vaginal Delivery      24y.o. yo [id number] admitted in Latent Labor on 04/12/2020. Patient had an uncomplicated labor course as follows:  Membrane Rupture Time/Date: 7:59 PM ,04/12/2020   Delivery Method:Vaginal, Spontaneous  Episiotomy: None  Lacerations:  2nd degree  Patient had an uncomplicated postpartum course. SW was consulted and cleared pt for discharge. She is ambulating, tolerating a regular diet, passing flatus, and urinating well. Patient is discharged home in stable condition on 04/14/20.  Newborn Data: Birth date:04/12/2020  Birth time:8:30 PM  Gender:Female  Living status:Living  Apgars:7 ,9  Weight:7 lb 11 oz (3.487 kg)   Magnesium Sulfate received: No BMZ received: No Rhophylac:N/A MMR:N/A T-DaP:Given prenatally Flu: No Transfusion:No  Physical exam  Vitals:    04/13/20 0900 04/13/20 1315 04/13/20 2246 04/14/20 0551  BP: 120/77 116/75 108/70 126/81  Pulse: 86 93 77 79  Resp: _0 Temp: 98.3 F (36.8 C) 99.2 F (37.3 C) 98.2 F (36.8 C) 98.3 F (36.8 C)  TempSrc:  Axillary Oral Oral  SpO2: 100% 99% 100% 100%  Weight:      Height:       General: alert, cooperative and no distress Lochia: appropriate Uterine Fundus: firm Incision: N/A DVT Evaluation: No evidence of DVT seen on physical exam. No cords or calf tenderness. No significant calf/ankle edema. Labs: Lab Results  Component Value Date   WBC 6.7 04/12/2020   HGB 9.9 (L) 04/12/2020   HCT 32.3 (L) 04/12/2020   MCV 83.0 04/12/2020   PLT 276 04/12/2020   CMP Latest Ref Rng & Units 03/22/2020  Glucose 70 - 99 mg/dL 93  BUN 6 - 20 mg/dL <5(L)  Creatinine 0.44 - 1.00 mg/dL 0.41(L)  Sodium 135 - 145 mmol/L 136  Potassium 3.5 - 5.1 mmol/L 3.9  Chloride 98 - 111 mmol/L 105  CO2 22 - 32 mmol/L 20(L)  Calcium 8.9 - 10.3 mg/dL 8.9  Total Protein 6.5 - 8.1 g/dL 6.2(L)  Total Bilirubin 0.3 - 1.2 mg/dL 0.6  Alkaline Phos 38 - 126 U/L 154(H)  AST 15 - 41 U/L 14(L)  ALT 0 - 44 U/L 9   Edinburgh Score: Edinburgh Postnatal Depression Scale Screening Tool 04/13/2020  I have been able to laugh and see the funny side of things.  0  I have looked forward with enjoyment to things. 0  I have blamed myself unnecessarily when things went wrong. 1  I have been anxious or worried for no good reason. 1  I have felt scared or panicky for no good reason. 1  Things have been getting on top of me. 0  I have been so unhappy that I have had difficulty sleeping. 0  I have felt sad or miserable. 1  I have been so unhappy that I have been crying. 1  The thought of harming myself has occurred to me. 0  Edinburgh Postnatal Depression Scale Total 5     After visit meds:  Allergies as of 04/14/2020   No Known Allergies      Medication List     STOP taking these medications    [place] these medications    acetaminophen 325 MG tablet Commonly known as: Tylenol Take 2 tablets (650 mg total) by mouth every 6 (six) hours as needed for mild pain, moderate pain, fever or headache.   coconut oil Oil Apply 1 application topically as needed (nipple pain).   ferrous sulfate 325 (65 FE) MG EC tablet Take 1 tablet (325 mg total) by mouth every other day. What changed: when to take this   ibuprofen 600 MG tablet Commonly known as: ADVIL Take 1 tablet (600 mg total) by mouth every 8 (eight) hours as needed.   NEXPLANON [place] Inject 1 Device into the skin once. Inserted in left arm on 10.11.2021   prenatal multivitamin Tabs tablet Take 1 tablet by mouth daily at 12 noon.       Discharge home in stable condition Infant Feeding: N/A Infant Disposition:home with mother Discharge instruction: per After Visit Summary and Postpartum booklet. Activity: Advance as tolerated. Pelvic rest for 6 weeks.  Diet: routine diet Future Appointments: Future Appointments  Date Time Provider [place]  04/20/2020 10:00 AM Lynnea Ferrier, LCSW [facility] None  05/11/2020 11:15 AM Leftwich-Kirby, Kathie Dike, CNM [facility] None   Follow up Visit:  Message sent to [facility] clinic to schedule pp appt and mood check:  Please schedule this patient for a In person postpartum visit in 4 weeks with the following provider: Any provider. Additional Postpartum F/U: 1 week mood check Low risk pregnancy complicated by:  Bipolar disorder, GAD, OCD (no meds) Delivery mode:  Vaginal, Spontaneous  Anticipated Birth Control:  PP Nexplanon  04/14/2020 Randa Ngo, MD

## 2020-04-13 ENCOUNTER — Encounter (HOSPITAL_COMMUNITY): Payer: Self-pay | Admitting: Obstetrics and Gynecology

## 2020-04-13 DIAGNOSIS — Z30017 Encounter for initial prescription of implantable subdermal contraceptive: Secondary | ICD-10-CM | POA: Diagnosis not present

## 2020-04-13 HISTORY — PX: INSERTION OF IMPLANON ROD: OBO 1005

## 2020-04-13 LAB — RPR: RPR Ser Ql: NONREACTIVE

## 2020-04-13 MED ORDER — FERROUS SULFATE 325 (65 FE) MG PO TABS: 325.0000 mg | ORAL_TABLET | ORAL | Status: DC

## 2020-04-13 MED ORDER — INFLUENZA VAC SPLIT QUAD 0.5 ML IM SUSY
0.5000 mL | PREFILLED_SYRINGE | INTRAMUSCULAR | Status: AC
  Administered 2020-04-14: 0.5 mL via INTRAMUSCULAR
  Filled 2020-04-13: qty 0.5

## 2020-04-13 MED ORDER — ETONOGESTREL 68 MG ~~LOC~~ IMPL
68.0000 mg | DRUG_IMPLANT | Freq: Once | SUBCUTANEOUS | Status: AC
  Administered 2020-04-13: 68 mg via SUBCUTANEOUS
  Filled 2020-04-13: qty 1

## 2020-04-13 MED ORDER — LIDOCAINE HCL 1 % IJ SOLN
10.0000 mL | Freq: Once | INTRAMUSCULAR | Status: AC | PRN
  Administered 2020-04-13: 10 mL via INTRADERMAL
  Filled 2020-04-13: qty 20

## 2020-04-13 NOTE — Clinical Social Work Maternal (Signed)
CLINICAL SOCIAL WORK MATERNAL/CHILD NOTE  Patient Details  Name: Julie [name] MRN: [id number] Date of Birth: January 01, 1996  Date:  04/13/2020  Clinical Social Worker Initiating Note:  Julie [name], [place] Date/Time: Initiated:  04/13/20/1040     Child's Name:  Julie [name]   Biological Parents:  Mother, Father   Need for Interpreter:  None   Reason for Referral:  Behavioral Health Concerns   Address:  [place] [address]    Phone number:  [phone] (home)     Additional phone number:   Household Members/Support Persons (HM/SP):   Household Member/Support Person 1, Household Member/Support Person 2   HM/SP Name Relationship DOB or Age  HM/SP -1 Julie [name] Significant Other 04/16/1996  HM/SP -2 Julie [name] Daughter 09/26/2018  HM/SP -3        HM/SP -4        HM/SP -5        HM/SP -6        HM/SP -7        HM/SP -8          Natural Supports (not living in the home):  Immediate Family   Professional Supports: None   Employment: Unemployed   Type of Work:     Education:  [place] arranged:    Museum/gallery curator Resources:  [employer]   Other Resources:  Theatre stage manager Considerations Which May Impact Care:    Strengths:  Ability to meet basic needs , Engineer, materials, Home prepared for child    Psychotropic Medications:         Pediatrician:    [place]olic[place] area  Pediatrician List:   [place]      Pediatrician Fax Number:    Risk Factors/Current Problems:  Mental Health Concerns    Cognitive State:  Alert , Insightful    Mood/Affect:  Interested , Calm , Happy    CSW Assessment: CSW consulted for hx of bipolar, anxiety and ocd. CSW met with MOB to complete assessment and provide support. CSW introduced self and role. CSW informed MOB of reason for consult, MOB  expressed understanding. MOB stated her mental health diagnosis all stems from high school. MOB expressed she has not experienced any mental health symptoms since high school. MOB stated she had a good pregnancy and she did not experience any anxiety during the pregnancy. MOB stated she has not been on any medications for mental health in more than a year and she last attended therapy in high school. MOB denied any SI, HI or being involved in DV. MOB expressed her parents, grandmother and FOB family are supportive. MOB stated she is currently feeling good.   MOB resides with FOB and daughter. MOB receives food stamps and was informed she can contact [facility] if interested. MOB stated they have all of the essential needs for baby to discharge home, including a brand new carseat. MOB stated baby will receive follow-up care at [facility] and denies any transportation barriers.  CSW provided education regarding the baby blues period vs. perinatal mood disorders, discussed treatment and gave resources for mental health follow up if concerns arise.  CSW recommends self-evaluation during the postpartum time period using the New Mom Checklist from Postpartum Progress and encouraged MOB to contact a medical professional if symptoms are noted at any time.  MOB denied experiencing PPD with her last child.  CSW provided review of Sudden Infant Death Syndrome (SIDS) precautions. MOB stated baby will sleep in bedside basinet once discharged home.   CSW identifies no further need for intervention and no barriers to discharge at this time.   CSW Plan/Description:  No Further Intervention Required/No Barriers to Discharge, Sudden Infant Death Syndrome (SIDS) Education, Perinatal Mood and Anxiety Disorder (PMADs) Education    Julie [name], LCSWA 04/13/2020, 10:56 AM

## 2020-04-13 NOTE — Lactation Note (Signed)
This note was copied from a baby's chart. Lactation Consultation Note LC attempted to see mom but mom was sleeping as well as everyone in room. Dim lit rm. Spoke w/RN mom pumped and bottle fed 1st child.  Patient Name: Julie [name] Date: 04/13/2020     Maternal Data    Feeding    LATCH Score                   Interventions    Lactation Tools Discussed/Used     Consult Status      Charyl Dancer 04/13/2020, 2:31 AM

## 2020-04-13 NOTE — Procedures (Addendum)
Post-Placental Nexplanon Insertion Procedure Note  Patient was identified. Informed consent was signed, signed copy in chart. A time-out was performed.   The insertion site was identified 8-10 cm (3-4 inches) from the medial epicondyle of the humerus and 3-5 cm (1.25-2 inches) posterior to (below) the sulcus (groove) between the biceps and triceps muscles of the patient's left arm and marked. The site was prepped and draped in the usual sterile fashion. Pt was prepped with alcohol swab and then injected with 8 cc of 1% lidocaine. The site was prepped with betadine. Nexplanon removed form packaging,  Device confirmed in needle, then inserted full length of needle and withdrawn per handbook instructions. Provider and patient verified presence of the implant in the woman's arm by palpation. Pt insertion site was covered with adhesive bandage and pressure bandage. There was minimal blood loss. Patient tolerated procedure well.  Patient was given post procedure instructions and Nexplanon user card with expiration date. Condoms were recommended for STI prevention. Patient was asked to keep the pressure dressing on for 24 hours to minimize bruising and keep the adhesive bandage on for 3-5 days. The patient verbalized understanding of the plan of care and agrees.   Lot # [id number] Expiration Date Mar/28/2024   Herby Abraham MD  I saw and evaluated the patient. I agree with the findings and the plan of care as documented in the resident's note.  Casper Harrison, MD [facility] Family Medicine Fellow, [facility] for [facility], [facility]

## 2020-04-13 NOTE — Progress Notes (Addendum)
POSTPARTUM PROGRESS NOTE  Subjective: Julie [name] is a 24 y.o. [id number] s/p SVD at [redacted]w[redacted]d.  She reports she doing well. No acute events overnight. She denies any problems with ambulating, voiding or po intake. Denies nausea or vomiting. She has passed flatus. Pain is well controlled.  Lochia is minimal.  Objective: Blood pressure 111/70, pulse 84, temperature 98.2 F (36.8 C), resp. rate 18, height 5\' 5"  (1.651 m), weight 94.5 kg, last menstrual period 06/25/2019, SpO2 99 %, unknown if currently breastfeeding.  Physical Exam:  General: alert, cooperative and no distress Chest: no respiratory distress Abdomen: soft, non-tender  Uterine Fundus: firm and at level of umbilicus Extremities: No calf swelling or tenderness  no edema  Recent Labs    04/12/20 1627  HGB 9.9*  HCT 32.3*    Assessment/Plan: Julie [name] is a 24 y.o. 30 s/p s/p SVD at [redacted]w[redacted]d. PPD#1.   Routine Postpartum Care: Doing well, pain well-controlled.  -- Continue routine care, lactation support  -- Contraception: nexplanon inserted today  -- Feeding: breast   --Iron deficiency anemia: Start PO iron q every other day   Dispo: Plan for discharge tomorrow 10/12.  12/12, MD 04/13/2020 9:10 AM   I saw and evaluated the patient. I agree with the findings and the plan of care as documented in the resident's note.  06/13/2020, MD [facility] Family Medicine Fellow, [facility] for [facility], [facility]

## 2020-04-13 NOTE — Lactation Note (Signed)
This note was copied from a baby's chart. Lactation Consultation Note  Patient Name: Julie [name] TIRWE'R Date: 04/13/2020 Reason for consult: Initial assessment;Term  Baby is 60 hours old / experienced breast feeder  - attempted  Latching for 2 weeks / DL due to tongue - tie and pumped for 6 months.  As LC entered the room baby STS on moms chest asleep/  Per mom the baby last fed at 1:20 p for 10 mins with a Nipple Shield  The nurse gave me and colostrum in the nipple Shield.  Per mom I've been able to hand express.  LC set up the DEBP with instructions for mom and recommended after the Baby feeds to post pump both breast for 15 -20 mins/ save milk for the next feeding and save to instill EBM into the NS for an appetizer.  LC encouraged mom to call for a feeding assessment, and oral assessment.  Per mom mentioned she has been told she has flat nipples and that is why the NS started. LC provided shells to wear after pumping except when sleeping.  LC provided the [facility] pamphlet with resources phone numbers.      Maternal Data Has patient been taught Hand Expression?: Yes Does the patient have breastfeeding experience prior to this delivery?: Yes  Feeding Feeding Type:  (per mom baby ate at 1: 30)  LATCH Score                   Interventions Interventions: Breast feeding basics reviewed  Lactation Tools Discussed/Used Tools: Pump;Nipple Dorris Carnes (RN started the #24 NS) Nipple shield size: 24 Breast pump type: Double-Electric Breast Pump WIC Program: No (per mom) Pump Review: Milk Storage;Setup, frequency, and cleaning Initiated by:: MAI Date initiated:: 04/13/20   Consult Status Consult Status: Follow-up Date: 04/13/20 Follow-up type: In-patient    Matilde Sprang Dunbar Buras 04/13/2020, 2:53 PM

## 2020-04-13 NOTE — Anesthesia Postprocedure Evaluation (Signed)
Anesthesia Post Note  Patient: Julie [name]  Procedure(s) Performed: AN AD HOC LABOR EPIDURAL     Patient location during evaluation: Mother Baby Anesthesia Type: Epidural Level of consciousness: awake and alert Pain management: pain level controlled Vital Signs Assessment: post-procedure vital signs reviewed and stable Respiratory status: spontaneous breathing, nonlabored ventilation and respiratory function stable Cardiovascular status: stable Postop Assessment: no headache, no backache and epidural receding Anesthetic complications: no   No complications documented.  Last Vitals:  Vitals:   04/13/20 0033 04/13/20 0420  BP:  111/70  Pulse:  84  Resp:  18  Temp: 37.1 C 36.8 C  SpO2:      Last Pain:  Vitals:   04/12/20 2310  TempSrc: Axillary  PainSc: 0-No pain   Pain Goal: Patients Stated Pain Goal: 2 (04/12/20 1633)                 Fanny Dance

## 2020-04-14 ENCOUNTER — Ambulatory Visit: Payer: Medicaid Other

## 2020-04-14 MED ORDER — COCONUT OIL OIL: 1.0000 "application " | TOPICAL_OIL | 0 refills | Status: DC | PRN

## 2020-04-14 MED ORDER — ACETAMINOPHEN 325 MG PO TABS: 650.0000 mg | ORAL_TABLET | Freq: Four times a day (QID) | ORAL | Status: DC | PRN

## 2020-04-14 MED ORDER — IBUPROFEN 600 MG PO TABS
600.0000 mg | ORAL_TABLET | Freq: Three times a day (TID) | ORAL | 0 refills | Status: DC | PRN
Start: 2020-04-14 — End: 2020-06-03

## 2020-04-14 NOTE — Lactation Note (Signed)
This note was copied from a baby's chart. Lactation Consultation Note  Patient Name: Julie [name] OACZY'S Date: 04/14/2020 Reason for consult: Follow-up assessment   P1, Baby 36 hours old and sleeping by mother's side.  Recently breastfed x2 for 10 min with #24NS. Mother states instead of regularly pumping she is hand expressing approx 5 ml and giving back to baby. Provided mother with another #24NS and discussed cleaning. Discussed trying to breastfeed without NS by taking off half way through feeding at least once per day allowing baby to start getting used to mother's tissue. Mother has DEBP at home. Recommend mother call if she would like assistance with feeding today. Mother is aware of milk storage and taking pump parts home.     Maternal Data Has patient been taught Hand Expression?: Yes  Feeding Feeding Type: Breast Fed  LATCH Score                   Interventions Interventions: Breast feeding basics reviewed  Lactation Tools Discussed/Used Tools: Pump;Nipple Shields Nipple shield size: 24   Consult Status Consult Status: Follow-up Date: 04/15/20 Follow-up type: In-patient    Dahlia Byes [facility] 04/14/2020, 9:17 AM

## 2020-04-15 ENCOUNTER — Telehealth: Payer: Self-pay

## 2020-04-15 ENCOUNTER — Ambulatory Visit: Payer: Self-pay

## 2020-04-15 ENCOUNTER — Other Ambulatory Visit (HOSPITAL_COMMUNITY): Payer: Medicaid Other

## 2020-04-15 NOTE — Lactation Note (Signed)
This note was copied from a baby's chart. Lactation Consultation Note  Patient Name: Julie [name] FWYOV'Z Date: 04/15/2020   [facility] visit attempted. Mom in bathroom. Infant noted to have only lost 40 g overnight (with 2 documented voids & 2 stools during that time period between being weighed).   LC to return.  Lurline Hare [facility] 04/15/2020, 8:14 AM

## 2020-04-15 NOTE — Telephone Encounter (Addendum)
Transition Care Management Unsuccessful Follow-up Telephone Call  Date of discharge and from where:  04/14/2020 from [facility]  Attempts:  1st Attempt  Reason for unsuccessful TCM follow-up call:  Left voice message

## 2020-04-15 NOTE — Lactation Note (Signed)
This note was copied from a baby's chart. Lactation Consultation Note  Patient Name: Julie [name] HAFBX'U Date: 04/15/2020   Infant is 21 hrs old. Mom had been using a size 24 NS, but infant latched with ease using the teacup hold (which I explained to Dad how to do for the next latch). Infant had frequent swallows (often with a 1:1 suck:swallow ratio). Mom was comfortable w/latch. Parents are able to identify the sound of swallows.   Mom reports an "oversupply" with her last child (Mom could pump 42 oz/day, while infant only needed 32 oz/day). Mom understands that if she continues not to need a nipple shield, then she only needs to pump prn.   Parents were counseled that if Mom does use the NS at home, then she should pump a few times/day and f/u with lactation on an outpatient basis. Cleaning/saniziting instructions of NS were discussed.   Mom has a Lansinoh pump at home; parents have no questions.   I anticipate infant will soon be gaining weight. Stools are transitioning.    Lurline Hare [facility] 04/15/2020, 8:57 AM

## 2020-04-16 ENCOUNTER — Encounter (HOSPITAL_COMMUNITY): Payer: Medicaid Other

## 2020-04-16 NOTE — Telephone Encounter (Signed)
Transition Care Management Follow-up Telephone Call  Date of discharge and from where: 04/14/2020 from [facility]  How have you been since you were released from the hospital? Patient states that she is fine and doing well.   Any questions or concerns? No  Items Reviewed:  Did the pt receive and understand the discharge instructions provided? Yes   Medications obtained and verified? Yes   Other? No   Any new allergies since your discharge? No   Dietary orders reviewed? N/A  Do you have support at home? Yes    Functional Questionnaire: (I = Independent and D = Dependent) ADLs: I  Bathing/Dressing- I  Meal Prep- I  Eating- I  Maintaining continence- I  Transferring/Ambulation- I  Managing Meds- I  Follow up appointments reviewed:   [facility] f/u appt confirmed? Yes  Scheduled to see Gwyndolyn Saxon, LCSW on 04/20/2020 @ 10:00am.  Are transportation arrangements needed? No   If their condition worsens, is the pt aware to call PCP or go to the Emergency Dept.? Yes  Was the patient provided with contact information for the PCP's office or ED? Yes  Was to pt encouraged to call back with questions or concerns? Yes

## 2020-04-17 ENCOUNTER — Inpatient Hospital Stay (HOSPITAL_COMMUNITY): Payer: Medicaid Other

## 2020-04-17 ENCOUNTER — Inpatient Hospital Stay (HOSPITAL_COMMUNITY)
Admission: AD | Admit: 2020-04-17 | Payer: Medicaid Other | Source: Home / Self Care | Admitting: Obstetrics & Gynecology

## 2020-04-20 ENCOUNTER — Encounter: Payer: Medicaid Other | Admitting: Licensed Clinical Social Worker

## 2020-04-20 ENCOUNTER — Telehealth: Payer: Self-pay | Admitting: Licensed Clinical Social Worker

## 2020-04-20 NOTE — Telephone Encounter (Signed)
Left message for callback regarding scheduled mychart appt

## 2020-04-22 ENCOUNTER — Encounter: Payer: Self-pay | Admitting: Psychiatry

## 2020-05-11 ENCOUNTER — Other Ambulatory Visit: Payer: Self-pay

## 2020-05-11 ENCOUNTER — Ambulatory Visit (INDEPENDENT_AMBULATORY_CARE_PROVIDER_SITE_OTHER): Payer: Medicaid Other | Admitting: Advanced Practice Midwife

## 2020-05-11 DIAGNOSIS — Z975 Presence of (intrauterine) contraceptive device: Secondary | ICD-10-CM | POA: Insufficient documentation

## 2020-05-11 NOTE — Progress Notes (Signed)
..   Post Partum Visit Note  Julie [name] is a 24 y.o. G76P2002 female who presents for a postpartum visit. She is 4 weeks postpartum following a normal spontaneous vaginal delivery.  I have fully reviewed the prenatal and intrapartum course. The delivery was at 39.0 gestational weeks.  Anesthesia: epidural. Postpartum course has been good. Baby is doing well. Baby is feeding by breast. Bleeding staining only. Bowel function is normal. Bladder function is normal. Patient is not sexually active. Contraception method is Nexplanon. Postpartum depression screening: negative.   The pregnancy intention screening data noted above was reviewed. Potential methods of contraception were discussed. The patient elected to proceed with Hormonal Implant.    Edinburgh Postnatal Depression Scale - 05/11/20 1130      Edinburgh Postnatal Depression Scale:  In the Past 7 Days   I have been able to laugh and see the funny side of things. 0    I have looked forward with enjoyment to things. 0    I have blamed myself unnecessarily when things went wrong. 0    I have been anxious or worried for no good reason. 0    I have felt scared or panicky for no good reason. 0    Things have been getting on top of me. 0    I have been so unhappy that I have had difficulty sleeping. 0    I have felt sad or miserable. 0    I have been so unhappy that I have been crying. 0    The thought of harming myself has occurred to me. 0    Edinburgh Postnatal Depression Scale Total 0            The following portions of the patient's history were reviewed and updated as appropriate: allergies, current medications, past family history, past medical history, past social history, past surgical history and problem list.  Review of Systems Pertinent items noted in HPI and remainder of comprehensive ROS otherwise negative.    Objective:  Blood pressure 131/80, pulse 81, height 5\' 5"  (1.651 m), weight 190 lb 6.4 oz (86.4 kg),  last menstrual period 06/25/2019, currently breastfeeding.  VS reviewed, nursing note reviewed,  Constitutional: well developed, well nourished, no distress HEENT: normocephalic CV: normal rate Pulm/chest wall: normal effort Abdomen: soft Neuro: alert and oriented x 3 Skin: warm, dry Psych: affect normal  Assessment:   Normal postpartum exam. Pap smear not done at today's visit.   Plan:   Essential components of care per ACOG recommendations:  1.  Mood and well being: Patient with negative depression screening today. Reviewed local resources for support.  - Patient does not use tobacco.  - hx of drug use? No    2. Infant care and feeding:  -Patient currently breastmilk feeding? Yes  If breastmilk feeding discussed return to work and pumping. If needed, patient was provided letter for work to allow for every 2-3 hr pumping breaks, and to be granted a private location to express breastmilk and refrigerated area to store breastmilk. Reviewed importance of draining breast regularly to support lactation. -Social determinants of health (SDOH) reviewed in EPIC. No concerns  3. Sexuality, contraception and birth spacing - Patient does not want a pregnancy in the next year.   - Reviewed forms of contraception in tiered fashion. Patient desired Nexplanon today.  Placed during hospital stay. - Discussed birth spacing of 18 months  4. Sleep and fatigue -Encouraged family/partner/community support of 4 hrs of uninterrupted sleep to  help with mood and fatigue  5. Physical Recovery  - Discussed patients delivery - Patient had a second degree laceration, perineal healing reviewed. Patient expressed understanding - Patient has urinary incontinence? No  - Patient is safe to resume physical and sexual activity  6.  Health Maintenance - Last pap smear done 10/30/18 at [facility]   Sharen Counter, CNM Center for [employer], [facility]

## 2020-06-03 ENCOUNTER — Other Ambulatory Visit: Payer: Self-pay

## 2020-06-03 ENCOUNTER — Ambulatory Visit: Payer: Medicaid Other | Admitting: Obstetrics

## 2020-06-03 ENCOUNTER — Ambulatory Visit (INDEPENDENT_AMBULATORY_CARE_PROVIDER_SITE_OTHER): Payer: Medicaid Other | Admitting: Obstetrics

## 2020-06-03 DIAGNOSIS — N946 Dysmenorrhea, unspecified: Secondary | ICD-10-CM

## 2020-06-03 DIAGNOSIS — Z975 Presence of (intrauterine) contraceptive device: Secondary | ICD-10-CM | POA: Diagnosis not present

## 2020-06-03 DIAGNOSIS — N939 Abnormal uterine and vaginal bleeding, unspecified: Secondary | ICD-10-CM | POA: Diagnosis not present

## 2020-06-03 MED ORDER — IBUPROFEN 800 MG PO TABS: 800.0000 mg | ORAL_TABLET | Freq: Three times a day (TID) | ORAL | 5 refills | Status: DC | PRN

## 2020-06-03 MED ORDER — ORTHO-NOVUM 1/35 (28) 1-35 MG-MCG PO TABS: 1.0000 | ORAL_TABLET | Freq: Every day | ORAL | 0 refills | Status: DC

## 2020-06-03 NOTE — Progress Notes (Signed)
Patient ID: Julie [name], female   DOB: Jan 23, 1996, 23 y.o.   MRN: [id number]  Chief Complaint  Patient presents with  . Gynecologic Exam  . Vaginal Bleeding    HPI Julie [name] is a 24 y.o. female.  Irregular vaginal bleeding since Nexplanon insertion 8 weeks ago. HPI  Past Medical History:  Diagnosis Date  . Anxiety   . Asthma   . Depression     Past Surgical History:  Procedure Laterality Date  . INSERTION OF IMPLANON ROD  04/13/2020      . NO PAST SURGERIES      Family History  Problem Relation Age of Onset  . Bipolar disorder Father   . Alcohol abuse Paternal Grandmother   . Healthy Mother     Social History Social History   Tobacco Use  . Smoking status: Never Smoker  . Smokeless tobacco: Never Used  Vaping Use  . Vaping Use: Never used  Substance Use Topics  . Alcohol use: No    Comment: hx of substance abuse.Denies all now  . Drug use: No    Comment: History of drug use/clean 3 months    No Known Allergies  Current Outpatient Medications  Medication Sig Dispense Refill  . acetaminophen (TYLENOL) 325 MG tablet Take 2 tablets (650 mg total) by mouth every 6 (six) hours as needed for mild pain, moderate pain, fever or headache. (Patient not taking: Reported on 05/11/2020)    . coconut oil OIL Apply 1 application topically as needed (nipple pain). (Patient not taking: Reported on 05/11/2020)  0  . Etonogestrel (NEXPLANON [place]) Inject 1 Device into the skin once. Inserted in left arm on 10.11.2021 (Patient not taking: Reported on 05/11/2020)    . ferrous sulfate 325 (65 FE) MG EC tablet Take 1 tablet (325 mg total) by mouth every other day. (Patient not taking: Reported on 05/11/2020) 30 tablet 2  . ibuprofen (ADVIL) 800 MG tablet Take 1 tablet (800 mg total) by mouth every 8 (eight) hours as needed. 30 tablet 5  . norethindrone-ethinyl estradiol 1/35 (ORTHO-NOVUM 1/35, 28,) tablet Take 1 tablet by mouth daily. 28 tablet 0  . Prenatal Vit-Fe Fumarate-FA  (PRENATAL MULTIVITAMIN) TABS tablet Take 1 tablet by mouth daily at 12 noon.     No current facility-administered medications for this visit.    Review of Systems Review of Systems Constitutional: negative for fatigue and weight loss Respiratory: negative for cough and wheezing Cardiovascular: negative for chest pain, fatigue and palpitations Gastrointestinal: negative for abdominal pain and change in bowel habits Genitourinary: positive for irregular vaginal bleeding Integument/breast: negative for nipple discharge Musculoskeletal:negative for myalgias Neurological: negative for gait problems and tremors Behavioral/Psych: negative for abusive relationship, depression Endocrine: negative for temperature intolerance      Blood pressure 121/79, pulse 75, height 5\' 5"  (1.651 m), weight 189 lb (85.7 kg), last menstrual period 06/25/2019, currently breastfeeding.  Physical Exam Physical Exam           General: Alert and no distress Abdomen:  normal findings: no organomegaly, soft, non-tender and no hernia  Pelvis:  External genitalia: normal general appearance Urinary system: urethral meatus normal and bladder without fullness, nontender Vaginal: normal without tenderness, induration or masses Cervix: normal appearance Adnexa: normal bimanual exam Uterus: anteverted and non-tender, normal size    50% of 15 min visit spent on counseling and coordination of care.   Data Reviewed Labs  Assessment     1. Postpartum care following vaginal delivery  2. Nexplanon  in place  3. Abnormal uterine bleeding (AUB) Rx: - norethindrone-ethinyl estradiol 1/35 (ORTHO-NOVUM 1/35, 28,) tablet; Take 1 tablet by mouth daily.  Dispense: 28 tablet; Refill: 0  4. Dysmenorrhea Rx: - ibuprofen (ADVIL) 800 MG tablet; Take 1 tablet (800 mg total) by mouth every 8 (eight) hours as needed.  Dispense: 30 tablet; Refill: 5    Plan   Follow up in 6 weeks  Meds ordered this encounter  Medications  .  norethindrone-ethinyl estradiol 1/35 (ORTHO-NOVUM 1/35, 28,) tablet    Sig: Take 1 tablet by mouth daily.    Dispense:  28 tablet    Refill:  0  . ibuprofen (ADVIL) 800 MG tablet    Sig: Take 1 tablet (800 mg total) by mouth every 8 (eight) hours as needed.    Dispense:  30 tablet    Refill:  5     Brock Bad, MD 06/03/2020 4:53 PM

## 2020-06-03 NOTE — Progress Notes (Signed)
GYN presents for heavy bleeding x 8 weeks, changes a pad every hour, cramps 7/10 x 4 days, NV, headaches 4-5/10

## 2020-06-04 ENCOUNTER — Ambulatory Visit (HOSPITAL_COMMUNITY): Payer: Self-pay

## 2020-06-19 DIAGNOSIS — F3171 Bipolar disorder, in partial remission, most recent episode hypomanic: Secondary | ICD-10-CM | POA: Diagnosis not present

## 2020-06-19 DIAGNOSIS — F603 Borderline personality disorder: Secondary | ICD-10-CM | POA: Diagnosis not present

## 2020-06-19 DIAGNOSIS — F4312 Post-traumatic stress disorder, chronic: Secondary | ICD-10-CM | POA: Diagnosis not present

## 2020-06-19 DIAGNOSIS — F53 Postpartum depression: Secondary | ICD-10-CM | POA: Diagnosis not present

## 2020-07-15 ENCOUNTER — Encounter: Payer: Self-pay | Admitting: Obstetrics

## 2020-07-15 ENCOUNTER — Telehealth (INDEPENDENT_AMBULATORY_CARE_PROVIDER_SITE_OTHER): Payer: Medicaid Other | Admitting: Obstetrics

## 2020-07-15 ENCOUNTER — Other Ambulatory Visit: Payer: Self-pay

## 2020-07-15 DIAGNOSIS — F53 Postpartum depression: Secondary | ICD-10-CM | POA: Diagnosis not present

## 2020-07-15 DIAGNOSIS — F603 Borderline personality disorder: Secondary | ICD-10-CM | POA: Diagnosis not present

## 2020-07-15 DIAGNOSIS — F4312 Post-traumatic stress disorder, chronic: Secondary | ICD-10-CM | POA: Diagnosis not present

## 2020-07-15 DIAGNOSIS — F3171 Bipolar disorder, in partial remission, most recent episode hypomanic: Secondary | ICD-10-CM | POA: Diagnosis not present

## 2020-07-15 DIAGNOSIS — N939 Abnormal uterine and vaginal bleeding, unspecified: Secondary | ICD-10-CM

## 2020-07-15 NOTE — Progress Notes (Signed)
Virtual GYN F/U   Pt was given birth control to stop irregular bleeding  Pt notes no longer having any bleeding.

## 2020-07-15 NOTE — Progress Notes (Signed)
Patient did not answer phone call, several attempts.  Brock Bad, MD 07/15/2020 12:50 PM

## 2020-08-17 ENCOUNTER — Telehealth: Payer: Self-pay | Admitting: Advanced Practice Midwife

## 2020-08-18 NOTE — Telephone Encounter (Signed)
error 

## 2020-09-01 IMAGING — US US OB COMP LESS 14 WK
1 series · 15 of 28 positions shown · non-contrast
Comparison: None for this gestation

CLINICAL DATA: First trimester pregnancy, unknown LMP, uncertain
dating, nausea, vomiting

EXAM:
OBSTETRIC <14 WK ULTRASOUND
TECHNIQUE: Transabdominal ultrasound was performed for evaluation of the
gestation as well as the maternal uterus and adnexal regions.

[Series 1: us ob comp less 14 wk · 15 of 36 slices shown]
[im 1/36]
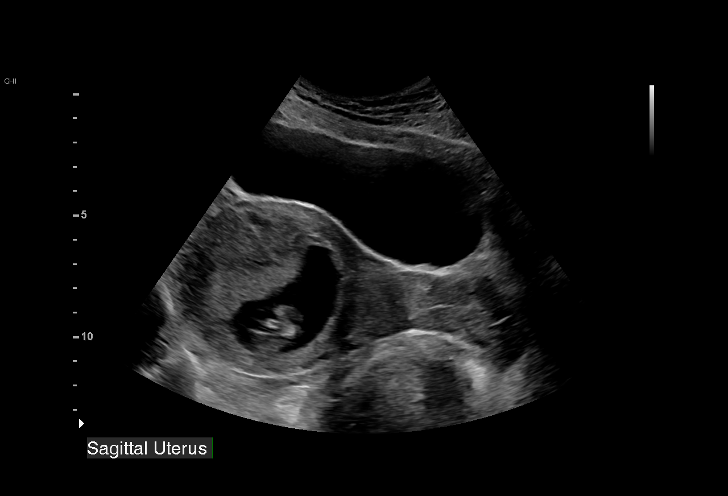
[im 3/36]
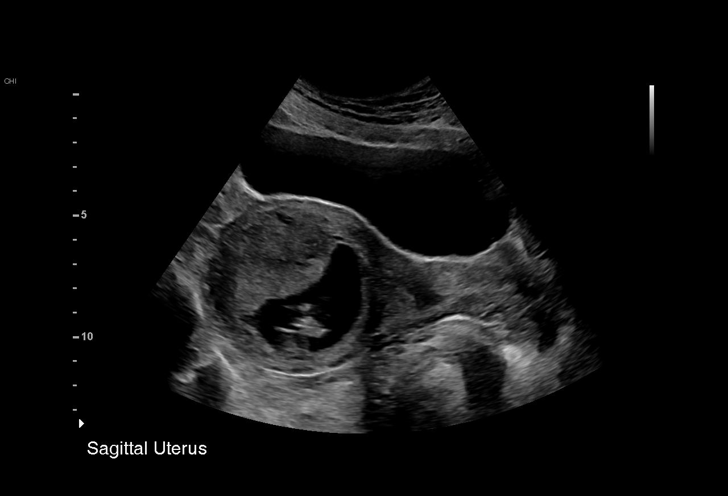
[im 6/36]
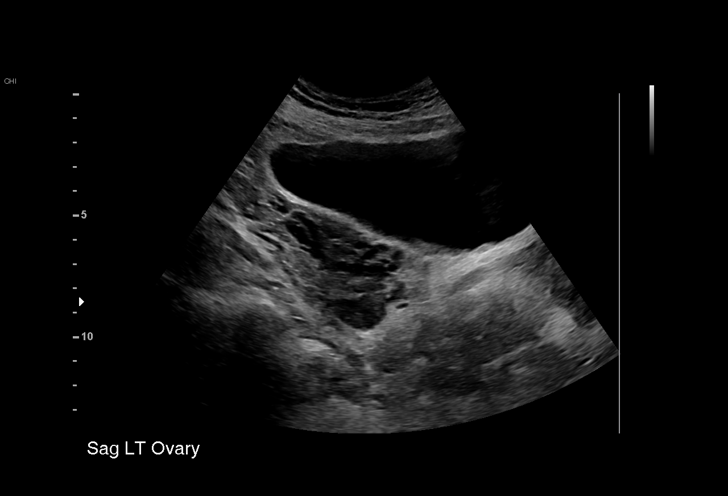
[im 8/36]
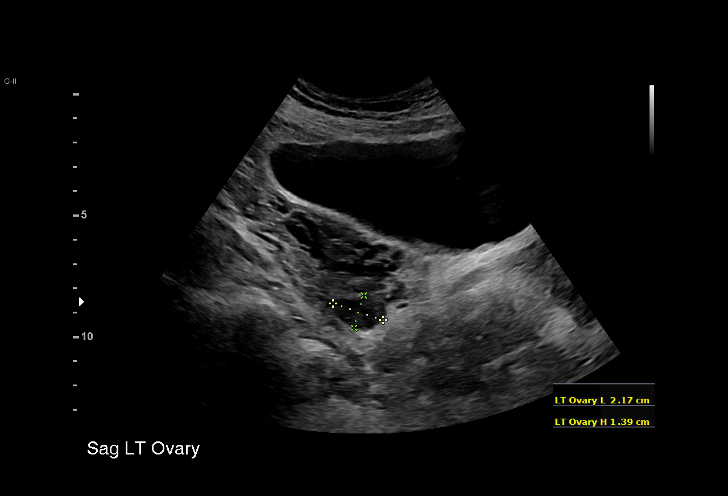
[im 11/36]
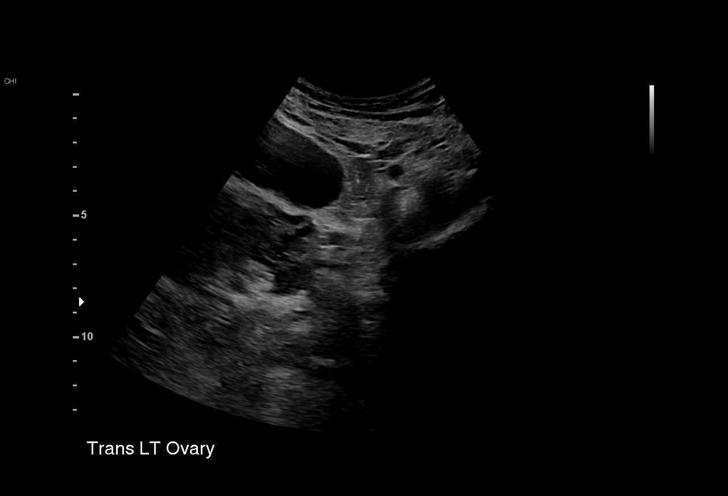
[im 13/36]
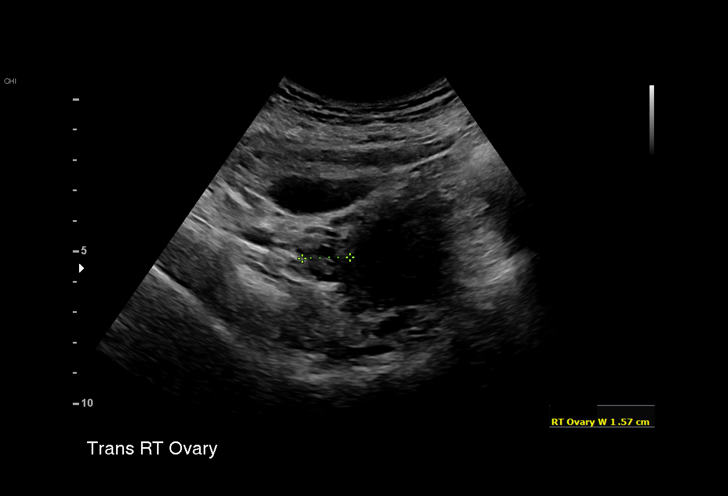
[im 16/36]
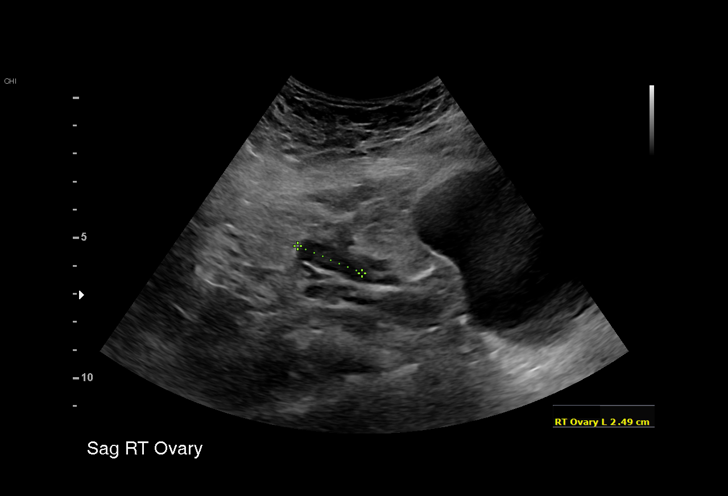
[im 19/36]
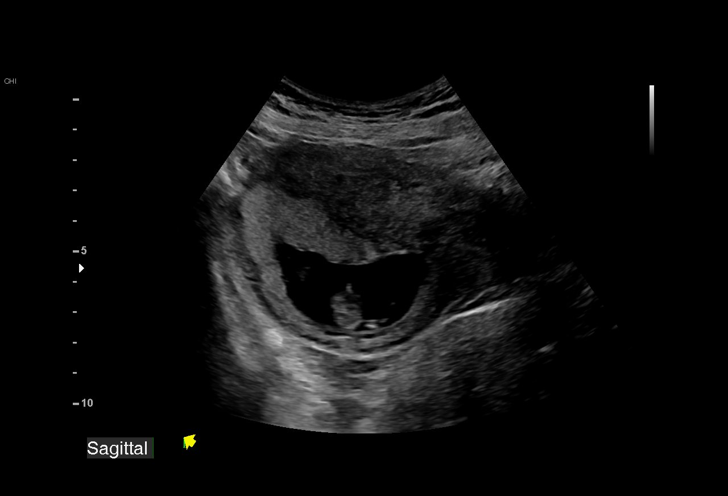
[im 20/36]
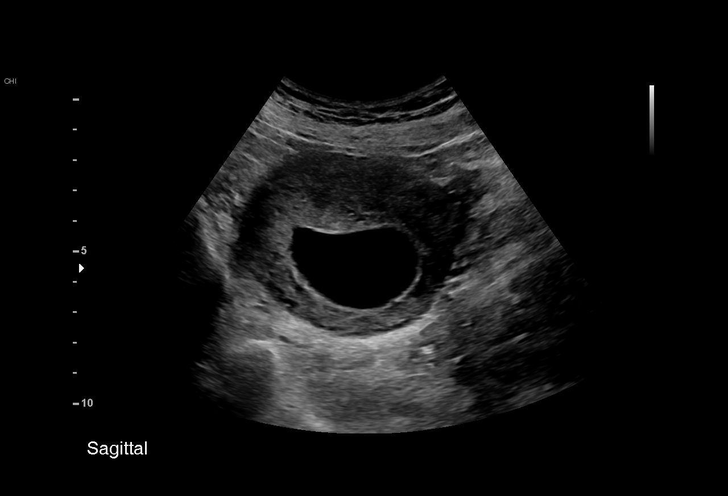
[im 23/36]
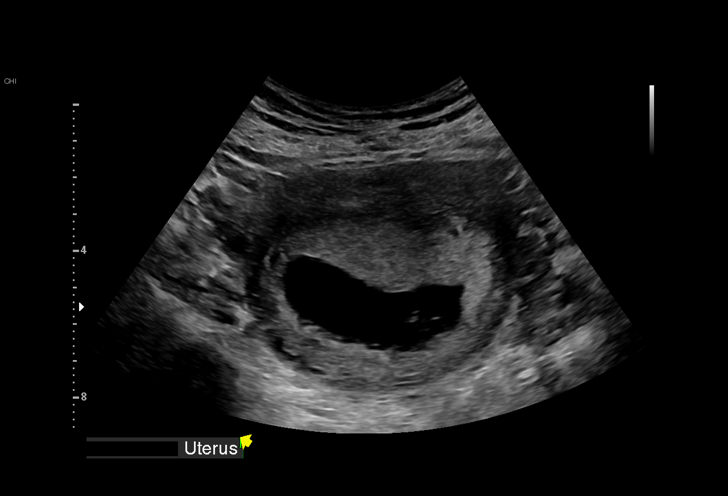
[im 25/36]
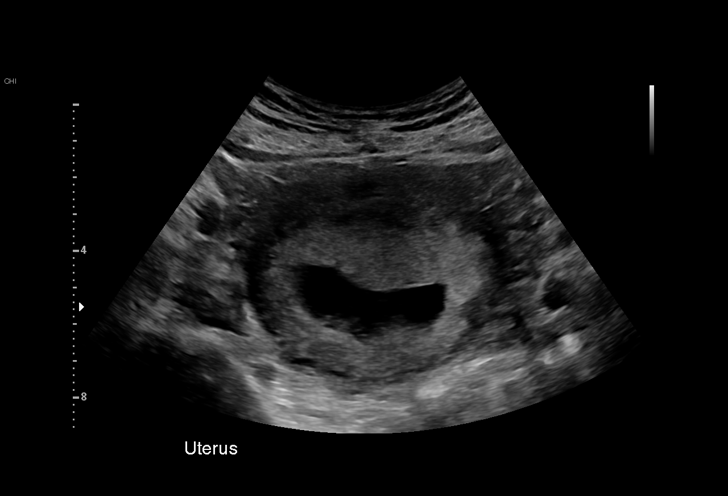
[im 28/36]
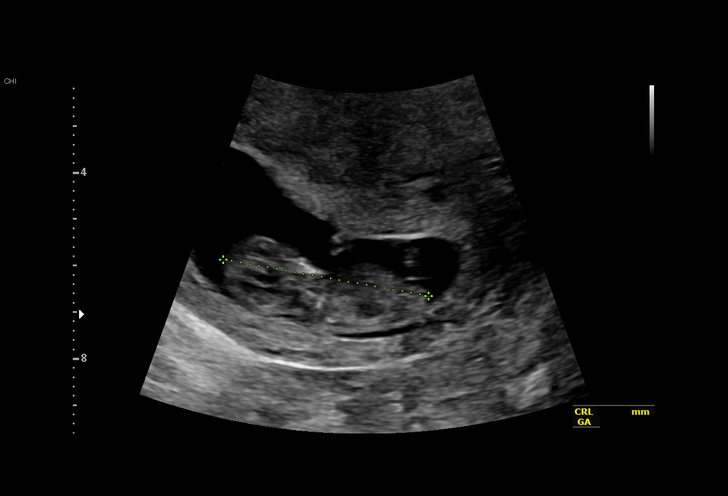
[im 30/36]
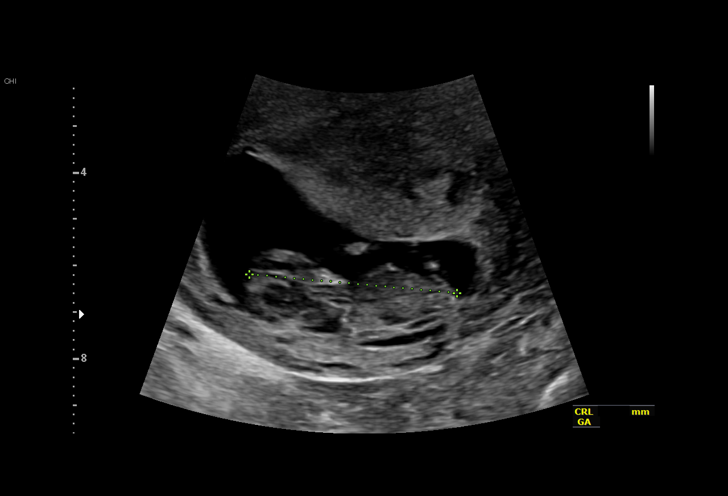
[im 33/36]
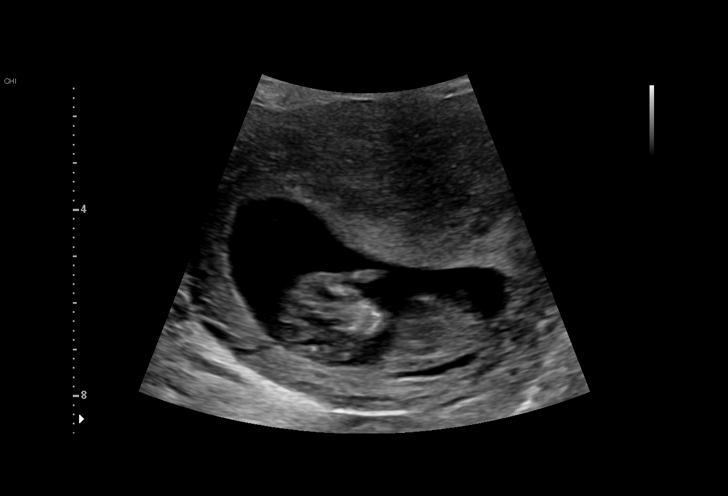
[im 36/36]
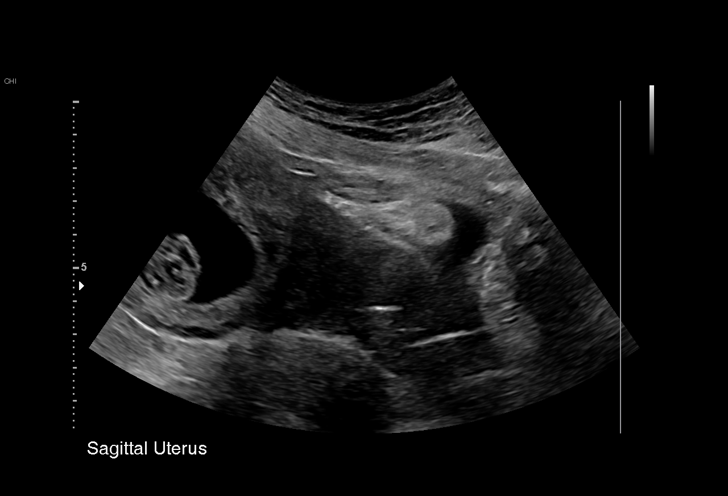

[15 of 28 positions shown; findings below may reference images not displayed]

FINDINGS: Intrauterine gestational sac: Present, single

Yolk sac:  Not identified

Embryo:  Present

Cardiac Activity: Present

Heart Rate: 173 bpm

CRL:   44.7 mm   11 w 1 d                  US EDC: 04/19/2020

Subchorionic hemorrhage:  None visualized.

Maternal uterus/adnexae:

RIGHT ovary normal size and morphology, 2.5 x 0.9 x 1.6 cm.

LEFT ovary normal size and morphology, 1.7 x 2.2 x 1.4 cm.

No free pelvic fluid or adnexal masses.
IMPRESSION: Single live intrauterine gestation at 11 weeks 1 day EGA by
crown-rump length.

No acute abnormalities.

## 2020-10-08 DIAGNOSIS — F53 Postpartum depression: Secondary | ICD-10-CM | POA: Diagnosis not present

## 2020-10-08 DIAGNOSIS — F3171 Bipolar disorder, in partial remission, most recent episode hypomanic: Secondary | ICD-10-CM | POA: Diagnosis not present

## 2020-10-08 DIAGNOSIS — F603 Borderline personality disorder: Secondary | ICD-10-CM | POA: Diagnosis not present

## 2020-10-08 DIAGNOSIS — F4312 Post-traumatic stress disorder, chronic: Secondary | ICD-10-CM | POA: Diagnosis not present

## 2020-10-31 IMAGING — US US MFM OB COMP +14 WKS
1 series · 13 of 28 positions shown · non-contrast
Comparison: none

[Series 1: us mfm ob comp +14 wks · 113 acquisitions, 13 frames shown]
[im 5/113]
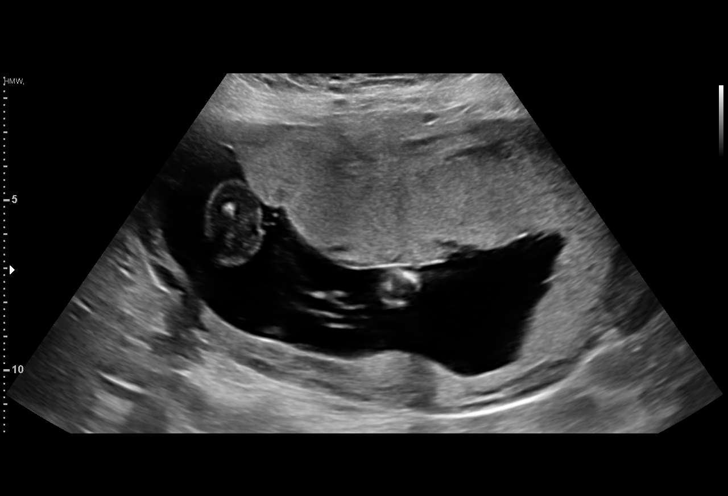
[im 13/113]
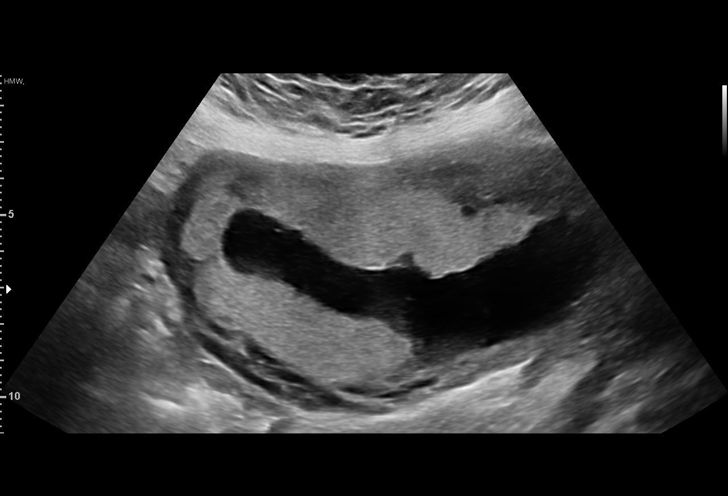
[im 21/113]
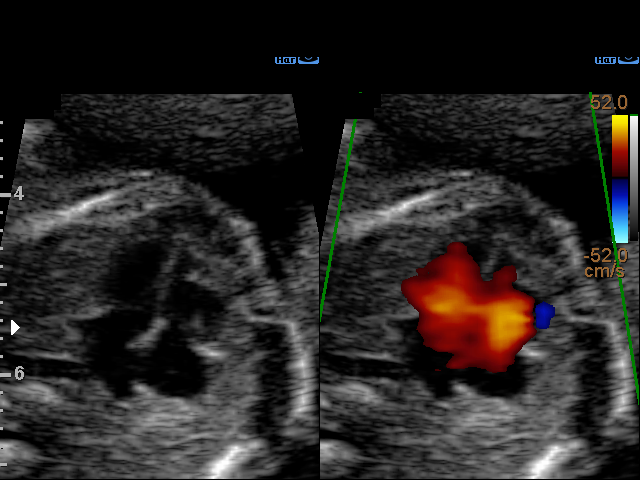
[im 30/113]
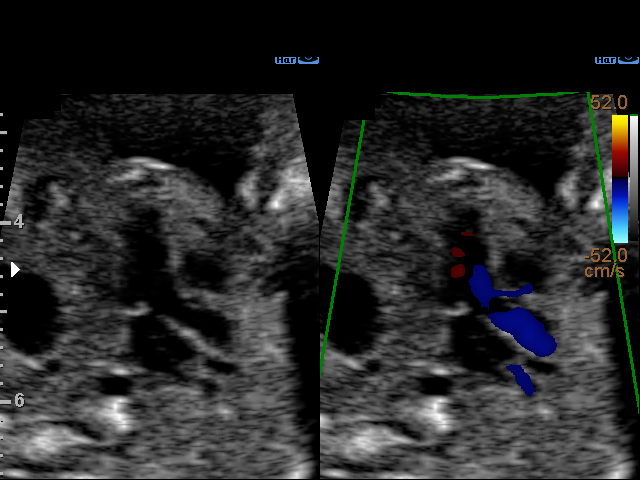
[im 38/113]
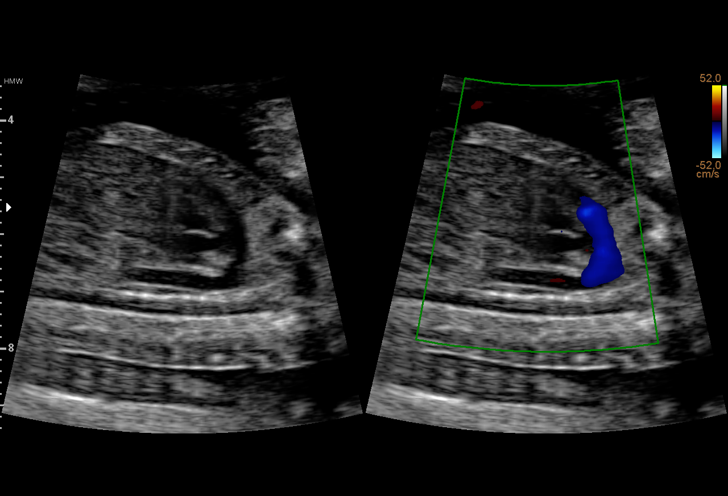
[im 46/113]
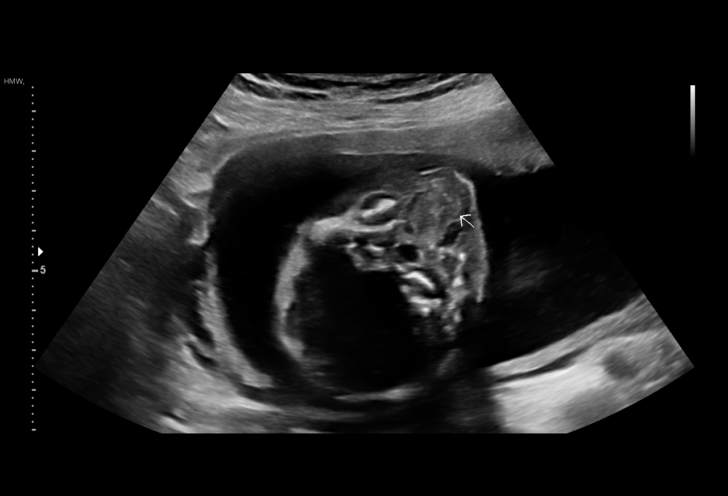
[im 59/113]
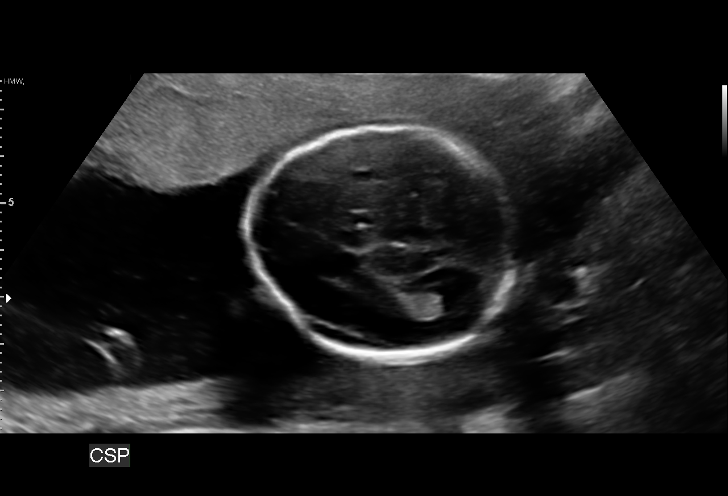
[im 67/113]
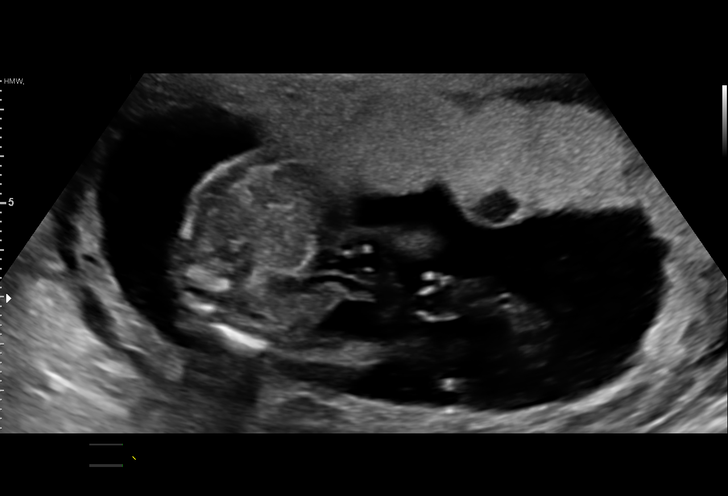
[im 75/113]
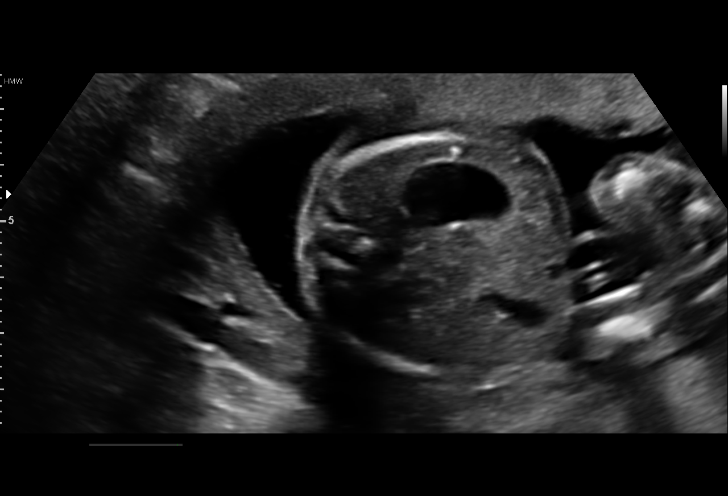
[im 83/113]
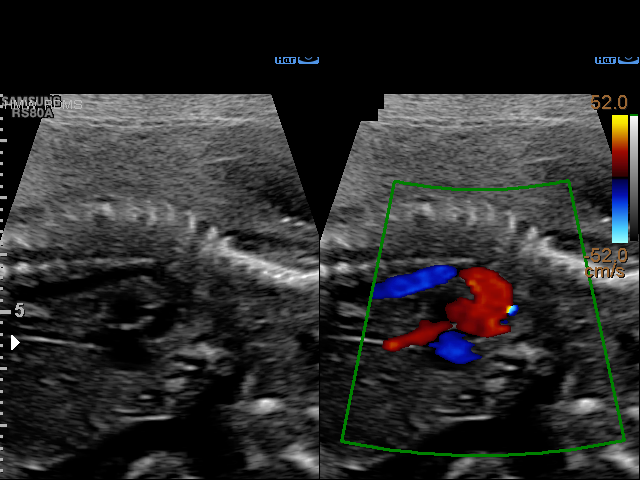
[im 92/113]
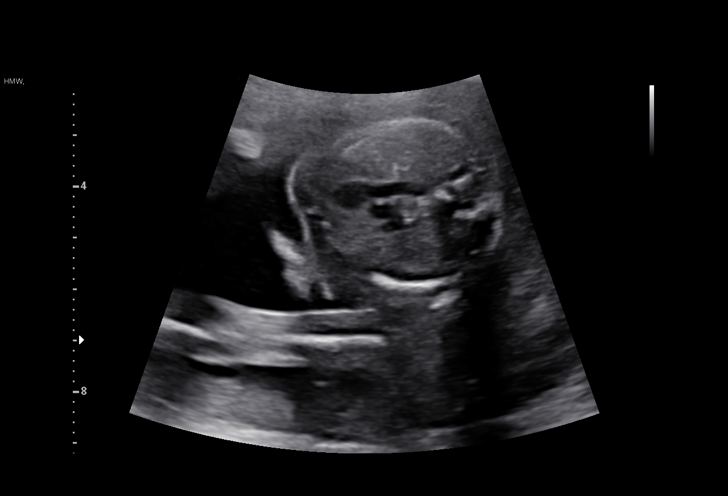
[im 100/113]
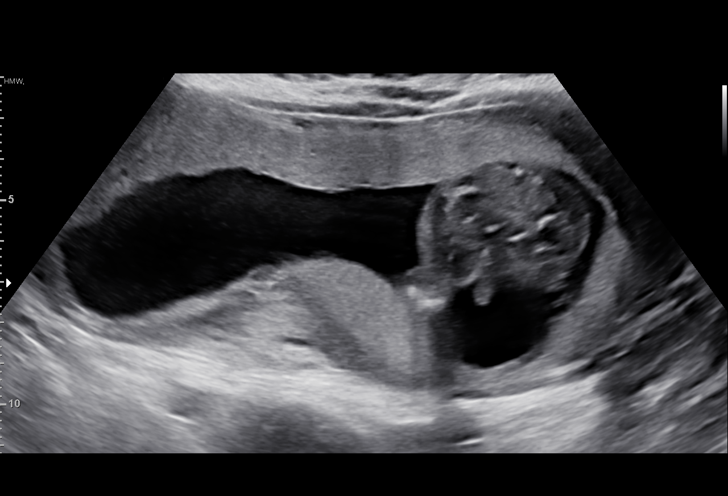
[im 108/113]
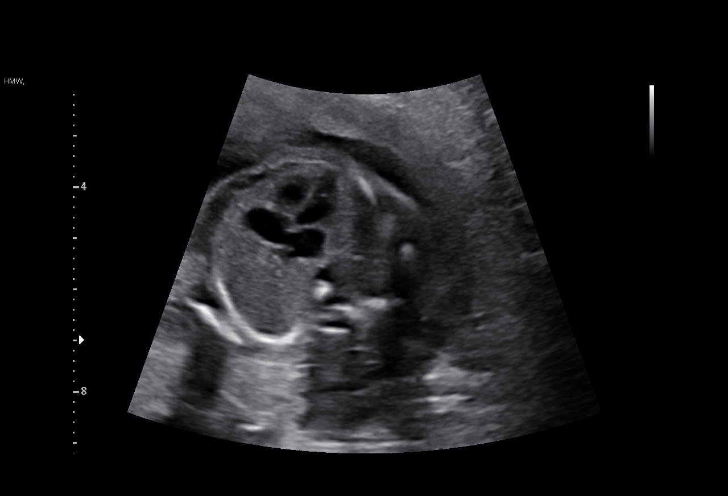

[13 of 28 positions shown; findings below may reference images not displayed]

Indications

 Encounter for antenatal screening for
 malformations
 Low risk NIPS, 1.SQQ, neg AFP, neg Horizon
 19 weeks gestation of pregnancy
Fetal Evaluation

 Num Of Fetuses:         1
 Cardiac Activity:       Observed
 Presentation:           Cephalic
 Placenta:               Left lateral
 P. Cord Insertion:      Visualized, central

 Amniotic Fluid
 AFI FV:      Within normal limits

                             Largest Pocket(cm)

Biometry

 BPD:      47.8  mm     G. Age:  20w 3d         79  %    CI:        80.82   %    70 - 86
                                                         FL/HC:      17.8   %    16.8 -
 HC:      167.9  mm     G. Age:  19w 3d         30  %    HC/AC:      1.08        1.09 -
 AC:      155.5  mm     G. Age:  20w 5d         77  %    FL/BPD:     62.6   %
 FL:       29.9  mm     G. Age:  19w 2d         26  %    FL/AC:      19.2   %    20 - 24
 HUM:      30.1  mm     G. Age:  20w 0d         58  %
 CER:      20.6  mm     G. Age:  19w 4d         48  %
 NFT:       3.7  mm
 CM:        4.9  mm
 Est. FW:     325  gm    0 lb 11 oz      62  %
OB History

 Gravidity:    2         Term:   1        Prem:   0        SAB:   0
 TOP:          0       Ectopic:  0        Living: 1
Gestational Age

 LMP:           22w 3d        Date:  06/25/19                 EDD:   03/31/20
 U/S Today:     20w 0d                                        EDD:   04/17/20
 Best:          19w 5d     Det. By:  Early Ultrasound         EDD:   04/19/20
                                     (09/30/19)
Anatomy

 Cranium:               Appears normal         LVOT:                   Appears normal
 Cavum:                 Appears normal         Aortic Arch:            Appears normal
 Ventricles:            Appears normal         Ductal Arch:            Appears normal
 Choroid Plexus:        Appears normal         Diaphragm:              Appears normal
 Cerebellum:            Appears normal         Stomach:                Appears normal, left
                                                                       sided
 Posterior Fossa:       Appears normal         Abdomen:                Appears normal
 Nuchal Fold:           Appears normal         Abdominal Wall:         Appears nml (cord
                                                                       insert, abd wall)
 Face:                  Appears normal         Cord Vessels:           Appears normal (3
                        (orbits and profile)                           vessel cord)
 Lips:                  Appears normal         Kidneys:                Appear normal
 Palate:                Appears normal         Bladder:                Appears normal
 Thoracic:              Appears normal         Spine:                  Appears normal
 Heart:                 Appears normal         Upper Extremities:      Appears normal
                        (4CH, axis, and
                        situs)
 RVOT:                  Appears normal         Lower Extremities:      Appears normal

 Other:  Heels and Lt 5th digit visualized. 3VV visualized. Fetus appears to be
         a male. Nasal bone visualized. Technically difficult due to fetal
         position.
Cervix Uterus Adnexa

 Cervix
 Length:            3.1  cm.
 Normal appearance by transabdominal scan.

 Uterus
 No abnormality visualized.

 Right Ovary
 No adnexal mass visualized.

 Left Ovary
 No adnexal mass visualized.

 Cul De Sac
 No free fluid seen.

 Adnexa
 No abnormality visualized.
Comments

 This patient was seen for a detailed fetal anatomy scan.
 She denies any significant past medical history and denies
 any problems in her current pregnancy.
 She had a cell free DNA test earlier in her pregnancy which
 indicated a low risk for trisomy 21, 18, and 13. A male fetus is
 predicted.
 She was informed that the fetal growth and amniotic fluid
 level were appropriate for her gestational age.
 There were no obvious fetal anomalies noted on today's
 ultrasound exam.
 The patient was informed that anomalies may be missed due
 to technical limitations. If the fetus is in a suboptimal position
 or maternal habitus is increased, visualization of the fetus in
 the maternal uterus may be impaired.
 Follow up as indicated.

## 2020-11-20 DIAGNOSIS — F3171 Bipolar disorder, in partial remission, most recent episode hypomanic: Secondary | ICD-10-CM | POA: Diagnosis not present

## 2020-11-20 DIAGNOSIS — F53 Postpartum depression: Secondary | ICD-10-CM | POA: Diagnosis not present

## 2020-11-20 DIAGNOSIS — F603 Borderline personality disorder: Secondary | ICD-10-CM | POA: Diagnosis not present

## 2020-11-20 DIAGNOSIS — F4312 Post-traumatic stress disorder, chronic: Secondary | ICD-10-CM | POA: Diagnosis not present

## 2020-12-07 DIAGNOSIS — Z3046 Encounter for surveillance of implantable subdermal contraceptive: Secondary | ICD-10-CM | POA: Diagnosis not present

## 2021-01-20 ENCOUNTER — Ambulatory Visit: Payer: Medicaid Other | Admitting: Nurse Practitioner

## 2021-02-23 DIAGNOSIS — M545 Low back pain, unspecified: Secondary | ICD-10-CM | POA: Diagnosis not present

## 2021-03-18 DIAGNOSIS — F603 Borderline personality disorder: Secondary | ICD-10-CM | POA: Diagnosis not present

## 2021-03-18 DIAGNOSIS — F4312 Post-traumatic stress disorder, chronic: Secondary | ICD-10-CM | POA: Diagnosis not present

## 2021-03-18 DIAGNOSIS — F3171 Bipolar disorder, in partial remission, most recent episode hypomanic: Secondary | ICD-10-CM | POA: Diagnosis not present

## 2021-03-18 DIAGNOSIS — F53 Postpartum depression: Secondary | ICD-10-CM | POA: Diagnosis not present

## 2021-06-10 DIAGNOSIS — F603 Borderline personality disorder: Secondary | ICD-10-CM | POA: Diagnosis not present

## 2021-06-10 DIAGNOSIS — F4312 Post-traumatic stress disorder, chronic: Secondary | ICD-10-CM | POA: Diagnosis not present

## 2021-06-10 DIAGNOSIS — F319 Bipolar disorder, unspecified: Secondary | ICD-10-CM | POA: Diagnosis not present

## 2021-09-02 DIAGNOSIS — F319 Bipolar disorder, unspecified: Secondary | ICD-10-CM | POA: Diagnosis not present

## 2021-09-02 DIAGNOSIS — F4312 Post-traumatic stress disorder, chronic: Secondary | ICD-10-CM | POA: Diagnosis not present

## 2021-09-02 DIAGNOSIS — F603 Borderline personality disorder: Secondary | ICD-10-CM | POA: Diagnosis not present

## 2022-01-11 DIAGNOSIS — Z30011 Encounter for initial prescription of contraceptive pills: Secondary | ICD-10-CM | POA: Diagnosis not present

## 2022-01-20 DIAGNOSIS — F603 Borderline personality disorder: Secondary | ICD-10-CM | POA: Diagnosis not present

## 2022-01-20 DIAGNOSIS — F4312 Post-traumatic stress disorder, chronic: Secondary | ICD-10-CM | POA: Diagnosis not present

## 2022-01-20 DIAGNOSIS — F319 Bipolar disorder, unspecified: Secondary | ICD-10-CM | POA: Diagnosis not present

## 2022-03-17 DIAGNOSIS — F4312 Post-traumatic stress disorder, chronic: Secondary | ICD-10-CM | POA: Diagnosis not present

## 2022-03-17 DIAGNOSIS — F319 Bipolar disorder, unspecified: Secondary | ICD-10-CM | POA: Diagnosis not present

## 2022-03-17 DIAGNOSIS — F603 Borderline personality disorder: Secondary | ICD-10-CM | POA: Diagnosis not present

## 2022-08-08 DIAGNOSIS — F319 Bipolar disorder, unspecified: Secondary | ICD-10-CM | POA: Diagnosis not present

## 2022-08-08 DIAGNOSIS — F603 Borderline personality disorder: Secondary | ICD-10-CM | POA: Diagnosis not present

## 2022-08-08 DIAGNOSIS — F4312 Post-traumatic stress disorder, chronic: Secondary | ICD-10-CM | POA: Diagnosis not present

## 2022-08-12 DIAGNOSIS — Z3041 Encounter for surveillance of contraceptive pills: Secondary | ICD-10-CM | POA: Diagnosis not present

## 2022-12-05 DIAGNOSIS — F319 Bipolar disorder, unspecified: Secondary | ICD-10-CM | POA: Diagnosis not present

## 2022-12-05 DIAGNOSIS — F603 Borderline personality disorder: Secondary | ICD-10-CM | POA: Diagnosis not present

## 2022-12-05 DIAGNOSIS — F4312 Post-traumatic stress disorder, chronic: Secondary | ICD-10-CM | POA: Diagnosis not present

## 2022-12-14 DIAGNOSIS — F4320 Adjustment disorder, unspecified: Secondary | ICD-10-CM | POA: Diagnosis not present

## 2022-12-20 DIAGNOSIS — F4320 Adjustment disorder, unspecified: Secondary | ICD-10-CM | POA: Diagnosis not present

## 2022-12-27 DIAGNOSIS — F4320 Adjustment disorder, unspecified: Secondary | ICD-10-CM | POA: Diagnosis not present

## 2023-01-10 DIAGNOSIS — F4320 Adjustment disorder, unspecified: Secondary | ICD-10-CM | POA: Diagnosis not present

## 2023-03-07 ENCOUNTER — Encounter: Payer: Self-pay | Admitting: Emergency Medicine

## 2023-03-07 ENCOUNTER — Ambulatory Visit
Admission: EM | Admit: 2023-03-07 | Discharge: 2023-03-07 | Disposition: A | Discharge status: Home / Self Care | Payer: Medicaid Other | Attending: Nurse Practitioner | Admitting: Nurse Practitioner

## 2023-03-07 ENCOUNTER — Other Ambulatory Visit: Payer: Self-pay

## 2023-03-07 DIAGNOSIS — J0100 Acute maxillary sinusitis, unspecified: Secondary | ICD-10-CM | POA: Diagnosis not present

## 2023-03-07 MED ORDER — AMOXICILLIN-POT CLAVULANATE 875-125 MG PO TABS: 1.0000 | ORAL_TABLET | Freq: Two times a day (BID) | ORAL | 0 refills | Status: AC

## 2023-03-07 MED ORDER — BENZONATATE 100 MG PO CAPS: 100.0000 mg | ORAL_CAPSULE | Freq: Three times a day (TID) | ORAL | 0 refills | Status: DC | PRN

## 2023-03-07 NOTE — ED Triage Notes (Signed)
Pt sts URI sx with sinus pressure, nasal congestion and cough x 8 days; pt sts left ear pain x 2 days

## 2023-03-07 NOTE — ED Provider Notes (Signed)
[facility]    CSN: [id number] Arrival date & time: 03/07/23  0854      History   Chief Complaint Chief Complaint  Patient presents with   URI   Otalgia    HPI Julie [name] is a 27 y.o. female.   Patient presents today with 8-day history of congested cough, runny and stuffy nose, sore throat, sinus pressure and headache, left ear pain without drainage, decreased appetite, and fatigue.  No fever, body aches or chills, shortness of breath or chest pain, ear drainage, abdominal pain, nausea/vomiting, or diarrhea.  Has taken ibuprofen and Sudafed a couple of times for symptoms without improvement.  Reports LMP 02/27/2023.    Past Medical History:  Diagnosis Date   Anxiety    Asthma    Depression     Patient Active Problem List   Diagnosis Date Noted   Nexplanon in place 05/11/2020   Encounter for initial prescription of implantable subdermal contraceptive    Generalized anxiety disorder 05/16/2018   Obsessive compulsive disorder 05/16/2018   Bipolar I disorder, moderate, current or most recent episode manic, in partial remission, with anxious distress (HCC) 09/10/2013   Acne 07/26/2011    Past Surgical History:  Procedure Laterality Date   INSERTION OF IMPLANON ROD  04/13/2020       NO PAST SURGERIES      OB History     Gravida  2   Para  2   Term  2   Preterm      AB      Living  2      SAB      IAB      Ectopic      Multiple  0   Live Births  2            Home Medications    Prior to Admission medications   Medication Sig Start Date End Date Taking? Authorizing Provider  amoxicillin-clavulanate (AUGMENTIN) 875-125 MG tablet Take 1 tablet by mouth 2 (two) times daily for 7 days. 03/07/23 03/14/23 Yes Valentino Nose, NP  benzonatate (TESSALON) 100 MG capsule Take 1 capsule (100 mg total) by mouth 3 (three) times daily as needed for cough. Do not take with alcohol or while driving or operating heavy machinery.  May  cause drowsiness. 03/07/23  Yes Valentino Nose, NP  acetaminophen (TYLENOL) 325 MG tablet Take 2 tablets (650 mg total) by mouth every 6 (six) hours as needed for mild pain, moderate pain, fever or headache. Patient not taking: Reported on 05/11/2020 04/14/20   Glade Lloyd, MD  coconut oil OIL Apply 1 application topically as needed (nipple pain). Patient not taking: Reported on 05/11/2020 04/14/20   Glade Lloyd, MD  Etonogestrel (NEXPLANON [place]) Inject 1 Device into the skin once. Inserted in left arm on 10.11.2021 Patient not taking: Reported on 05/11/2020    [provider]  ferrous sulfate 325 (65 FE) MG EC tablet Take 1 tablet (325 mg total) by mouth every other day. Patient not taking: Reported on 05/11/2020 01/22/20 07/20/20  Venora Maples, MD  ibuprofen (ADVIL) 800 MG tablet Take 1 tablet (800 mg total) by mouth every 8 (eight) hours as needed. 06/03/20   Brock Bad, MD  norethindrone-ethinyl estradiol 1/35 (ORTHO-NOVUM 1/35, 28,) tablet Take 1 tablet by mouth daily. 06/03/20   Brock Bad, MD  Prenatal Vit-Fe Fumarate-FA (PRENATAL MULTIVITAMIN) TABS tablet Take 1 tablet by mouth daily at 12 noon.  [provider]    Family History Family History  Problem Relation Age of Onset   Bipolar disorder Father    Alcohol abuse Paternal Grandmother    Healthy Mother     Social History Social History   Tobacco Use   Smoking status: Never   Smokeless tobacco: Never  Vaping Use   Vaping status: Never Used  Substance Use Topics   Alcohol use: No    Comment: hx of substance abuse.Denies all now   Drug use: No    Comment: History of drug use/clean 3 months     Allergies   Patient has no known allergies.   Review of Systems Review of Systems Per HPI  Physical Exam Triage Vital Signs ED Triage Vitals  Encounter Vitals Group     BP 03/07/23 1002 131/81     Systolic BP Percentile --      Diastolic BP Percentile --      Pulse Rate  03/07/23 1002 98     Resp 03/07/23 1002 18     Temp 03/07/23 1002 98.2 F (36.8 C)     Temp Source 03/07/23 1002 Oral     SpO2 03/07/23 1002 99 %     Weight --      Height --      Head Circumference --      Peak Flow --      Pain Score 03/07/23 1003 2     Pain Loc --      Pain Education --      Exclude from Growth Chart --    No data found.  Updated Vital Signs BP 131/81 (BP Location: Left Arm)   Pulse 98   Temp 98.2 F (36.8 C) (Oral)   Resp 18   SpO2 99%   Visual Acuity Right Eye Distance:   Left Eye Distance:   Bilateral Distance:    Right Eye Near:   Left Eye Near:    Bilateral Near:     Physical Exam Vitals and nursing note reviewed.  Constitutional:      General: She is not in acute distress.    Appearance: Normal appearance. She is not ill-appearing or toxic-appearing.  HENT:     Head: Normocephalic and atraumatic.     Right Ear: Tympanic membrane, ear canal and external ear normal.     Left Ear: Tympanic membrane, ear canal and external ear normal.     Nose: Congestion and rhinorrhea present.     Right Sinus: Maxillary sinus tenderness present.     Left Sinus: Maxillary sinus tenderness present.     Mouth/Throat:     Mouth: Mucous membranes are moist.     Pharynx: Oropharynx is clear. No oropharyngeal exudate or posterior oropharyngeal erythema.  Eyes:     General: No scleral icterus.    Extraocular Movements: Extraocular movements intact.  Cardiovascular:     Rate and Rhythm: Normal rate and regular rhythm.  Pulmonary:     Effort: Pulmonary effort is normal. No respiratory distress.     Breath sounds: Normal breath sounds. No wheezing, rhonchi or rales.  Musculoskeletal:     Cervical back: Normal range of motion and neck supple.  Lymphadenopathy:     Cervical: No cervical adenopathy.  Skin:    General: Skin is warm and dry.     Coloration: Skin is not jaundiced or pale.     Findings: No erythema or rash.  Neurological:     Mental Status: She  is alert and  oriented to person, place, and time.  Psychiatric:        Behavior: Behavior is cooperative.      UC Treatments / Results  Labs (all labs ordered are listed, but only abnormal results are displayed) Labs Reviewed - No data to display  EKG   Radiology No results found.  Procedures Procedures (including critical care time)  Medications Ordered in UC Medications - No data to display  Initial Impression / Assessment and Plan / UC Course  I have reviewed the triage vital signs and the nursing notes.  Pertinent labs & imaging results that were available during my care of the patient were reviewed by me and considered in my medical decision making (see chart for details).   Patient is well-appearing, normotensive, afebrile, not tachycardic, not tachypneic, oxygenating well on room air.    1. Acute non-recurrent maxillary sinusitis Treat with Augmentin twice daily for 7 days Start cough Perles as needed for dry cough Start Mucinex, increase hydration with water ER and return precautions discussed with patient  The patient was given the opportunity to ask questions.  All questions answered to their satisfaction.  The patient is in agreement to this plan.     Final Clinical Impressions(s) / UC Diagnoses   Final diagnoses:  Acute non-recurrent maxillary sinusitis     Discharge Instructions      You have a sinus infection.  Take the Augmentin as prescribed to treat it.  Symptoms should improve over the next week to 10 days.  If you develop chest pain or shortness of breath, go to the emergency room.  Some things that can make you feel better are: - Increased rest - Increasing fluid with water/sugar free electrolytes - Acetaminophen and ibuprofen as needed for fever/pain - Salt water gargling, chloraseptic spray and throat lozenges - OTC guaifenesin (Mucinex) 600 mg twice daily for congestion - Saline sinus flushes or a neti pot - Humidifying the  air -Tessalon Perles every 8 hours as needed for dry cough      ED Prescriptions     Medication Sig Dispense Auth. Provider   amoxicillin-clavulanate (AUGMENTIN) 875-125 MG tablet Take 1 tablet by mouth 2 (two) times daily for 7 days. 14 tablet Cathlean Marseilles A, NP   benzonatate (TESSALON) 100 MG capsule Take 1 capsule (100 mg total) by mouth 3 (three) times daily as needed for cough. Do not take with alcohol or while driving or operating heavy machinery.  May cause drowsiness. 30 capsule Valentino Nose, NP      PDMP not reviewed this encounter.   Valentino Nose, NP 03/07/23 1025

## 2023-03-07 NOTE — Discharge Instructions (Signed)
You have a sinus infection.  Take the Augmentin as prescribed to treat it.  Symptoms should improve over the next week to 10 days.  If you develop chest pain or shortness of breath, go to the emergency room.  Some things that can make you feel better are: - Increased rest - Increasing fluid with water/sugar free electrolytes - Acetaminophen and ibuprofen as needed for fever/pain - Salt water gargling, chloraseptic spray and throat lozenges - OTC guaifenesin (Mucinex) 600 mg twice daily for congestion - Saline sinus flushes or a neti pot - Humidifying the air -Tessalon Perles every 8 hours as needed for dry cough

## 2023-03-28 DIAGNOSIS — F319 Bipolar disorder, unspecified: Secondary | ICD-10-CM | POA: Diagnosis not present

## 2023-03-28 DIAGNOSIS — F4312 Post-traumatic stress disorder, chronic: Secondary | ICD-10-CM | POA: Diagnosis not present

## 2023-05-16 ENCOUNTER — Ambulatory Visit: Payer: Medicaid Other | Admitting: Family Medicine

## 2023-05-16 ENCOUNTER — Telehealth: Payer: Self-pay

## 2023-05-16 ENCOUNTER — Telehealth: Payer: Self-pay | Admitting: Family Medicine

## 2023-05-16 VITALS — BP 132/80 | HR 70 | Temp 97.7°F | Ht 65.5 in | Wt 140.6 lb

## 2023-05-16 DIAGNOSIS — R7982 Elevated C-reactive protein (CRP): Secondary | ICD-10-CM | POA: Diagnosis not present

## 2023-05-16 DIAGNOSIS — Z23 Encounter for immunization: Secondary | ICD-10-CM | POA: Diagnosis not present

## 2023-05-16 DIAGNOSIS — F1911 Other psychoactive substance abuse, in remission: Secondary | ICD-10-CM

## 2023-05-16 DIAGNOSIS — Z636 Dependent relative needing care at home: Secondary | ICD-10-CM

## 2023-05-16 DIAGNOSIS — R700 Elevated erythrocyte sedimentation rate: Secondary | ICD-10-CM | POA: Diagnosis not present

## 2023-05-16 DIAGNOSIS — F3173 Bipolar disorder, in partial remission, most recent episode manic: Secondary | ICD-10-CM

## 2023-05-16 DIAGNOSIS — F428 Other obsessive-compulsive disorder: Secondary | ICD-10-CM | POA: Diagnosis not present

## 2023-05-16 DIAGNOSIS — F411 Generalized anxiety disorder: Secondary | ICD-10-CM | POA: Diagnosis not present

## 2023-05-16 DIAGNOSIS — R5382 Chronic fatigue, unspecified: Secondary | ICD-10-CM | POA: Diagnosis not present

## 2023-05-16 DIAGNOSIS — M2540 Effusion, unspecified joint: Secondary | ICD-10-CM

## 2023-05-16 LAB — COMPREHENSIVE METABOLIC PANEL
ALT: 15 U/L (ref 0–35)
AST: 16 U/L (ref 0–37)
Albumin: 4.3 g/dL (ref 3.5–5.2)
Alkaline Phosphatase: 93 U/L (ref 39–117)
BUN: 10 mg/dL (ref 6–23)
CO2: 24 meq/L (ref 19–32)
Calcium: 9.5 mg/dL (ref 8.4–10.5)
Chloride: 104 meq/L (ref 96–112)
Creatinine, Ser: 0.63 mg/dL (ref 0.40–1.20)
GFR: 122.63 mL/min (ref >60.00)
Glucose, Bld: 92 mg/dL (ref 70–99)
Potassium: 3.9 meq/L (ref 3.5–5.1)
Sodium: 139 meq/L (ref 135–145)
Total Bilirubin: 0.4 mg/dL (ref 0.2–1.2)
Total Protein: 7 g/dL (ref 6.0–8.3)

## 2023-05-16 LAB — CBC WITH DIFFERENTIAL/PLATELET
Basophils Absolute: 0 10*3/uL (ref 0.0–0.1)
Basophils Relative: 0.3 % (ref 0.0–3.0)
Eosinophils Absolute: 0.1 10*3/uL (ref 0.0–0.7)
Eosinophils Relative: 2.3 % (ref 0.0–5.0)
HCT: 36.9 % (ref 36.0–46.0)
Hemoglobin: 12.5 g/dL (ref 12.0–15.0)
Lymphocytes Relative: 33.5 % (ref 12.0–46.0)
Lymphs Abs: 1.8 10*3/uL (ref 0.7–4.0)
MCHC: 33.8 g/dL (ref 30.0–36.0)
MCV: 88.6 fL (ref 78.0–100.0)
Monocytes Absolute: 0.3 10*3/uL (ref 0.1–1.0)
Monocytes Relative: 5.4 % (ref 3.0–12.0)
Neutro Abs: 3.2 10*3/uL (ref 1.4–7.7)
Neutrophils Relative %: 58.5 % (ref 43.0–77.0)
Platelets: 371 10*3/uL (ref 150.0–400.0)
RBC: 4.17 Mil/uL (ref 3.87–5.11)
RDW: 12.8 % (ref 11.5–15.5)
WBC: 5.5 10*3/uL (ref 4.0–10.5)

## 2023-05-16 LAB — URINALYSIS, ROUTINE W REFLEX MICROSCOPIC
Bilirubin Urine: NEGATIVE
Hgb urine dipstick: NEGATIVE
Ketones, ur: NEGATIVE
Leukocytes,Ua: NEGATIVE
Nitrite: NEGATIVE
Specific Gravity, Urine: >=1.03 — AB (ref 1.000–1.030)
Total Protein, Urine: NEGATIVE
Urine Glucose: NEGATIVE
Urobilinogen, UA: 0.2 (ref 0.0–1.0)
pH: 6 (ref 5.0–8.0)

## 2023-05-16 LAB — B12 AND FOLATE PANEL
Folate: 13.4 ng/mL (ref >5.9)
Vitamin B-12: 987 pg/mL — ABNORMAL HIGH (ref 211–911)

## 2023-05-16 LAB — HEMOGLOBIN A1C: Hgb A1c MFr Bld: 5 % (ref 4.6–6.5)

## 2023-05-16 LAB — SEDIMENTATION RATE: Sed Rate: 27 mm/h — ABNORMAL HIGH (ref 0–20)

## 2023-05-16 LAB — HIGH SENSITIVITY CRP: CRP, High Sensitivity: 11.31 mg/L — ABNORMAL HIGH (ref 0.000–5.000)

## 2023-05-16 NOTE — Progress Notes (Signed)
Assessment/Plan:   Problem List Items Addressed This Visit       Other   Bipolar I disorder, moderate, current or most recent episode manic, in partial remission, with anxious distress (HCC)   Relevant Medications   QUEtiapine (SEROQUEL) 100 MG tablet   lamoTRIgine (LAMICTAL) 100 MG tablet   Generalized anxiety disorder   Relevant Medications   QUEtiapine (SEROQUEL) 100 MG tablet   lamoTRIgine (LAMICTAL) 100 MG tablet   Obsessive compulsive disorder   Relevant Medications   QUEtiapine (SEROQUEL) 100 MG tablet   lamoTRIgine (LAMICTAL) 100 MG tablet   Chronic fatigue    Patient presents with a six-month history of persistent fatigue and weakness unrelieved by sleep. Reports needing to eat every three hours due to lightheadedness and extreme hunger.    Broad differential diagnosis:  Nutritional deficiency such as vitamin D, B12/folate, iron deficiency.   Reactive hypoglycemia based on postprandial symptoms.   Sleep disorder.  Possibly given ample amount of sleep that is not restorative. Uncontrolled bipolar depressive disorder along with caregiver stress from child with autism. Less likely, as endorses normal mood.  PHQ borderline elevated. GAD negative.  Rheumatologic/inflammatory arthropathy such as Lyme's disease, rheumatoid arthritis or SLE.  Supported by intermittent joint swelling and pain with positive left hip findings on exam.  Occult infection such as hepatitis B, C, HIV, syphilis. Patient with remote history of substance abuse, denies now.  Pregnancy.  Less likely given birth control, but should be ruled out.  Hypothyroidism. Substance abuse. Anemia. Sedation from mood stabilizers.  Quetiapine is sedating.I's, less likely as patient is without medication change in over a year. History and presentation make it appear to be less likely to be hepatic, renal, cardiac (such as arrhythmia), pulmonary (such as pulmonary embolism, asthma)    Plan:  Broad assessment based of  above conditions based on orders Further workup may include sleep study, ECG, chest x-ray, pulmonary function testing, urine drug screen, further endocrine assessment  Provided nutritional counseling for a balanced diet with complex carbohydrates and protein. Recommend follow-up with psychiatry to discuss mood medications and potential treatment for insomnia Encouraged hydration and regular small meals  Sleep hygiene counseling to improve sleep quality. Monitor symptoms with a diary of fatigue levels, sleep patterns, and hunger episodes. Return in 4 weeks to review laboratory results.      Relevant Orders   Hemoglobin A1c   CBC with Differential/Platelet   Comprehensive metabolic panel   TSH Rfx on Abnormal to Free T4   Hepatitis B core antibody, total   Hepatitis B surface antibody,qualitative   Hepatitis B surface antigen   HCV Ab w Reflex to Quant PCR   HIV Antibody (routine testing w rflx)   RPR   B12 and Folate Panel   Iron, TIBC and Ferritin Panel   Lyme Disease Serology w/Reflex   Fecal occult blood, imunochemical   Beta hCG quant (ref lab)   DG Hand Complete Left   DG Hand Complete Right   DG HIPS BILAT W OR W/O PELVIS MIN 5 VIEWS   Sedimentation rate   CRP High sensitivity   ANA w/Reflex   Rheumatoid Factor   Cyclic citrul peptide antibody, IgG   Urinalysis, Routine w reflex microscopic   Joint swelling - Primary   Relevant Orders   Hemoglobin A1c   CBC with Differential/Platelet   Comprehensive metabolic panel   TSH Rfx on Abnormal to Free T4   Hepatitis B core antibody, total   Hepatitis B surface antibody,qualitative  Hepatitis B surface antigen   HCV Ab w Reflex to Quant PCR   HIV Antibody (routine testing w rflx)   RPR   B12 and Folate Panel   Iron, TIBC and Ferritin Panel   Lyme Disease Serology w/Reflex   Fecal occult blood, imunochemical   Beta hCG quant (ref lab)   DG Hand Complete Left   DG Hand Complete Right   DG HIPS BILAT W OR W/O PELVIS  MIN 5 VIEWS   Sedimentation rate   CRP High sensitivity   ANA w/Reflex   Rheumatoid Factor   Cyclic citrul peptide antibody, IgG   Urinalysis, Routine w reflex microscopic   Caregiver stress   History of substance abuse (HCC)   Relevant Orders   Hepatitis B core antibody, total   Hepatitis B surface antibody,qualitative   Hepatitis B surface antigen   HCV Ab w Reflex to Quant PCR   HIV Antibody (routine testing w rflx)   Other Visit Diagnoses     Immunization due       Relevant Orders   Flu vaccine trivalent PF, 6mos and older(Flulaval,Afluria,Fluarix,Fluzone) (Completed)       Medications Discontinued During This Encounter  Medication Reason   Prenatal Vit-Fe Fumarate-FA (PRENATAL MULTIVITAMIN) TABS tablet    ferrous sulfate 325 (65 FE) MG EC tablet    acetaminophen (TYLENOL) 325 MG tablet    benzonatate (TESSALON) 100 MG capsule    coconut oil OIL    Etonogestrel (NEXPLANON [place])     Return in about 4 weeks (around 06/13/2023) for fatigue and joint swelling.    Subjective:   Encounter date: 05/16/2023  Julie [name] is a 27 y.o. female who has Acne; Bipolar I disorder, moderate, current or most recent episode manic, in partial remission, with anxious distress (HCC); Generalized anxiety disorder; Obsessive compulsive disorder; Encounter for initial prescription of implantable subdermal contraceptive; Nexplanon in place; Chronic fatigue; Joint swelling; Caregiver stress; and History of substance abuse (HCC) on their problem list..   She  has a past medical history of Anxiety, Asthma, Depression, and Hypertension..   Chief Complaint: Fatigue and weakness.  History of Present Illness:  Patient presents with a six-month history of persistent fatigue and weakness unrelieved by sleep. Reports that increased sleep does not alleviate tiredness. Describes needing to eat every three hours due to extreme hunger and lightheadedness; symptoms improve after eating. Experiences  increased irritability associated with hunger and fatigue. Notes joint pains in the hands and left hip, with the hip "popping" and causing brief pain during movement. Denies chest pain, palpitations, shortness of breath, nausea, vomiting, or gastrointestinal bleeding. Reports non-restorative sleep with frequent awakenings despite adequate sleep duration. Denies recent weight changes or fever.  Reports ongoing stress managing a child with autism.  Wt Readings from Last 3 Encounters:  05/16/23 140 lb 9.6 oz (63.8 kg)  06/03/20 189 lb (85.7 kg)  05/11/20 190 lb 6.4 oz (86.4 kg)    Lab Results  Component Value Date   WBC 6.7 04/12/2020   HGB 9.9 (L) 04/12/2020   HCT 32.3 (L) 04/12/2020   MCV 83.0 04/12/2020   PLT 276 04/12/2020   Lab Results  Component Value Date   TSH 0.772 09/11/2013   Lab Results  Component Value Date   NA 136 03/22/2020   K 3.9 03/22/2020   CO2 20 (L) 03/22/2020   BUN <5 (L) 03/22/2020   CREATININE 0.41 (L) 03/22/2020   CALCIUM 8.9 03/22/2020   GLUCOSE 93 03/22/2020     ---------------------------------------------------------------------------------------------------  05/16/2023   10:00 AM  Depression screen PHQ 2/9  Decreased Interest 0  Down, Depressed, Hopeless 0  PHQ - 2 Score 0  Altered sleeping 1  Tired, decreased energy 3  Change in appetite 3  Feeling bad or failure about yourself  0  Trouble concentrating 2  Moving slowly or fidgety/restless 1  Suicidal thoughts 0  PHQ-9 Score 10  Difficult doing work/chores Very difficult      05/16/2023   10:01 AM  GAD 7 : Generalized Anxiety Score  Nervous, Anxious, on Edge 0  Control/stop worrying 0  Worry too much - different things 0  Trouble relaxing 0  Restless 0  Easily annoyed or irritable 2  Afraid - awful might happen 0  Total GAD 7 Score 2  Anxiety Difficulty Not difficult at all      Review of Systems  Constitutional:  Positive for malaise/fatigue. Negative for chills,  diaphoresis, fever and weight loss.  HENT:  Negative for congestion, ear discharge, ear pain, hearing loss and sore throat.   Eyes:  Negative for blurred vision, double vision, photophobia, pain, discharge and redness.  Respiratory:  Negative for cough, sputum production, shortness of breath and wheezing.   Cardiovascular:  Negative for chest pain, palpitations, leg swelling and PND.  Gastrointestinal:  Negative for abdominal pain, blood in stool, constipation, diarrhea, heartburn, melena, nausea and vomiting.  Genitourinary:  Negative for dysuria, flank pain, frequency, hematuria and urgency.       Amenorrhea due toBirth control  Musculoskeletal:  Positive for joint pain and myalgias.  Skin:  Negative for itching and rash.  Neurological:  Positive for dizziness and focal weakness. Negative for tingling, tremors, speech change, seizures, loss of consciousness and headaches.  Endo/Heme/Allergies:  Negative for polydipsia. Does not bruise/bleed easily.       Heat and cold intolerance  Psychiatric/Behavioral:  Negative for depression, hallucinations, memory loss, substance abuse and suicidal ideas. The patient is nervous/anxious. The patient does not have insomnia.        Mild irritability.  All other systems reviewed and are negative.   Past Surgical History:  Procedure Laterality Date   INSERTION OF IMPLANON ROD  04/13/2020       NO PAST SURGERIES      Outpatient Medications Prior to Visit  Medication Sig Dispense Refill   ibuprofen (ADVIL) 800 MG tablet Take 1 tablet (800 mg total) by mouth every 8 (eight) hours as needed. 30 tablet 5   lamoTRIgine (LAMICTAL) 100 MG tablet Take 100 mg by mouth daily.     norethindrone-ethinyl estradiol 1/35 (ORTHO-NOVUM 1/35, 28,) tablet Take 1 tablet by mouth daily. 28 tablet 0   QUEtiapine (SEROQUEL) 100 MG tablet Take 150 mg by mouth at bedtime.     acetaminophen (TYLENOL) 325 MG tablet Take 2 tablets (650 mg total) by mouth every 6 (six) hours as  needed for mild pain, moderate pain, fever or headache. (Patient not taking: Reported on 05/11/2020)     benzonatate (TESSALON) 100 MG capsule Take 1 capsule (100 mg total) by mouth 3 (three) times daily as needed for cough. Do not take with alcohol or while driving or operating heavy machinery.  May cause drowsiness. (Patient not taking: Reported on 05/16/2023) 30 capsule 0   coconut oil OIL Apply 1 application topically as needed (nipple pain). (Patient not taking: Reported on 05/11/2020)  0   Etonogestrel (NEXPLANON [place]) Inject 1 Device into the skin once. Inserted in left arm on 10.11.2021 (Patient not taking: Reported  on 05/11/2020)     ferrous sulfate 325 (65 FE) MG EC tablet Take 1 tablet (325 mg total) by mouth every other day. (Patient not taking: Reported on 05/11/2020) 30 tablet 2   Prenatal Vit-Fe Fumarate-FA (PRENATAL MULTIVITAMIN) TABS tablet Take 1 tablet by mouth daily at 12 noon. (Patient not taking: Reported on 05/16/2023)     No facility-administered medications prior to visit.    Family History  Problem Relation Age of Onset   Bipolar disorder Father    Alcohol abuse Paternal Grandmother    Healthy Mother     Social History   Socioeconomic History   Marital status: Single    Spouse name: Not on file   Number of children: Not on file   Years of education: Not on file   Highest education level: Some college, no degree  Occupational History   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 [employer]  Tobacco Use   Smoking status: Never    Passive exposure: Never   Smokeless tobacco: Never  Vaping Use   Vaping status: Never Used  Substance and Sexual Activity   Alcohol use: No    Comment: hx of substance abuse.Denies all now   Drug use: Not Currently    Comment: History of drug use/clean 3 months   Sexual activity: Yes    Partners: Male    Birth control/protection: Pill, None  Other Topics Concern   Not on file  Social History  Narrative   Not on file   Social Determinants of Health   Financial Resource Strain: Low Risk  (05/16/2023)   Overall Financial Resource Strain (CARDIA)    Difficulty of Paying Living Expenses: Not very hard  Food Insecurity: No Food Insecurity (05/16/2023)   Hunger Vital Sign    Worried About Running Out of Food in the Last Year: Never true    Ran Out of Food in the Last Year: Never true  Transportation Needs: No Transportation Needs (05/16/2023)   PRAPARE - Administrator, Civil Service (Medical): No    Lack of Transportation (Non-Medical): No  Physical Activity: Unknown (05/16/2023)   Exercise Vital Sign    Days of Exercise per Week: Patient declined    Minutes of Exercise per Session: Not on file  Stress: Stress Concern Present (05/16/2023)   [employer] of Occupational Health - Occupational Stress Questionnaire    Feeling of Stress : To some extent  Social Connections: Moderately Isolated (05/16/2023)   Social Connection and Isolation Panel [NHANES]    Frequency of Communication with Friends and Family: More than three times a week    Frequency of Social Gatherings with Friends and Family: Patient declined    Attends Religious Services: Never    Database administrator or Organizations: No    Attends Engineer, structural: Not on file    Marital Status: Living with partner  Intimate Partner Violence: Not on file  Objective:  Physical Exam: BP 132/80 (BP Location: Left Arm, Patient Position: Sitting, Cuff Size: Large)   Pulse 70   Temp 97.7 F (36.5 C) (Temporal)   Ht 5' 5.5" (1.664 m)   Wt 140 lb 9.6 oz (63.8 kg)   LMP  (LMP Unknown)   SpO2 99%   Breastfeeding No   BMI 23.04 kg/m    Wt Readings from Last 3 Encounters:  05/16/23 140 lb 9.6 oz (63.8 kg)  06/03/20 189 lb (85.7 kg)  05/11/20 190 lb 6.4 oz (86.4 kg)      Physical  Exam Constitutional:      General: She is not in acute distress.    Appearance: Normal appearance. She is not ill-appearing or toxic-appearing.  HENT:     Head: Normocephalic and atraumatic.     Nose: Nose normal. No congestion.     Mouth/Throat:     Mouth: Mucous membranes are moist.  Eyes:     General: Vision grossly intact. No scleral icterus.    Extraocular Movements: Extraocular movements intact.     Conjunctiva/sclera: Conjunctivae normal.  Cardiovascular:     Rate and Rhythm: Normal rate and regular rhythm.     Pulses: Normal pulses.     Heart sounds: Normal heart sounds.  Pulmonary:     Effort: Pulmonary effort is normal. No respiratory distress.     Breath sounds: Normal breath sounds.  Abdominal:     General: Abdomen is flat. Bowel sounds are normal.     Palpations: Abdomen is soft.  Musculoskeletal:        General: No swelling.     Right wrist: Normal. No swelling, deformity, effusion or tenderness. Normal range of motion.     Left wrist: Normal. No swelling, deformity, effusion or tenderness. Normal range of motion.     Right hand: No swelling, deformity or tenderness. Normal range of motion. Normal capillary refill. Normal pulse.     Left hand: No swelling, deformity or tenderness. Normal range of motion. Normal capillary refill. Normal pulse.     Cervical back: Normal. No swelling, edema or tenderness. Normal range of motion.     Thoracic back: Normal. No swelling, edema or tenderness. Normal range of motion. No scoliosis.     Lumbar back: No swelling, deformity, spasms or tenderness. Normal range of motion. Negative right straight leg raise test and negative left straight leg raise test. No scoliosis.     Right hip: Crepitus present. No deformity, lacerations or tenderness. Normal range of motion. Normal strength.     Left hip: No deformity. Decreased range of motion. Normal strength.     Right upper leg: Normal.     Left upper leg: Normal.     Right knee: No  swelling or deformity. Normal range of motion.     Left knee: No swelling or deformity. Normal range of motion.     Right lower leg: Normal. No edema.     Left lower leg: Normal. No edema.     Right ankle: Normal.     Left ankle: Normal.     Comments: 5 of 5 strength in upper and lower extremities bilaterally  Positive FADIR on left,  Neg FADIR right Neg FABER bilaterally  Lymphadenopathy:     Cervical: No cervical adenopathy.  Skin:    General: Skin is warm and dry.     Capillary Refill: Capillary refill takes less than 2 seconds.     Findings: No rash.  Neurological:  General: No focal deficit present.     Mental Status: She is oriented to person, place, and time. Mental status is at baseline. She is lethargic.     Cranial Nerves: Cranial nerves 2-12 are intact.     Sensory: Sensation is intact.     Motor: Motor function is intact. No weakness or tremor.     Coordination: Coordination is intact.     Gait: Gait is intact.     Deep Tendon Reflexes: Reflexes normal.  Psychiatric:        Attention and Perception: She is attentive.        Mood and Affect: Affect is not inappropriate.        Speech: Speech normal.        Behavior: Behavior is cooperative.        Thought Content: Thought content does not include suicidal ideation.     No results found.  No results found for this or any previous visit (from the past 2160 hour(s)). Prior labs:   No results found for this or any previous visit (from the past 2160 hour(s)).  Lab Results  Component Value Date   CHOL 218 (H) 09/11/2013   CHOL 207 (H) 10/25/2012   Lab Results  Component Value Date   HDL 76 09/11/2013   HDL 70 10/25/2012   Lab Results  Component Value Date   LDLCALC 126 (H) 09/11/2013   LDLCALC 115 (H) 10/25/2012   Lab Results  Component Value Date   TRIG 82 09/11/2013   TRIG 108 10/25/2012   Lab Results  Component Value Date   CHOLHDL 2.9 09/11/2013   CHOLHDL 3.0 10/25/2012   No results found  for: "LDLDIRECT"  Last metabolic panel Lab Results  Component Value Date   GLUCOSE 93 03/22/2020   NA 136 03/22/2020   K 3.9 03/22/2020   CL 105 03/22/2020   CO2 20 (L) 03/22/2020   BUN <5 (L) 03/22/2020   CREATININE 0.41 (L) 03/22/2020   GFRNONAA >60 03/22/2020   CALCIUM 8.9 03/22/2020   PHOS 3.0 09/07/2013   PROT 6.2 (L) 03/22/2020   ALBUMIN 2.9 (L) 03/22/2020   BILITOT 0.6 03/22/2020   ALKPHOS 154 (H) 03/22/2020   AST 14 (L) 03/22/2020   ALT 9 03/22/2020   ANIONGAP 11 03/22/2020    Lab Results  Component Value Date   HGBA1C 5.3 09/11/2013    Last CBC Lab Results  Component Value Date   WBC 6.7 04/12/2020   HGB 9.9 (L) 04/12/2020   HCT 32.3 (L) 04/12/2020   MCV 83.0 04/12/2020   MCH 25.4 (L) 04/12/2020   RDW 14.5 04/12/2020   PLT 276 04/12/2020    Lab Results  Component Value Date   TSH 0.772 09/11/2013     Last vitamin D No results found for: "25OHVITD2", "25OHVITD3", "VD25OH"  Lab Results  Component Value Date   BILIRUBINUR NEGATIVE 03/22/2020   PROTEINUR 30 (A) 03/22/2020   UROBILINOGEN 0.2 11/25/2013   LEUKOCYTESUR LARGE (A) 03/22/2020    No results found for: "LABMICR", "MICROALBUR"       Garner Nash, MD, MS

## 2023-05-16 NOTE — Telephone Encounter (Signed)
Unable to add on. Blood was sent to [facility] not [facility].

## 2023-05-16 NOTE — Telephone Encounter (Signed)
Contacted Kim at [facility] and she stated that patient hasn't received a pap smear since 10/30/2018.

## 2023-05-16 NOTE — Addendum Note (Signed)
Addended by: Fanny Bien B on: 05/16/2023 04:31 PM   Modules accepted: Orders

## 2023-05-16 NOTE — Telephone Encounter (Signed)
-----   Message from Garnette Gunner sent at 05/16/2023 11:32 AM EST ----- Can we add serum drug screen? As ordered.

## 2023-05-16 NOTE — Telephone Encounter (Signed)
Kim from [facility] called to speak with the nurse per pt's pap smear records. She wants a call back at [phone]

## 2023-05-16 NOTE — Assessment & Plan Note (Addendum)
Patient presents with a six-month history of persistent fatigue and weakness unrelieved by sleep. Reports needing to eat every three hours due to lightheadedness and extreme hunger.    Broad differential diagnosis:  Nutritional deficiency such as vitamin D, B12/folate, iron deficiency.   Reactive hypoglycemia based on postprandial symptoms.   Sleep disorder.  Possibly given ample amount of sleep that is not restorative. Uncontrolled bipolar depressive disorder along with caregiver stress from child with autism. Less likely, as endorses normal mood.  PHQ borderline elevated. GAD negative.  Rheumatologic/inflammatory arthropathy such as Lyme's disease, rheumatoid arthritis or SLE.  Supported by intermittent joint swelling and pain with positive left hip findings on exam.  Occult infection such as hepatitis B, C, HIV, syphilis. Patient with remote history of substance abuse, denies now.  Pregnancy.  Less likely given birth control, but should be ruled out.  Hypothyroidism. Substance abuse. Anemia. Sedation from mood stabilizers.  Quetiapine is sedating.I's, less likely as patient is without medication change in over a year. History and presentation make it appear to be less likely to be hepatic, renal, cardiac (such as arrhythmia), pulmonary (such as pulmonary embolism, asthma)    Plan:  Broad assessment based of above conditions based on orders Further workup may include sleep study, ECG, chest x-ray, pulmonary function testing, urine drug screen, further endocrine assessment  Provided nutritional counseling for a balanced diet with complex carbohydrates and protein. Recommend follow-up with psychiatry to discuss mood medications and potential treatment for insomnia Encouraged hydration and regular small meals  Sleep hygiene counseling to improve sleep quality. Monitor symptoms with a diary of fatigue levels, sleep patterns, and hunger episodes. Return in 4 weeks to review laboratory  results.

## 2023-05-16 NOTE — Patient Instructions (Addendum)
We are checking labs for chronic fatigue including urine, blood, and stool test.   For xrays, go to:    [place] [address] [place], [place], [place] [address] Phone: [phone]  For a Better Night's Sleep  1. Establish a Consistent Sleep Schedule  Go to bed and wake up at the same time every day, even on weekends. This helps regulate your body's internal clock. 2. Create a Relaxing Bedtime Routine  Engage in calming activities before bed, such as reading or taking a warm bath. Avoid screens (phones, tablets, computers) at least 30 minutes before bedtime. 3. Make Your Sleep Environment Comfortable  Ensure your bedroom is cool, quiet, and dark. Invest in a comfortable mattress and pillows. Consider using earplugs or a white noise machine if noise is an issue. 4. Be Mindful of Your Diet  Avoid large meals, caffeine, and alcohol close to bedtime. If you're hungry at night, opt for a light snack. 5. Stay Active During the Day  Regular physical activity can help you fall asleep faster and enjoy deeper sleep. Avoid vigorous exercise close to bedtime. 6. Manage Stress and Anxiety  Practice relaxation techniques such as deep breathing, meditation, or yoga. Keep a journal to write down your thoughts and worries before bed. 7. Limit Naps  If you need to nap, keep it short (20-30 minutes) and avoid napping late in the afternoon. 8. Exposure to [place]  Spend time outside in natural sunlight during the day. This helps regulate your sleep-wake cycle. 9. Avoid Stimulants  Limit the use of stimulants like nicotine and caffeine, especially in the afternoon and evening. 10. Seek Professional Help if Needed  If you continue to have trouble sleeping, consult with your healthcare provider for further evaluation and treatment options. Remember: Good sleep hygiene is essential for your overall health and well-being. Following these tips can help you achieve a more  restful and restorative sleep.

## 2023-05-17 LAB — TSH RFX ON ABNORMAL TO FREE T4: TSH: 0.992 u[IU]/mL (ref 0.450–4.500)

## 2023-05-18 LAB — HIV ANTIBODY (ROUTINE TESTING W REFLEX): HIV 1&2 Ab, 4th Generation: NONREACTIVE

## 2023-05-18 LAB — RPR: RPR Ser Ql: NONREACTIVE

## 2023-05-18 LAB — IRON,TIBC AND FERRITIN PANEL
%SAT: 13 % — ABNORMAL LOW (ref 16–45)
Ferritin: 16 ng/mL (ref 16–154)
Iron: 64 ug/dL (ref 40–190)
TIBC: 508 ug/dL — ABNORMAL HIGH (ref 250–450)

## 2023-05-18 LAB — HEPATITIS B CORE ANTIBODY, TOTAL: Hep B Core Total Ab: NONREACTIVE

## 2023-05-18 LAB — BETA HCG QUANT (REF LAB): hCG Quant: <1 m[IU]/mL

## 2023-05-18 LAB — RHEUMATOID FACTOR: Rheumatoid fact SerPl-aCnc: <10 [IU]/mL (ref <14)

## 2023-05-18 LAB — LYME DISEASE SEROLOGY W/REFLEX: Lyme Total Antibody EIA: NEGATIVE

## 2023-05-18 LAB — CYCLIC CITRUL PEPTIDE ANTIBODY, IGG: Cyclic Citrullin Peptide Ab: <16 U

## 2023-05-18 LAB — HCV INTERPRETATION

## 2023-05-18 LAB — HEPATITIS B SURFACE ANTIBODY,QUALITATIVE: Hep B S Ab: NONREACTIVE

## 2023-05-18 LAB — HEPATITIS B SURFACE ANTIGEN: Hepatitis B Surface Ag: NONREACTIVE

## 2023-05-18 LAB — HCV AB W REFLEX TO QUANT PCR: HCV Ab: NONREACTIVE

## 2023-05-18 LAB — ANA W/REFLEX: Anti Nuclear Antibody (ANA): NEGATIVE

## 2023-05-19 ENCOUNTER — Ambulatory Visit
Admission: RE | Admit: 2023-05-19 | Discharge: 2023-05-19 | Disposition: A | Discharge status: Home / Self Care | Payer: Medicaid Other | Source: Ambulatory Visit | Attending: Family Medicine | Admitting: Family Medicine

## 2023-05-19 DIAGNOSIS — M2540 Effusion, unspecified joint: Secondary | ICD-10-CM

## 2023-05-19 DIAGNOSIS — M25552 Pain in left hip: Secondary | ICD-10-CM | POA: Diagnosis not present

## 2023-05-19 DIAGNOSIS — R5382 Chronic fatigue, unspecified: Secondary | ICD-10-CM | POA: Diagnosis not present

## 2023-05-19 DIAGNOSIS — M7989 Other specified soft tissue disorders: Secondary | ICD-10-CM | POA: Diagnosis not present

## 2023-05-19 DIAGNOSIS — M79641 Pain in right hand: Secondary | ICD-10-CM | POA: Diagnosis not present

## 2023-05-19 DIAGNOSIS — R5383 Other fatigue: Secondary | ICD-10-CM | POA: Diagnosis not present

## 2023-05-19 DIAGNOSIS — M25551 Pain in right hip: Secondary | ICD-10-CM | POA: Diagnosis not present

## 2023-05-26 ENCOUNTER — Telehealth: Payer: Self-pay | Admitting: Family Medicine

## 2023-05-26 NOTE — Telephone Encounter (Signed)
Pt called and said she is waiting on her x-ray from radiology  to dr Janee Morn. Please give a patient a call and update

## 2023-05-29 ENCOUNTER — Telehealth: Payer: Self-pay

## 2023-05-29 ENCOUNTER — Encounter: Payer: Self-pay | Admitting: Family Medicine

## 2023-05-29 NOTE — Telephone Encounter (Signed)
Spoke with Radiology regarding imaging from 05/19/2023. I was advised that she would expedite the readings and we should have them shortly.

## 2023-06-13 ENCOUNTER — Encounter: Payer: Self-pay | Admitting: Family Medicine

## 2023-06-13 ENCOUNTER — Telehealth: Payer: Self-pay | Admitting: Family Medicine

## 2023-06-13 ENCOUNTER — Ambulatory Visit: Payer: Medicaid Other | Admitting: Family Medicine

## 2023-06-13 VITALS — BP 114/70 | HR 106 | Temp 97.2°F | Wt 140.8 lb

## 2023-06-13 DIAGNOSIS — F3173 Bipolar disorder, in partial remission, most recent episode manic: Secondary | ICD-10-CM

## 2023-06-13 DIAGNOSIS — E611 Iron deficiency: Secondary | ICD-10-CM

## 2023-06-13 DIAGNOSIS — D509 Iron deficiency anemia, unspecified: Secondary | ICD-10-CM

## 2023-06-13 DIAGNOSIS — F428 Other obsessive-compulsive disorder: Secondary | ICD-10-CM

## 2023-06-13 DIAGNOSIS — R5382 Chronic fatigue, unspecified: Secondary | ICD-10-CM

## 2023-06-13 DIAGNOSIS — M2540 Effusion, unspecified joint: Secondary | ICD-10-CM

## 2023-06-13 DIAGNOSIS — F411 Generalized anxiety disorder: Secondary | ICD-10-CM

## 2023-06-13 LAB — CBC WITH DIFFERENTIAL/PLATELET
Basophils Absolute: 0 10*3/uL (ref 0.0–0.1)
Basophils Relative: 0.3 % (ref 0.0–3.0)
Eosinophils Absolute: 0.1 10*3/uL (ref 0.0–0.7)
Eosinophils Relative: 2.1 % (ref 0.0–5.0)
HCT: 36.1 % (ref 36.0–46.0)
Hemoglobin: 11.9 g/dL — ABNORMAL LOW (ref 12.0–15.0)
Lymphocytes Relative: 30.1 % (ref 12.0–46.0)
Lymphs Abs: 2 10*3/uL (ref 0.7–4.0)
MCHC: 33 g/dL (ref 30.0–36.0)
MCV: 88.9 fL (ref 78.0–100.0)
Monocytes Absolute: 0.3 10*3/uL (ref 0.1–1.0)
Monocytes Relative: 5.3 % (ref 3.0–12.0)
Neutro Abs: 4.1 10*3/uL (ref 1.4–7.7)
Neutrophils Relative %: 62.2 % (ref 43.0–77.0)
Platelets: 304 10*3/uL (ref 150.0–400.0)
RBC: 4.06 Mil/uL (ref 3.87–5.11)
RDW: 12.8 % (ref 11.5–15.5)
WBC: 6.5 10*3/uL (ref 4.0–10.5)

## 2023-06-13 MED ORDER — IRON (FERROUS SULFATE) 325 (65 FE) MG PO TABS: 325.0000 mg | ORAL_TABLET | Freq: Every day | ORAL | 11 refills | Status: DC

## 2023-06-13 NOTE — Assessment & Plan Note (Signed)
Reports mood stable, but requesting for therapy referral.  Referral placed as requested.

## 2023-06-13 NOTE — Patient Instructions (Signed)
For iron deficiency, we are getting lab work as discussed. Continue iron supplements. Follow-up with rheumatology

## 2023-06-13 NOTE — Telephone Encounter (Signed)
Julie [name] from [facility] service called stating that someone sent a fax to them per pt. Pt does not work with them. And do not have any info regarding pt. [phone]

## 2023-06-13 NOTE — Progress Notes (Signed)
Assessment/Plan:   Problem List Items Addressed This Visit       Other   Bipolar I disorder, moderate, current or most recent episode manic, in partial remission, with anxious distress (HCC)    Reports mood stable, but requesting for therapy referral.  Referral placed as requested.       Relevant Orders   Ambulatory referral to Behavioral Health   Generalized anxiety disorder   Relevant Orders   Ambulatory referral to Behavioral Health   Obsessive compulsive disorder   Relevant Orders   Ambulatory referral to Behavioral Health   Chronic fatigue    Reports improvement in daytime fatigue since starting ferrous sulfate supplementation one month ago. Iron studies previously showed low iron levels without anemia. She is taking ferrous sulfate 325 mg daily without side effects.  Plan: Repeat Iron Studies and CBC Vitamin D Level: To evaluate for deficiency affecting fatigue and mood. Continue Ferrous Sulfate 325 mg daily. Maintain balanced diet rich in iron. Continue sleep hygiene practices. Return in six months or sooner if symptoms worsen. Monitor fatigue levels and record in symptom diary.      Joint swelling    Continues to report joint pains in hands and left hip, with pain exacerbated by cold weather. Imaging studies of hands and left hip were unremarkable except for minor degenerative changes at the pubic symphysis. Laboratory investigations showed elevated CRP at 11, but remaining workup negative including rheumatoid factor and autoimmune panel were negative.  Plan:  Follow up with rheumatology      Other Visit Diagnoses     Iron deficiency    -  Primary   Relevant Medications   Iron, Ferrous Sulfate, 325 (65 Fe) MG TABS   Other Relevant Orders   Iron, TIBC and Ferritin Panel   CBC w/Diff   Vitamin D 1,25 dihydroxy       There are no discontinued medications.  Return in about 6 months (around 12/12/2023), or or sooner if symptoms worsen or fail to improve,  for fatigue.    Subjective:   Encounter date: 06/13/2023  Julie [name] is a 27 y.o. female who has Acne; Bipolar I disorder, moderate, current or most recent episode manic, in partial remission, with anxious distress (HCC); Generalized anxiety disorder; Obsessive compulsive disorder; Encounter for initial prescription of implantable subdermal contraceptive; Nexplanon in place; Chronic fatigue; Joint swelling; Caregiver stress; History of substance abuse (HCC); Elevated C-reactive protein (CRP); and Elevated sed rate on their problem list..   She  has a past medical history of Anxiety, Asthma, Depression, and Hypertension..   Chief Complaint: Follow-up for fatigue and joint pain.  History of Present Illness:  Patient returns for follow-up of persistent fatigue and joint pain. She reports improvement in daytime fatigue since starting ferrous sulfate one month ago, taking 325 mg daily without side effects. Continues to experience joint pain in the hands and left hip, with pain exacerbated by cold weather. Notes that the hip "pops" and causes brief pain during movement. Reports slight improvement in sleep quality, feeling she is sleeping a bit better. Requests a referral to [facility] for mood therapy. Denies chest pain, palpitations, shortness of breath, nausea, vomiting, Raynaud's-like symptoms, or gastrointestinal bleeding.  Review of Systems:  General: Positive for fatigue, improved since starting iron supplementation. Musculoskeletal: Positive for joint pain in hands and left hip; worsens with cold exposure. Neurological: Negative for headaches or dizziness. Cardiovascular: Denies chest pain, palpitations. Respiratory: Denies shortness of breath, wheezing. Gastrointestinal: Denies nausea,  vomiting, or abdominal pain. Genitourinary: Denies polyuria, dysuria. Endocrine: Denies heat or cold intolerance. Psychiatric: Mood is stable; requests referral for therapy; reports  slight improvement in irritability. Sleep: Reports slight improvement; continues to experience non-restorative sleep. Dermatological: Denies rashes or skin changes.  Past Surgical History:  Procedure Laterality Date   INSERTION OF IMPLANON ROD  04/13/2020       NO PAST SURGERIES      Outpatient Medications Prior to Visit  Medication Sig Dispense Refill   ibuprofen (ADVIL) 800 MG tablet Take 1 tablet (800 mg total) by mouth every 8 (eight) hours as needed. 30 tablet 5   lamoTRIgine (LAMICTAL) 100 MG tablet Take 100 mg by mouth daily.     norethindrone-ethinyl estradiol 1/35 (ORTHO-NOVUM 1/35, 28,) tablet Take 1 tablet by mouth daily. 28 tablet 0   QUEtiapine (SEROQUEL) 100 MG tablet Take 150 mg by mouth at bedtime.     No facility-administered medications prior to visit.    Family History  Problem Relation Age of Onset   Bipolar disorder Father    Alcohol abuse Paternal Grandmother    Healthy Mother     Social History   Socioeconomic History   Marital status: Single    Spouse name: Not on file   Number of children: Not on file   Years of education: Not on file   Highest education level: Some college, no degree  Occupational History   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 [employer]  Tobacco Use   Smoking status: Never    Passive exposure: Never   Smokeless tobacco: Never  Vaping Use   Vaping status: Never Used  Substance and Sexual Activity   Alcohol use: No    Comment: hx of substance abuse.Denies all now   Drug use: Not Currently    Comment: History of drug use/clean 3 months   Sexual activity: Yes    Partners: Male    Birth control/protection: Pill, None  Other Topics Concern   Not on file  Social History Narrative   Not on file   Social Determinants of Health   Financial Resource Strain: Low Risk  (05/16/2023)   Overall Financial Resource Strain (CARDIA)    Difficulty of Paying Living Expenses: Not very hard   Food Insecurity: No Food Insecurity (05/16/2023)   Hunger Vital Sign    Worried About Running Out of Food in the Last Year: Never true    Ran Out of Food in the Last Year: Never true  Transportation Needs: No Transportation Needs (05/16/2023)   PRAPARE - Administrator, Civil Service (Medical): No    Lack of Transportation (Non-Medical): No  Physical Activity: Unknown (05/16/2023)   Exercise Vital Sign    Days of Exercise per Week: Patient declined    Minutes of Exercise per Session: Not on file  Stress: Stress Concern Present (05/16/2023)   [employer] of Occupational Health - Occupational Stress Questionnaire    Feeling of Stress : To some extent  Social Connections: Moderately Isolated (05/16/2023)   Social Connection and Isolation Panel [NHANES]    Frequency of Communication with Friends and Family: More than three times a week    Frequency of Social Gatherings with Friends and Family: Patient declined    Attends Religious Services: Never    Database administrator or Organizations: No    Attends Engineer, structural: Not on file    Marital Status: Living with partner  Intimate Partner Violence: Not on file                                                                                                  Objective:  Physical Exam: BP 114/70 (BP Location: Left Arm, Patient Position: Sitting, Cuff Size: Large)   Pulse (!) 106   Temp (!) 97.2 F (36.2 C) (Temporal)   Wt 140 lb 12.8 oz (63.9 kg)   LMP  (LMP Unknown)   SpO2 99%   BMI 23.07 kg/m     Physical Exam Constitutional:      General: She is not in acute distress.    Appearance: Normal appearance. She is not ill-appearing or toxic-appearing.  HENT:     Head: Normocephalic and atraumatic.     Nose: Nose normal. No congestion.     Mouth/Throat:     Mouth: Mucous membranes are moist.  Eyes:     General: Vision grossly intact. No scleral icterus.    Extraocular Movements: Extraocular  movements intact.     Conjunctiva/sclera: Conjunctivae normal.  Cardiovascular:     Rate and Rhythm: Normal rate and regular rhythm.     Pulses: Normal pulses.     Heart sounds: Normal heart sounds.  Pulmonary:     Effort: Pulmonary effort is normal. No respiratory distress.     Breath sounds: Normal breath sounds.  Abdominal:     General: Abdomen is flat. Bowel sounds are normal.     Palpations: Abdomen is soft.  Musculoskeletal:        General: No swelling.     Right wrist: Normal. No swelling, deformity, effusion or tenderness. Normal range of motion.     Left wrist: Normal. No swelling, deformity, effusion or tenderness. Normal range of motion.     Right hand: No swelling, deformity or tenderness. Normal range of motion. Normal capillary refill. Normal pulse.     Left hand: No swelling, deformity or tenderness. Normal range of motion. Normal capillary refill. Normal pulse.     Cervical back: Normal. No swelling, edema or tenderness. Normal range of motion.     Thoracic back: Normal. No swelling, edema or tenderness. Normal range of motion. No scoliosis.     Lumbar back: No swelling, deformity, spasms or tenderness. Normal range of motion. Negative right straight leg raise test and negative left straight leg raise test. No scoliosis.     Right hip: Crepitus present. No deformity, lacerations or tenderness. Normal range of motion. Normal strength.     Left hip: No deformity. Decreased range of motion. Normal strength.     Right upper leg: Normal.     Left upper leg: Normal.     Right knee: No swelling or deformity. Normal range of motion.     Left knee: No swelling or deformity. Normal range of motion.     Right lower leg: Normal. No edema.     Left lower leg: Normal. No edema.     Right ankle: Normal.     Left ankle: Normal.     Comments: 5 of 5 strength in upper and lower  extremities bilaterally  Positive FADIR on left,  Neg FADIR right Neg FABER bilaterally  Lymphadenopathy:      Cervical: No cervical adenopathy.  Skin:    General: Skin is warm and dry.     Capillary Refill: Capillary refill takes less than 2 seconds.     Findings: No rash.  Neurological:     General: No focal deficit present.     Mental Status: She is alert and oriented to person, place, and time. Mental status is at baseline.     Cranial Nerves: Cranial nerves 2-12 are intact.     Sensory: Sensation is intact.     Motor: Motor function is intact. No weakness or tremor.     Coordination: Coordination is intact.     Gait: Gait is intact.     Deep Tendon Reflexes: Reflexes normal.  Psychiatric:        Attention and Perception: She is attentive.        Mood and Affect: Affect is not inappropriate.        Speech: Speech normal.        Behavior: Behavior is cooperative.        Thought Content: Thought content does not include suicidal ideation.     DG HIPS BILAT W OR W/O PELVIS MIN 5 VIEWS  Result Date: 05/29/2023 CLINICAL DATA:  Pain.  Chronic fatigue.  Joint swelling. EXAM: DG HIP (WITH OR WITHOUT PELVIS) 5+V BILAT COMPARISON:  None Available. FINDINGS: Normal bone mineralization. Minimal superior pubic symphysis degenerative spurring. The bilateral sacroiliac and bilateral femoroacetabular joint spaces are maintained. No acute fracture is seen. No dislocation. IMPRESSION: Minimal superior pubic symphysis degenerative spurring. Electronically Signed   By: Neita Garnet M.D.   On: 05/29/2023 13:44   DG Hand Complete Left  Result Date: 05/29/2023 CLINICAL DATA:  Chronic fatigue.  Joint pain and swelling. EXAM: LEFT HAND - COMPLETE 3+ VIEW COMPARISON:  None Available. FINDINGS: Normal bone mineralization. 1 mm ulnar negative variance. No acute fracture is seen. No dislocation. IMPRESSION: Normal left hand radiographs. Electronically Signed   By: Neita Garnet M.D.   On: 05/29/2023 13:43   DG Hand Complete Right  Result Date: 05/29/2023 CLINICAL DATA:  Pain.  Chronic fatigue.  Joint  swelling. EXAM: RIGHT HAND - COMPLETE 3+ VIEW COMPARISON:  None Available. FINDINGS: There is 1.5 mm on a negative variance. Joint spaces are preserved. No acute fracture or dislocation. IMPRESSION: Normal right hand radiographs. Electronically Signed   By: Neita Garnet M.D.   On: 05/29/2023 13:42    Recent Results (from the past 2160 hour(s))  Hemoglobin A1c     Status: None   Collection Time: 05/16/23 10:47 AM  Result Value Ref Range   Hgb A1c MFr Bld 5.0 4.6 - 6.5 %    Comment: Glycemic Control Guidelines for People with Diabetes:Non Diabetic:  <6%Goal of Therapy: <7%Additional Action Suggested:  >8%   CBC with Differential/Platelet     Status: None   Collection Time: 05/16/23 10:47 AM  Result Value Ref Range   WBC 5.5 4.0 - 10.5 K/uL   RBC 4.17 3.87 - 5.11 Mil/uL   Hemoglobin 12.5 12.0 - 15.0 g/dL   HCT 57.8 46.9 - 62.9 %   MCV 88.6 78.0 - 100.0 fl   MCHC 33.8 30.0 - 36.0 g/dL   RDW 52.8 41.3 - 24.4 %   Platelets 371.0 150.0 - 400.0 K/uL   Neutrophils Relative % 58.5 43.0 - 77.0 %   Lymphocytes Relative 33.5  12.0 - 46.0 %   Monocytes Relative 5.4 3.0 - 12.0 %   Eosinophils Relative 2.3 0.0 - 5.0 %   Basophils Relative 0.3 0.0 - 3.0 %   Neutro Abs 3.2 1.4 - 7.7 K/uL   Lymphs Abs 1.8 0.7 - 4.0 K/uL   Monocytes Absolute 0.3 0.1 - 1.0 K/uL   Eosinophils Absolute 0.1 0.0 - 0.7 K/uL   Basophils Absolute 0.0 0.0 - 0.1 K/uL  Comprehensive metabolic panel     Status: None   Collection Time: 05/16/23 10:47 AM  Result Value Ref Range   Sodium 139 135 - 145 mEq/L   Potassium 3.9 3.5 - 5.1 mEq/L   Chloride 104 96 - 112 mEq/L   CO2 24 19 - 32 mEq/L   Glucose, Bld 92 70 - 99 mg/dL   BUN 10 6 - 23 mg/dL   Creatinine, Ser 1.61 0.40 - 1.20 mg/dL   Total Bilirubin 0.4 0.2 - 1.2 mg/dL   Alkaline Phosphatase 93 39 - 117 U/L   AST 16 0 - 37 U/L   ALT 15 0 - 35 U/L   Total Protein 7.0 6.0 - 8.3 g/dL   Albumin 4.3 3.5 - 5.2 g/dL   GFR 096.04 >54.09 mL/min    Comment: Calculated using the  CKD-EPI Creatinine Equation (2021)   Calcium 9.5 8.4 - 10.5 mg/dL  Hepatitis B core antibody, total     Status: None   Collection Time: 05/16/23 10:47 AM  Result Value Ref Range   Hep B Core Total Ab NON-REACTIVE NON-REACTIVE    Comment: . For additional information, please refer to  http://education.[facility].com/faq/FAQ202  (This link is being provided for informational/ educational purposes only.) .   Hepatitis B surface antibody,qualitative     Status: None   Collection Time: 05/16/23 10:47 AM  Result Value Ref Range   Hep B S Ab NON-REACTIVE NON-REACTIVE  Hepatitis B surface antigen     Status: None   Collection Time: 05/16/23 10:47 AM  Result Value Ref Range   Hepatitis B Surface Ag NON-REACTIVE NON-REACTIVE    Comment: . For additional information, please refer to  http://education.[facility].com/faq/FAQ202  (This link is being provided for informational/ educational purposes only.) .   HCV Ab w Reflex to Quant PCR     Status: None   Collection Time: 05/16/23 10:47 AM  Result Value Ref Range   HCV Ab Non Reactive Non Reactive  HIV Antibody (routine testing w rflx)     Status: None   Collection Time: 05/16/23 10:47 AM  Result Value Ref Range   HIV 1&2 Ab, 4th Generation NON-REACTIVE NON-REACTIVE    Comment: HIV-1 antigen and HIV-1/HIV-2 antibodies were not detected. There is no laboratory evidence of HIV infection. Marland Kitchen PLEASE NOTE: This information has been disclosed to you from records whose confidentiality may be protected by state law.  If your state requires such protection, then the state law prohibits you from making any further disclosure of the information without the specific written consent of the person to whom it pertains, or as otherwise permitted by law. A general authorization for the release of medical or other information is NOT sufficient for this purpose. . For additional information please refer  to http://education.[facility].com/faq/FAQ106 (This link is being provided for informational/ educational purposes only.) . Marland Kitchen The performance of this assay has not been clinically validated in patients less than 13 years old. .   RPR     Status: None   Collection Time: 05/16/23 10:47 AM  Result Value Ref Range   RPR Ser Ql NON-REACTIVE NON-REACTIVE    Comment: . No laboratory evidence of syphilis. If recent exposure is suspected, submit a new sample in 2-4 weeks. .   B12 and Folate Panel     Status: Abnormal   Collection Time: 05/16/23 10:47 AM  Result Value Ref Range   Vitamin B-12 987 (H) 211 - 911 pg/mL   Folate 13.4 >5.9 ng/mL  Iron, TIBC and Ferritin Panel     Status: Abnormal   Collection Time: 05/16/23 10:47 AM  Result Value Ref Range   Iron 64 40 - 190 mcg/dL   TIBC 818 (H) 299 - 371 mcg/dL (calc)   %SAT 13 (L) 16 - 45 % (calc)   Ferritin 16 16 - 154 ng/mL  Lyme Disease Serology w/Reflex     Status: None   Collection Time: 05/16/23 10:47 AM  Result Value Ref Range   Lyme Total Antibody EIA Negative Negative    Comment: Lyme antibodies not detected. Reflex testing is not indicated. No laboratory evidence of infection with B. burgdorferi (Lyme disease). Negative results may occur in patients recently infected (less than or equal to 14 days) with B. burgdorferi.  If recent infection is suspected, repeat testing on a new sample collected in 7 to 14 days is recommended.   Beta hCG quant (ref lab)     Status: None   Collection Time: 05/16/23 10:47 AM  Result Value Ref Range   hCG Quant <1 mIU/mL    Comment:                      Female (Non-pregnant)    0 -     5                             (Postmenopausal)  0 -     8                      Female (Pregnant)                      Weeks of Gestation                              3                6 -    71                              4               10 -   750                              5              217 -   7138                              6              158 - [phone]  7             [id number] [phone]                              8            32065 -[phone]                              9            63803 -[phone]                             10            46509 -[id number]                             12            27832 -[id number]                             14            13950 - 62530                             15            12039 - 70971                             16             9040 - 56451                             17             [phone] Roche E CLIA methodology   Sedimentation rate     Status: Abnormal   Collection Time: 05/16/23 10:47 AM  Result Value Ref Range   Sed Rate 27 (H) 0 - 20 mm/hr  CRP High sensitivity     Status: Abnormal   Collection Time: 05/16/23 10:47 AM  Result Value Ref Range   CRP, High Sensitivity 11.310 (H) 0.000 - 5.000 mg/L    Comment: Note:  An elevated hs-CRP (>5 mg/L) should be repeated after 2 weeks to rule out recent infection or trauma.  ANA w/Reflex     Status: None   Collection Time: 05/16/23 10:47 AM  Result Value Ref Range   Anti Nuclear Antibody (ANA) Negative Negative  Rheumatoid Factor     Status: None   Collection Time: 05/16/23 10:47 AM  Result Value Ref Range   Rheumatoid fact SerPl-aCnc <10 <14 IU/mL  Cyclic citrul peptide antibody, IgG     Status: None   Collection Time: 05/16/23 10:47 AM  Result Value Ref Range   Cyclic Citrullin Peptide Ab <56 UNITS    Comment: Reference Range Negative:            <  20 Weak Positive:       20-39 Moderate Positive:   40-59 Strong Positive:     >59 .   Urinalysis, Routine w reflex microscopic     Status: Abnormal   Collection Time: 05/16/23 10:47 AM  Result Value Ref Range   Color, Urine YELLOW Yellow;Lt. Yellow;Straw;Dark Yellow;Amber;Green;Red;Brown   APPearance CLEAR Clear;Turbid;Slightly Cloudy;Cloudy   Specific  Gravity, Urine >=1.030 (A) 1.000 - 1.030   pH 6.0 5.0 - 8.0   Total Protein, Urine NEGATIVE Negative   Urine Glucose NEGATIVE Negative   Ketones, ur NEGATIVE Negative   Bilirubin Urine NEGATIVE Negative   Hgb urine dipstick NEGATIVE Negative   Urobilinogen, UA 0.2 0.0 - 1.0   Leukocytes,Ua NEGATIVE Negative   Nitrite NEGATIVE Negative   WBC, UA 0-2/hpf 0-2/hpf   RBC / HPF 0-2/hpf 0-2/hpf   Mucus, UA Presence of (A) None   Squamous Epithelial / HPF Many(>10/hpf) (A) Rare(0-4/hpf)   Bacteria, UA Rare(<10/hpf) (A) None  Interpretation:     Status: None   Collection Time: 05/16/23 10:47 AM  Result Value Ref Range   HCV Interp 1: Comment     Comment: Not infected with HCV unless early or acute infection is suspected (which may be delayed in an immunocompromised individual), or other evidence exists to indicate HCV infection.   TSH Rfx on Abnormal to Free T4     Status: None   Collection Time: 05/16/23 10:48 AM  Result Value Ref Range   TSH 0.992 0.450 - 4.500 uIU/mL        Garner Nash, MD, MS

## 2023-06-13 NOTE — Assessment & Plan Note (Signed)
Reports improvement in daytime fatigue since starting ferrous sulfate supplementation one month ago. Iron studies previously showed low iron levels without anemia. She is taking ferrous sulfate 325 mg daily without side effects.  Plan: Repeat Iron Studies and CBC Vitamin D Level: To evaluate for deficiency affecting fatigue and mood. Continue Ferrous Sulfate 325 mg daily. Maintain balanced diet rich in iron. Continue sleep hygiene practices. Return in six months or sooner if symptoms worsen. Monitor fatigue levels and record in symptom diary.

## 2023-06-13 NOTE — Assessment & Plan Note (Addendum)
Continues to report joint pains in hands and left hip, with pain exacerbated by cold weather. Imaging studies of hands and left hip were unremarkable except for minor degenerative changes at the pubic symphysis. Laboratory investigations showed elevated CRP at 11, but remaining workup negative including rheumatoid factor and autoimmune panel were negative.  Plan:  Follow up with rheumatology

## 2023-06-14 MED ORDER — IRON (FERROUS SULFATE) 325 (65 FE) MG PO TABS: 325.0000 mg | ORAL_TABLET | Freq: Two times a day (BID) | ORAL | 11 refills | Status: DC

## 2023-06-14 MED ORDER — VITAMIN C 250 MG PO TABS: 250.0000 mg | ORAL_TABLET | Freq: Two times a day (BID) | ORAL | 11 refills | Status: DC

## 2023-06-14 NOTE — Addendum Note (Signed)
Addended by: Fanny Bien B on: 06/14/2023 12:54 PM   Modules accepted: Orders

## 2023-06-15 ENCOUNTER — Telehealth: Payer: Self-pay

## 2023-06-15 NOTE — Telephone Encounter (Signed)
 Patient reviewed results via mychart.

## 2023-06-15 NOTE — Telephone Encounter (Signed)
No faxes were sent from our office from me. Reviewed chart and didn't find any documentation.

## 2023-06-15 NOTE — Telephone Encounter (Signed)
-----   Message from Julie [name] sent at 06/14/2023 12:54 PM EST ----- Please call and inform lab results, recommendations and to follow-up in 4 weeks for an office visit as described in my results note.

## 2023-06-16 LAB — IRON,TIBC AND FERRITIN PANEL
%SAT: 15 % — ABNORMAL LOW (ref 16–45)
Ferritin: 12 ng/mL — ABNORMAL LOW (ref 16–154)
Iron: 72 ug/dL (ref 40–190)
TIBC: 473 ug/dL — ABNORMAL HIGH (ref 250–450)

## 2023-06-16 LAB — VITAMIN D 1,25 DIHYDROXY
Vitamin D 1, 25 (OH)2 Total: 73 pg/mL — ABNORMAL HIGH (ref 18–72)
Vitamin D2 1, 25 (OH)2: <8 pg/mL
Vitamin D3 1, 25 (OH)2: 73 pg/mL

## 2023-06-20 DIAGNOSIS — F4312 Post-traumatic stress disorder, chronic: Secondary | ICD-10-CM | POA: Diagnosis not present

## 2023-06-20 DIAGNOSIS — F319 Bipolar disorder, unspecified: Secondary | ICD-10-CM | POA: Diagnosis not present

## 2023-07-10 ENCOUNTER — Telehealth: Payer: Medicaid Other | Admitting: Physician Assistant

## 2023-07-10 DIAGNOSIS — J020 Streptococcal pharyngitis: Secondary | ICD-10-CM | POA: Diagnosis not present

## 2023-07-10 MED ORDER — AMOXICILLIN 500 MG PO CAPS: 500.0000 mg | ORAL_CAPSULE | Freq: Two times a day (BID) | ORAL | 0 refills | Status: AC

## 2023-07-10 NOTE — Patient Instructions (Addendum)
 Julie [name], thank you for joining Delon Julie Dickinson, PA-C for today's virtual visit.  While this provider is not your primary care provider (PCP), if your PCP is located in our provider database this encounter information will be shared with them immediately following your visit.   A [facility] MyChart account gives you access to today's visit and all your visits, tests, and labs performed at [facility]  click here if you don't have a [name] account or go to [email or web address]  Consent: (Patient) Julie [name] provided verbal consent for this virtual visit at the beginning of the encounter.  Current Medications:  Current Outpatient Medications:    amoxicillin  (AMOXIL ) 500 MG capsule, Take 1 capsule (500 mg total) by mouth 2 (two) times daily for 10 days., Disp: 20 capsule, Rfl: 0   ibuprofen  (ADVIL ) 800 MG tablet, Take 1 tablet (800 mg total) by mouth every 8 (eight) hours as needed., Disp: 30 tablet, Rfl: 5   Iron , Ferrous Sulfate , 325 (65 Fe) MG TABS, Take 325 mg by mouth in the morning and at bedtime. Take with Vitamin C ., Disp: 30 tablet, Rfl: 11   lamoTRIgine  (LAMICTAL ) 100 MG tablet, Take 100 mg by mouth daily., Disp: , Rfl:    norethindrone-ethinyl estradiol  1/35 (ORTHO-NOVUM  1/35, 28,) tablet, Take 1 tablet by mouth daily., Disp: 28 tablet, Rfl: 0   QUEtiapine  (SEROQUEL ) 100 MG tablet, Take 150 mg by mouth at bedtime., Disp: , Rfl:    vitamin C  (ASCORBIC ACID) 250 MG tablet, Take 1 tablet (250 mg total) by mouth 2 (two) times daily., Disp: 60 tablet, Rfl: 11   Medications ordered in this encounter:  Meds ordered this encounter  Medications   amoxicillin  (AMOXIL ) 500 MG capsule    Sig: Take 1 capsule (500 mg total) by mouth 2 (two) times daily for 10 days.    Dispense:  20 capsule    Refill:  0    Supervising Provider:   BLAISE ALEENE KIDD [[id number]]     *If you need refills on other medications prior to your next appointment,  please contact your pharmacy*  Follow-Up: Call back or seek an in-person evaluation if the symptoms worsen or if the condition fails to improve as anticipated.  [facility] [phone]  Other Instructions  Strep Throat, Adult Strep throat is an infection in the throat that is caused by bacteria. It is common during the cold months of the year. It mostly affects children who are 63-15 years old. However, people of all ages can get it at any time of the year. This infection spreads from person to person (is contagious) through coughing, sneezing, or having close contact. Your health care provider may use other names to describe the infection. When strep throat affects the tonsils, it is called tonsillitis. When it affects the back of the throat, it is called pharyngitis. What are the causes? This condition is caused by the Streptococcus pyogenes bacteria. What increases the risk? You are more likely to develop this condition if: You care for school-age children, or are around school-age children. Children are more likely to get strep throat and may spread it to others. You spend time in crowded places where the infection can spread easily. You have close contact with someone who has strep throat. What are the signs or symptoms? Symptoms of this condition include: Fever or chills. Redness, swelling, or pain in the tonsils or throat. Pain or difficulty when swallowing. White or yellow spots on  the tonsils or throat. Tender glands in the neck and under the jaw. Bad smelling breath. Red rash all over the body. This is rare. How is this diagnosed? This condition is diagnosed by tests that check for the presence and the amount of bacteria that cause strep throat. They are: Rapid strep test. Your throat is swabbed and checked for the presence of bacteria. Results are usually ready in minutes. Throat culture test. Your throat is swabbed. The sample is placed in a cup that allows  infections to grow. Results are usually ready in 1 or 2 days. How is this treated? This condition may be treated with: Medicines that kill germs (antibiotics). Medicines that relieve pain or fever. These include: Ibuprofen  or acetaminophen . Aspirin, only for people who are over the age of 72. Throat lozenges. Throat sprays. Follow these instructions at home: Medicines  Take over-the-counter and prescription medicines only as told by your health care provider. Take your antibiotic medicine as told by your health care provider. Do not stop taking the antibiotic even if you start to feel better. Eating and drinking  If you have trouble swallowing, try eating soft foods until your sore throat feels better. Drink enough fluid to keep your urine pale yellow. To help relieve pain, you may have: Warm fluids, such as soup and tea. Cold fluids, such as frozen desserts or popsicles. General instructions Gargle with a salt-water mixture 3-4 times a day or as needed. To make a salt-water mixture, completely dissolve -1 tsp (3-6 g) of salt in 1 cup (237 mL) of warm water. Get plenty of rest. Stay home from work or school until you have been taking antibiotics for 24 hours. Do not use any products that contain nicotine or tobacco. These products include cigarettes, chewing tobacco, and vaping devices, such as e-cigarettes. If you need help quitting, ask your health care provider. It is up to you to get your test results. Ask your health care provider, or the department that is doing the test, when your results will be ready. Keep all follow-up visits. This is important. How is this prevented?  Do not share food, drinking cups, or personal items that could cause the infection to spread to other people. Wash your hands often with soap and water for at least 20 seconds. If soap and water are not available, use hand sanitizer. Make sure that all people in your house wash their hands well. Have family  members tested if they have a sore throat or fever. They may need an antibiotic if they have strep throat. Contact a health care provider if: You have swelling in your neck that keeps getting bigger. You develop a rash, cough, or earache. You cough up a thick mucus that is green, yellow-brown, or bloody. You have pain or discomfort that does not get better with medicine. Your symptoms seem to be getting worse. You have a fever. Get help right away if: You have new symptoms, such as vomiting, severe headache, stiff or painful neck, chest pain, or shortness of breath. You have severe throat pain, drooling, or changes in your voice. You have swelling of the neck, or the skin on the neck becomes red and tender. You have signs of dehydration, such as tiredness (fatigue), dry mouth, and decreased urination. You become increasingly sleepy, or you cannot wake up completely. Your joints become red or painful. These symptoms may represent a serious problem that is an emergency. Do not wait to see if the symptoms will go away.  Get medical help right away. Call your local emergency services (911 in the [place] Do not drive yourself to the hospital. Summary Strep throat is an infection in the throat that is caused by the Streptococcus pyogenes bacteria. This infection is spread from person to person (is contagious) through coughing, sneezing, or having close contact. Take your medicines, including antibiotics, as told by your health care provider. Do not stop taking the antibiotic even if you start to feel better. To prevent the spread of germs, wash your hands well with soap and water. Have others do the same. Do not share food, drinking cups, or personal items. Get help right away if you have new symptoms, such as vomiting, severe headache, stiff or painful neck, chest pain, or shortness of breath. This information is not intended to replace advice given to you by your health care provider. Make sure you  discuss any questions you have with your health care provider. Document Revised: 10/13/2020 Document Reviewed: 10/13/2020 Elsevier Patient Education  2024 Elsevier Inc.    If you have been instructed to have an in-person evaluation today at a local Urgent Care facility, please use the link below. It will take you to a list of all of our available [facility], including address, phone number and hours of operation. Please do not delay care.  [facility]  If you or a family member do not have a primary care provider, use the link below to schedule a visit and establish care. When you choose a [facility] primary care physician or advanced practice provider, you gain a long-term partner in health. Find a Primary Care Provider  Learn more about [facility] in-office and virtual care options: [facility] Get Care Now

## 2023-07-10 NOTE — Progress Notes (Signed)
 Virtual Visit Consent   Julie [name], you are scheduled for a virtual visit with a [facility] provider today. Just as with appointments in the office, your consent must be obtained to participate. Your consent will be active for this visit and any virtual visit you may have with one of our providers in the next 365 days. If you have a MyChart account, a copy of this consent can be sent to you electronically.  As this is a virtual visit, video technology does not allow for your provider to perform a traditional examination. This may limit your provider's ability to fully assess your condition. If your provider identifies any concerns that need to be evaluated in person or the need to arrange testing (such as labs, EKG, etc.), we will make arrangements to do so. Although advances in technology are sophisticated, we cannot ensure that it will always work on either your end or our end. If the connection with a video visit is poor, the visit may have to be switched to a telephone visit. With either a video or telephone visit, we are not always able to ensure that we have a secure connection.  By engaging in this virtual visit, you consent to the provision of healthcare and authorize for your insurance to be billed (if applicable) for the services provided during this visit. Depending on your insurance coverage, you may receive a charge related to this service.  I need to obtain your verbal consent now. Are you willing to proceed with your visit today? Julie [name] has provided verbal consent on 07/10/2023 for a virtual visit (video or telephone). Delon CHRISTELLA Dickinson, PA-C  Date: 07/10/2023 8:34 AM  Virtual Visit via Video Note   I, Delon CHRISTELLA Dickinson, connected with  Julie [name]  ([id number], 07-16-1997) on 07/10/23 at  8:30 AM EST by a video-enabled telemedicine application and verified that I am speaking with the correct person using two identifiers.  Location: Patient: Virtual Visit  Location Patient: Home Provider: Virtual Visit Location Provider: Home Office   I discussed the limitations of evaluation and management by telemedicine and the availability of in person appointments. The patient expressed understanding and agreed to proceed.    History of Present Illness: Julie [name] is a 28 y.o. who identifies as a female who was assigned female at birth, and is being seen today for sore throat, strep exposure.  HPI: Sore Throat  This is a new problem. The current episode started in the past 7 days (Symptoms started Saturday). The problem has been gradually worsening. There has been no fever. The pain is moderate. Associated symptoms include congestion, coughing (from swelling in throat and tonsil), headaches, swollen glands and trouble swallowing. Pertinent negatives include no diarrhea, drooling, ear discharge, ear pain, hoarse voice, plugged ear sensation or vomiting. She has had exposure to strep. Exposure to: daughter. Treatments tried: cough drops, hot liquids. The treatment provided no relief.     Problems:  Patient Active Problem List   Diagnosis Date Noted   Chronic fatigue 05/16/2023   Joint swelling 05/16/2023   Caregiver stress 05/16/2023   History of substance abuse (HCC) 05/16/2023   Elevated C-reactive protein (CRP) 05/16/2023   Elevated sed rate 05/16/2023   Nexplanon  in place 05/11/2020   Encounter for initial prescription of implantable subdermal contraceptive    Generalized anxiety disorder 05/16/2018   Obsessive compulsive disorder 05/16/2018   Bipolar I disorder, moderate, current or most recent episode manic, in partial remission, with anxious  distress (HCC) 09/10/2013   Acne 07/26/2011    Allergies: No Known Allergies Medications:  Current Outpatient Medications:    amoxicillin  (AMOXIL ) 500 MG capsule, Take 1 capsule (500 mg total) by mouth 2 (two) times daily for 10 days., Disp: 20 capsule, Rfl: 0   ibuprofen  (ADVIL ) 800 MG tablet,  Take 1 tablet (800 mg total) by mouth every 8 (eight) hours as needed., Disp: 30 tablet, Rfl: 5   Iron , Ferrous Sulfate , 325 (65 Fe) MG TABS, Take 325 mg by mouth in the morning and at bedtime. Take with Vitamin C ., Disp: 30 tablet, Rfl: 11   lamoTRIgine  (LAMICTAL ) 100 MG tablet, Take 100 mg by mouth daily., Disp: , Rfl:    norethindrone-ethinyl estradiol  1/35 (ORTHO-NOVUM  1/35, 28,) tablet, Take 1 tablet by mouth daily., Disp: 28 tablet, Rfl: 0   QUEtiapine  (SEROQUEL ) 100 MG tablet, Take 150 mg by mouth at bedtime., Disp: , Rfl:    vitamin C  (ASCORBIC ACID) 250 MG tablet, Take 1 tablet (250 mg total) by mouth 2 (two) times daily., Disp: 60 tablet, Rfl: 11  Observations/Objective: Patient is well-developed, well-nourished in no acute distress.  Resting comfortably at home.  Head is normocephalic, atraumatic.  No labored breathing.  Speech is clear and coherent with logical content.  Patient is alert and oriented at baseline.    Assessment and Plan: 1. Strep throat (Primary) - amoxicillin  (AMOXIL ) 500 MG capsule; Take 1 capsule (500 mg total) by mouth 2 (two) times daily for 10 days.  Dispense: 20 capsule; Refill: 0  - Suspect strep throat - Amoxicillin  prescribed - Tylenol  and Ibuprofen  alternating every 4 hours - Salt water gargles - Chloraseptic spray - Liquid and soft food diet - Push fluids - New toothbrush in 3 days - Seek in person evaluation if not improving or if symptoms worsen   Follow Up Instructions: I discussed the assessment and treatment plan with the patient. The patient was provided an opportunity to ask questions and all were answered. The patient agreed with the plan and demonstrated an understanding of the instructions.  A copy of instructions were sent to the patient via MyChart unless otherwise noted below.    The patient was advised to call back or seek an in-person evaluation if the symptoms worsen or if the condition fails to improve as anticipated.     Delon CHRISTELLA Dickinson, PA-C

## 2023-07-27 DIAGNOSIS — F4312 Post-traumatic stress disorder, chronic: Secondary | ICD-10-CM | POA: Diagnosis not present

## 2023-07-27 DIAGNOSIS — F319 Bipolar disorder, unspecified: Secondary | ICD-10-CM | POA: Diagnosis not present

## 2023-07-27 DIAGNOSIS — F4320 Adjustment disorder, unspecified: Secondary | ICD-10-CM | POA: Diagnosis not present

## 2023-08-01 DIAGNOSIS — F4312 Post-traumatic stress disorder, chronic: Secondary | ICD-10-CM | POA: Diagnosis not present

## 2023-08-01 DIAGNOSIS — F319 Bipolar disorder, unspecified: Secondary | ICD-10-CM | POA: Diagnosis not present

## 2023-08-01 DIAGNOSIS — F4320 Adjustment disorder, unspecified: Secondary | ICD-10-CM | POA: Diagnosis not present

## 2023-08-15 DIAGNOSIS — F4312 Post-traumatic stress disorder, chronic: Secondary | ICD-10-CM | POA: Diagnosis not present

## 2023-08-15 DIAGNOSIS — F4320 Adjustment disorder, unspecified: Secondary | ICD-10-CM | POA: Diagnosis not present

## 2023-08-15 DIAGNOSIS — F319 Bipolar disorder, unspecified: Secondary | ICD-10-CM | POA: Diagnosis not present

## 2023-08-22 DIAGNOSIS — F319 Bipolar disorder, unspecified: Secondary | ICD-10-CM | POA: Diagnosis not present

## 2023-08-22 DIAGNOSIS — F4312 Post-traumatic stress disorder, chronic: Secondary | ICD-10-CM | POA: Diagnosis not present

## 2023-08-22 DIAGNOSIS — F4320 Adjustment disorder, unspecified: Secondary | ICD-10-CM | POA: Diagnosis not present

## 2023-08-29 DIAGNOSIS — F319 Bipolar disorder, unspecified: Secondary | ICD-10-CM | POA: Diagnosis not present

## 2023-08-29 DIAGNOSIS — F4320 Adjustment disorder, unspecified: Secondary | ICD-10-CM | POA: Diagnosis not present

## 2023-08-29 DIAGNOSIS — F4312 Post-traumatic stress disorder, chronic: Secondary | ICD-10-CM | POA: Diagnosis not present

## 2023-09-11 DIAGNOSIS — F319 Bipolar disorder, unspecified: Secondary | ICD-10-CM | POA: Diagnosis not present

## 2023-09-11 DIAGNOSIS — F4312 Post-traumatic stress disorder, chronic: Secondary | ICD-10-CM | POA: Diagnosis not present

## 2023-09-12 DIAGNOSIS — F319 Bipolar disorder, unspecified: Secondary | ICD-10-CM | POA: Diagnosis not present

## 2023-09-12 DIAGNOSIS — F4312 Post-traumatic stress disorder, chronic: Secondary | ICD-10-CM | POA: Diagnosis not present

## 2023-09-12 DIAGNOSIS — F4320 Adjustment disorder, unspecified: Secondary | ICD-10-CM | POA: Diagnosis not present

## 2023-09-14 DIAGNOSIS — Z3041 Encounter for surveillance of contraceptive pills: Secondary | ICD-10-CM | POA: Diagnosis not present

## 2023-09-26 DIAGNOSIS — F4312 Post-traumatic stress disorder, chronic: Secondary | ICD-10-CM | POA: Diagnosis not present

## 2023-09-26 DIAGNOSIS — F319 Bipolar disorder, unspecified: Secondary | ICD-10-CM | POA: Diagnosis not present

## 2023-09-26 DIAGNOSIS — F4320 Adjustment disorder, unspecified: Secondary | ICD-10-CM | POA: Diagnosis not present

## 2023-10-10 DIAGNOSIS — F4320 Adjustment disorder, unspecified: Secondary | ICD-10-CM | POA: Diagnosis not present

## 2023-10-10 DIAGNOSIS — F319 Bipolar disorder, unspecified: Secondary | ICD-10-CM | POA: Diagnosis not present

## 2023-10-10 DIAGNOSIS — F4312 Post-traumatic stress disorder, chronic: Secondary | ICD-10-CM | POA: Diagnosis not present

## 2023-10-17 ENCOUNTER — Ambulatory Visit: Payer: Medicaid Other | Attending: Internal Medicine | Admitting: Internal Medicine

## 2023-10-17 ENCOUNTER — Encounter: Payer: Self-pay | Admitting: Internal Medicine

## 2023-10-17 VITALS — BP 112/78 | HR 111 | Resp 14 | Ht 65.5 in | Wt 144.0 lb

## 2023-10-17 DIAGNOSIS — R5382 Chronic fatigue, unspecified: Secondary | ICD-10-CM | POA: Diagnosis not present

## 2023-10-17 DIAGNOSIS — R7982 Elevated C-reactive protein (CRP): Secondary | ICD-10-CM

## 2023-10-17 DIAGNOSIS — R700 Elevated erythrocyte sedimentation rate: Secondary | ICD-10-CM

## 2023-10-17 DIAGNOSIS — R899 Unspecified abnormal finding in specimens from other organs, systems and tissues: Secondary | ICD-10-CM | POA: Diagnosis not present

## 2023-10-17 NOTE — Progress Notes (Signed)
 Office Visit Note  Patient: Julie [name]             Date of Birth: 1996/04/26           MRN: [id number]             PCP: Garnette Gunner, MD Referring: Garnette Gunner, MD Visit Date: 10/17/2023 Occupation: Social media assistant  Subjective:  New Patient (Initial Visit) (Patient states she has joint pain all over her body in different spots at different times. Patient states it feels like the pain takes turns. Patient states most of the pain is in her hands and hips. )    Discussed the use of AI scribe software for clinical note transcription with the patient, who gave verbal consent to proceed.  History of Present Illness   She is a 28 year old female who presents with joint pain, stiffness, and fatigue. She was referred by Dr. Janee Morn for evaluation of joint pain, stiffness, and abnormal test results.  She experiences joint pain and stiffness primarily in her hands and knees, which have worsened over the past year, occurring a couple of times a week. The symptoms are exacerbated in the winter and improve with warmer weather. Her knuckles become red and painful, but the redness resolves within two hours after taking ibuprofen. She takes three ibuprofen tablets twice a week, which alleviates the pain.  She also experiences significant fatigue, which she describes as 'so fatigued and exhausted all the time,' affecting her ability to work. Sometimes she cannot work due to the fatigue. She has been taking an iron supplement, ferrous sulfate, but it causes gastrointestinal discomfort even when taken with food. Despite this, she continues to take it as she was previously found to be iron deficient.  She underwent x-rays of her hands and hips, which were normal, and blood tests that showed elevated inflammatory markers. No frequent infections, but she had a sinus infection in September. She reports sleeping well most nights, though pain sometimes disrupts her sleep. She has lost 60  pounds over the past two years and has maintained her weight for the last year.  No previous joint injuries or significant illnesses. Her family history is notable for thyroid problems in her mother. 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     Labs reviewed 05/2023 ESR 27 CRP 11.3  Activities of Daily Living:  Patient reports morning stiffness for 1 hour.   Patient Reports nocturnal pain.  Difficulty dressing/grooming: Denies Difficulty climbing stairs: Denies Difficulty getting out of chair: Denies Difficulty using hands for taps, buttons, cutlery, and/or writing: Denies  Review of Systems  Constitutional:  Positive for fatigue.  HENT:  Negative for mouth sores and mouth dryness.   Eyes:  Negative for dryness.  Respiratory:  Negative for shortness of breath.   Cardiovascular:  Negative for chest pain and palpitations.  Gastrointestinal:  Negative for blood in stool, constipation and diarrhea.  Endocrine: Negative for increased urination.  Genitourinary:  Negative for involuntary urination.  Musculoskeletal:  Positive for joint pain, joint pain, joint swelling, myalgias, muscle weakness, morning stiffness, muscle tenderness and myalgias. Negative for gait problem.  Skin:  Negative for color change, rash, hair loss and sensitivity to sunlight.  Allergic/Immunologic: Negative for susceptible to infections.  Neurological:  Negative for dizziness and headaches.  Hematological:  Negative for swollen glands.  Psychiatric/Behavioral:  Positive for sleep disturbance. Negative for depressed mood. The patient is not nervous/anxious.  PMFS History:  Patient Active Problem List   Diagnosis Date Noted   Chronic fatigue 05/16/2023   Joint swelling 05/16/2023   Caregiver stress 05/16/2023   History of substance abuse (HCC) 05/16/2023   Elevated C-reactive protein (CRP) 05/16/2023   Elevated sed rate 05/16/2023   Nexplanon in place  05/11/2020   Encounter for initial prescription of implantable subdermal contraceptive    Generalized anxiety disorder 05/16/2018   Obsessive compulsive disorder 05/16/2018   Bipolar I disorder, moderate, current or most recent episode manic, in partial remission, with anxious distress (HCC) 09/10/2013   Acne 07/26/2011    Past Medical History:  Diagnosis Date   Anxiety    Asthma    Depression    Hypertension     Family History  Problem Relation Age of Onset   Healthy Mother    Bipolar disorder Father    Alcohol abuse Paternal Grandmother    Past Surgical History:  Procedure Laterality Date   INSERTION OF IMPLANON ROD  04/13/2020       NO PAST SURGERIES     Social History   Social History Narrative   Not on file   Immunization History  Administered Date(s) Administered   Influenza, Seasonal, Injecte, Preservative Fre 05/16/2023   Influenza,inj,Quad PF,6+ Mos 04/14/2020   Influenza-Unspecified 06/03/2018   Tdap 02/19/2008, 07/05/2018, 01/21/2020     Objective: Vital Signs: BP 112/78 (BP Location: Right Arm, Patient Position: Sitting, Cuff Size: Normal)   Pulse (!) 111   Resp 14   Ht 5' 5.5" (1.664 m)   Wt 144 lb (65.3 kg)   BMI 23.60 kg/m    Physical Exam HENT:     Mouth/Throat:     Mouth: Mucous membranes are moist.     Pharynx: Oropharynx is clear.  Eyes:     Conjunctiva/sclera: Conjunctivae normal.  Cardiovascular:     Rate and Rhythm: Regular rhythm. Tachycardia present.  Pulmonary:     Effort: Pulmonary effort is normal.     Breath sounds: Normal breath sounds.  Musculoskeletal:     Right lower leg: No edema.     Left lower leg: No edema.  Lymphadenopathy:     Cervical: No cervical adenopathy.  Skin:    General: Skin is warm and dry.     Findings: No rash.  Neurological:     Mental Status: She is alert.  Psychiatric:        Mood and Affect: Mood normal.      Musculoskeletal Exam:  Shoulders full ROM no tenderness or swelling Elbows full  ROM no tenderness or swelling Wrists full ROM no tenderness or swelling Fingers mild hyperextensibility of MCPs >90 degrees, thumb readily touches forearm Knees full ROM no tenderness or swelling Ankles full ROM no tenderness or swelling    Investigation: No additional findings.  Imaging: No results found.  Recent Labs: Lab Results  Component Value Date   WBC 6.5 06/13/2023   HGB 11.9 (L) 06/13/2023   PLT 304.0 06/13/2023   NA 139 05/16/2023   K 3.9 05/16/2023   CL 104 05/16/2023   CO2 24 05/16/2023   GLUCOSE 92 05/16/2023   BUN 10 05/16/2023   CREATININE 0.63 05/16/2023   BILITOT 0.4 05/16/2023   ALKPHOS 93 05/16/2023   AST 16 05/16/2023   ALT 15 05/16/2023   PROT 7.0 05/16/2023   ALBUMIN 4.3 05/16/2023   CALCIUM 9.5 05/16/2023   GFRAA >60 03/22/2020    Speciality Comments: No specialty comments available.  Procedures:  No  procedures performed Allergies: Patient has no known allergies.   Assessment / Plan:     Visit Diagnoses: Elevated sed rate - Plan: Sedimentation rate, C-reactive protein Elevated C-reactive protein (CRP) Chronic joint pain and stiffness, primarily in hands, knees, and hips. Symptoms worsen in winter, relieved by ibuprofen. Currently managing symptoms with what sounds like ibuprofen 600 mg 2-3 times per week which is probably low risk. Normal X-rays reviewed personally, osteitis condensans changes. Possible hypermobility contributing to pain. Noted redness in knuckles during pain episodes. Hypermobility increases risk of sprains, strains, and overuse injuries. - Recheck inflammatory markers. - Evaluate joint mobility and hypermobility. - Continue ibuprofen as needed for pain management.  Chronic fatigue Chronic fatigue affecting daily life. Possible iron deficiency and elevated inflammatory markers. Occasional sleep disturbances due to joint pain. Current ferrous sulfate treatment causing GI side effects. Alternatives include ferrous  bisglycinate or iron infusion if oral supplementation is ineffective. - Recheck iron levels. - Consider trying ferrous bisglycinate if current iron supplement causes continued GI side effects but working  Abnormal laboratory test result - Plan: IgG, IgA, IgM, Iron, TIBC and Ferritin Panel Unclear cause for elevated inflammatory markers and without specific antibody markers on previous testing. - Checking quantitative immunoglobulins for other markers of immune activation.   Orders: Orders Placed This Encounter  Procedures   Sedimentation rate   C-reactive protein   IgG, IgA, IgM   Iron, TIBC and Ferritin Panel   No orders of the defined types were placed in this encounter.    Follow-Up Instructions: Return if symptoms worsen or fail to improve.   Matt Song, MD  Note - This record has been created using [employer].  Chart creation errors have been sought, but may not always  have been located. Such creation errors do not reflect on  the standard of medical care.

## 2023-10-18 LAB — IRON,TIBC AND FERRITIN PANEL
%SAT: 8 % — ABNORMAL LOW (ref 16–45)
Ferritin: 17 ng/mL (ref 16–154)
Iron: 37 ug/dL — ABNORMAL LOW (ref 40–190)
TIBC: 468 ug/dL — ABNORMAL HIGH (ref 250–450)

## 2023-10-18 LAB — SEDIMENTATION RATE: Sed Rate: 17 mm/h (ref 0–20)

## 2023-10-18 LAB — C-REACTIVE PROTEIN: CRP: 26.5 mg/L — ABNORMAL HIGH (ref <8.0)

## 2023-10-18 LAB — IGG, IGA, IGM
IgG (Immunoglobin G), Serum: 938 mg/dL (ref 600–1640)
IgM, Serum: 75 mg/dL (ref 50–300)
Immunoglobulin A: 126 mg/dL (ref 47–310)

## 2023-10-23 ENCOUNTER — Encounter: Payer: Self-pay | Admitting: Internal Medicine

## 2023-10-24 DIAGNOSIS — F4312 Post-traumatic stress disorder, chronic: Secondary | ICD-10-CM | POA: Diagnosis not present

## 2023-10-24 DIAGNOSIS — F319 Bipolar disorder, unspecified: Secondary | ICD-10-CM | POA: Diagnosis not present

## 2023-10-24 DIAGNOSIS — F4320 Adjustment disorder, unspecified: Secondary | ICD-10-CM | POA: Diagnosis not present

## 2023-10-27 NOTE — Telephone Encounter (Signed)
 Patient contacted the office requesting her lab results. Patient advised out office is closed. Patient advised I would send the message to Dr. Rodell Citrin and it may be Monday before she receives a response. Patient expressed understanding.

## 2023-10-31 NOTE — Telephone Encounter (Signed)
 Patient contacted the office again in regards to her lab results. Please advise.

## 2023-11-06 ENCOUNTER — Telehealth: Payer: Self-pay | Admitting: Internal Medicine

## 2023-11-06 DIAGNOSIS — R899 Unspecified abnormal finding in specimens from other organs, systems and tissues: Secondary | ICD-10-CM

## 2023-11-06 DIAGNOSIS — R5382 Chronic fatigue, unspecified: Secondary | ICD-10-CM

## 2023-11-06 DIAGNOSIS — D509 Iron deficiency anemia, unspecified: Secondary | ICD-10-CM

## 2023-11-06 NOTE — Telephone Encounter (Signed)
 Pt contacted the office again stating she would like if someone would call her back with lab results. Pt stated she's been trying to get these results but has heard nothing. Pt would like a call back.

## 2023-11-07 DIAGNOSIS — F4312 Post-traumatic stress disorder, chronic: Secondary | ICD-10-CM | POA: Diagnosis not present

## 2023-11-07 DIAGNOSIS — F319 Bipolar disorder, unspecified: Secondary | ICD-10-CM | POA: Diagnosis not present

## 2023-11-07 DIAGNOSIS — F4320 Adjustment disorder, unspecified: Secondary | ICD-10-CM | POA: Diagnosis not present

## 2023-11-13 ENCOUNTER — Emergency Department (HOSPITAL_COMMUNITY)

## 2023-11-13 ENCOUNTER — Emergency Department (HOSPITAL_BASED_OUTPATIENT_CLINIC_OR_DEPARTMENT_OTHER)

## 2023-11-13 ENCOUNTER — Emergency Department (HOSPITAL_BASED_OUTPATIENT_CLINIC_OR_DEPARTMENT_OTHER): Admission: EM | Admit: 2023-11-13 | Discharge: 2023-11-14 | Disposition: A | Discharge status: Home / Self Care

## 2023-11-13 ENCOUNTER — Encounter (HOSPITAL_BASED_OUTPATIENT_CLINIC_OR_DEPARTMENT_OTHER): Payer: Self-pay

## 2023-11-13 ENCOUNTER — Other Ambulatory Visit: Payer: Self-pay

## 2023-11-13 DIAGNOSIS — R202 Paresthesia of skin: Secondary | ICD-10-CM | POA: Insufficient documentation

## 2023-11-13 DIAGNOSIS — R200 Anesthesia of skin: Secondary | ICD-10-CM | POA: Diagnosis not present

## 2023-11-13 DIAGNOSIS — G459 Transient cerebral ischemic attack, unspecified: Secondary | ICD-10-CM | POA: Diagnosis not present

## 2023-11-13 DIAGNOSIS — R29818 Other symptoms and signs involving the nervous system: Secondary | ICD-10-CM | POA: Diagnosis not present

## 2023-11-13 LAB — COMPREHENSIVE METABOLIC PANEL WITH GFR
ALT: 16 U/L (ref 0–44)
AST: 17 U/L (ref 15–41)
Albumin: 4.4 g/dL (ref 3.5–5.0)
Alkaline Phosphatase: 61 U/L (ref 38–126)
Anion gap: 12 (ref 5–15)
BUN: 9 mg/dL (ref 6–20)
CO2: 23 mmol/L (ref 22–32)
Calcium: 9.8 mg/dL (ref 8.9–10.3)
Chloride: 104 mmol/L (ref 98–111)
Creatinine, Ser: 0.68 mg/dL (ref 0.44–1.00)
GFR, Estimated: >60 mL/min (ref >60)
Glucose, Bld: 96 mg/dL (ref 70–99)
Potassium: 4.4 mmol/L (ref 3.5–5.1)
Sodium: 139 mmol/L (ref 135–145)
Total Bilirubin: 0.3 mg/dL (ref 0.0–1.2)
Total Protein: 7.2 g/dL (ref 6.5–8.1)

## 2023-11-13 LAB — DIFFERENTIAL
Abs Immature Granulocytes: 0.01 10*3/uL (ref 0.00–0.07)
Basophils Absolute: 0 10*3/uL (ref 0.0–0.1)
Basophils Relative: 1 %
Eosinophils Absolute: 0.1 10*3/uL (ref 0.0–0.5)
Eosinophils Relative: 2 %
Immature Granulocytes: 0 %
Lymphocytes Relative: 47 %
Lymphs Abs: 2.5 10*3/uL (ref 0.7–4.0)
Monocytes Absolute: 0.4 10*3/uL (ref 0.1–1.0)
Monocytes Relative: 7 %
Neutro Abs: 2.3 10*3/uL (ref 1.7–7.7)
Neutrophils Relative %: 43 %

## 2023-11-13 LAB — URINE DRUG SCREEN
Amphetamines: NOT DETECTED
Barbiturates: NOT DETECTED
Benzodiazepines: NOT DETECTED
Cocaine: NOT DETECTED
Fentanyl: NOT DETECTED
Methadone Scn, Ur: NOT DETECTED
Opiates: NOT DETECTED
Tetrahydrocannabinol: NOT DETECTED

## 2023-11-13 LAB — CBC
HCT: 36.3 % (ref 36.0–46.0)
Hemoglobin: 12 g/dL (ref 12.0–15.0)
MCH: 29.1 pg (ref 26.0–34.0)
MCHC: 33.1 g/dL (ref 30.0–36.0)
MCV: 88.1 fL (ref 80.0–100.0)
Platelets: 290 10*3/uL (ref 150–400)
RBC: 4.12 MIL/uL (ref 3.87–5.11)
RDW: 12.3 % (ref 11.5–15.5)
WBC: 5.3 10*3/uL (ref 4.0–10.5)
nRBC: 0 % (ref 0.0–0.2)

## 2023-11-13 LAB — CBG MONITORING, ED: Glucose-Capillary: 100 mg/dL — ABNORMAL HIGH (ref 70–99)

## 2023-11-13 LAB — PROTIME-INR
INR: 1 (ref 0.8–1.2)
Prothrombin Time: 12.9 s (ref 11.4–15.2)

## 2023-11-13 LAB — PREGNANCY, URINE: Preg Test, Ur: NEGATIVE

## 2023-11-13 LAB — APTT: aPTT: 25 s (ref 24–36)

## 2023-11-13 LAB — ETHANOL: Alcohol, Ethyl (B): <15 mg/dL (ref <15)

## 2023-11-13 MED ORDER — GADOBUTROL 1 MMOL/ML IV SOLN
6.0000 mL | Freq: Once | INTRAVENOUS | Status: AC | PRN
  Administered 2023-11-13: 6 mL via INTRAVENOUS

## 2023-11-13 NOTE — ED Notes (Signed)
[facility] here to get pt 

## 2023-11-13 NOTE — ED Notes (Signed)
 Pt passed her stroke swallow screen

## 2023-11-13 NOTE — ED Notes (Signed)
 Pt made aware by dr  that she is going to [facility] for MRI ,  I explained transport to her, pt states she doesn't want her mom back  in room with her

## 2023-11-13 NOTE — Consult Note (Addendum)
 [facility] TeleNeurology Consult Services    Date of Service:  11/13/2023     Metrics: Last Known Well: 1500 Symptoms: As per HPI.  Patient is not a candidate for thrombolytic. Symptoms too mild to treat.   Location of the provider: [facility]  Location of the patient: [facility] Pre-Morbid Modified Rankin Scale: 0 Time Code Stroke Page received:  4:43 PM Time neurologist arrived:  4:50 PM  This consult was provided via telemedicine with 2-way video and audio communication. The patient/family was informed that care would be provided in this way and agreed to receive care in this manner.   ED Physician notified of diagnostic impression and management plan after completion of NIHSS.   Assessment: 28 year old female presenting with acute onset of left sided sensory numbness  - Exam reveals mild LUE sensory numbness. NIHSS 1.  - BMP and CBC normal. Glucose 100. Coags normal. Pregnancy test negative.  - ANA negative in 05/2023.  - UDS negative - Stroke risk factors: OCP use. Prior elevated CRPs with recent CRP of 26.5.  - DDx for her presentation includes TIA and acute demyelinating event     Recommendations: - UDS - MRI brain with and without contrast - Repeat CRP - RPR - ESR - Hypercoagulable panel - Further recommendations after MRI brain       ------------------------------------------------------------------------------   History of Present Illness: 28 year old female with a PMHx as documented below (reviewed) who presents to the [facility] ED after acute onset of numbness to her left face, arm and leg. She was washing her child in the bathtub and upon taking the child out, the patient experienced acute onset of lightheadedness (states she felt like she was going to pass out) and dizziness. Slightly afterwards, the patient felt sudden onset of numbness to her entire left side, without weakness. She denies having had any other neurological  symptoms, including no headache. She denies any sharp neck movements. She presented to the ED where a Code Stroke was called. BP on presentation was 134/95, HR 96 and CBG 100. Her symptoms are now mostly resolved, with mild left upper extremity residual weakness only. She is not on any blood thinners. She does have a history of migraine headache, but no prior episode of complicated migraine or visual aura. She has never experienced any neurological symptoms previously.      Past Medical History: Past Medical History:  Diagnosis Date   Anxiety    Asthma    Depression    Hypertension       Past Surgical History: Past Surgical History:  Procedure Laterality Date   INSERTION OF IMPLANON  ROD  04/13/2020       NO PAST SURGERIES       Medications:  No current facility-administered medications on file prior to encounter.   Current Outpatient Medications on File Prior to Encounter  Medication Sig Dispense Refill   ibuprofen  (ADVIL ) 800 MG tablet Take 1 tablet (800 mg total) by mouth every 8 (eight) hours as needed. 30 tablet 5   Iron , Ferrous Sulfate , 325 (65 Fe) MG TABS Take 325 mg by mouth in the morning and at bedtime. Take with Vitamin C . 30 tablet 11   KELNOR  1/35 1-35 MG-MCG tablet Take 1 tablet by mouth daily.     lamoTRIgine  (LAMICTAL ) 100 MG tablet Take 100 mg by mouth daily.     norethindrone-ethinyl estradiol  1/35 (ORTHO-NOVUM  1/35, 28,) tablet Take 1 tablet by mouth daily. (Patient not taking:  Reported on 10/17/2023) 28 tablet 0   QUEtiapine  (SEROQUEL ) 100 MG tablet Take 150 mg by mouth at bedtime. (Patient not taking: Reported on 10/17/2023)     QUEtiapine  (SEROQUEL ) 300 MG tablet Take 300 mg by mouth at bedtime.           Social History: Previous substance abuse. None currently No EtOH use   Family History:  Reviewed in Epic   ROS: As per HPI    Anticoagulant use:  None   Antiplatelet use: None   Examination:      1A: Level of Consciousness - 0 1B: Ask Month  and Age - 0 1C: Blink Eyes & Squeeze Hands - 0 2: Test Horizontal Extraocular Movements - 0 3: Test Visual Fields - 0 4: Test Facial Palsy (Use Grimace if Obtunded) - 0 5A: Test Left Arm Motor Drift - 0 5B: Test Right Arm Motor Drift - 0 6A: Test Left Leg Motor Drift - 0 6B: Test Right Leg Motor Drift - 0 7: Test Limb Ataxia (FNF/Heel-Shin) - 0 8: Test Sensation -  1 (mild LUE numbness proximally and distally). No face or leg numbness.  9: Test Language/Aphasia - 0 10: Test Dysarthria - 0 11: Test Extinction/Inattention - 0   NIHSS Score: 1     Patient/Family was informed the Neurology Consult would occur via TeleHealth consult by way of interactive audio and video telecommunications and consented to receiving care in this manner.   Patient is being evaluated for possible acute neurologic impairment and high pretest probability of imminent or life-threatening deterioration. I spent total of 40 minutes providing care to this patient, including time for face to face visit via telemedicine, review of medical records, imaging studies and discussion of findings with providers, the patient and/or family.   Electronically signed: Dr. Achilles Neville

## 2023-11-13 NOTE — ED Notes (Addendum)
 Pt arrived from [facility] for MRI to r/o CVA - MRI was called

## 2023-11-13 NOTE — ED Notes (Signed)
 ED Provider at bedside.

## 2023-11-13 NOTE — Progress Notes (Signed)
 Telestroke cart was activated at 1640. Per treatment team, pt's LKW was at 1500 with c/o L sided numbness and dizziness. Dr. Charlee Conine, EDP assessed pt prior to cart was activated. Pt transported to CT at 1643 and returned to room at 1709. TSMD was paged for code stroke at 1641. TSRN called Dr. Lindzen at [phone]. Dr. Lindzen, TSMD appeared on telestroke cart at 1650 to assess the patient. Based on TSMD's assessment, pt does not meet criteria for emergent interventions at this time 2/2 symptoms too mild. TSMD to f/u with EDP regarding recommendations. No further needs from Telestroke RN at this time. Telestroke cart disconnected at 1711.    mRS-0

## 2023-11-13 NOTE — ED Provider Notes (Signed)
 Patient was initially evaluated by Dr. Charlee Conine at [facility] . Please see prior provider note for more detail.   Briefly: Patient is 28 y.o.   DDX: concern for stroke, demyelinating disease, neurological disorder, dehydration, cervical radiculopathy  Plan: Disposition per results of MR imaging and patient condition. Neurology consultation may be needed if MR results appear abnormal.  Physical Exam  BP 122/88   Pulse 77   Temp 99.5 F (37.5 C)   Resp 14   Ht 5\' 5"  (1.651 m)   Wt 63.5 kg   LMP  (LMP Unknown)   SpO2 100%   BMI 23.30 kg/m   Physical Exam Vitals and nursing note reviewed.  Constitutional:      General: She is not in acute distress.    Appearance: Normal appearance. She is not ill-appearing.  HENT:     Head: Normocephalic and atraumatic.  Cardiovascular:     Rate and Rhythm: Normal rate and regular rhythm.  Pulmonary:     Effort: Pulmonary effort is normal. No respiratory distress.     Breath sounds: Normal breath sounds. No wheezing.  Abdominal:     General: Abdomen is flat. Bowel sounds are normal.     Tenderness: There is no abdominal tenderness. There is no guarding.  Neurological:     Mental Status: She is alert and oriented to person, place, and time.     Sensory: Sensory deficit present.     Motor: No weakness.     Comments: NIH 1 due to altered sensation to the left upper extremity. She reports that numbness is present in the wrist/hand and has been improving since initially being seen prior to transfer for MR imaging.     Procedures  Procedures  ED Course / MDM    Medical Decision Making Amount and/or Complexity of Data Reviewed Labs: ordered. Radiology: ordered.  Risk Prescription drug management.   ***

## 2023-11-13 NOTE — ED Triage Notes (Signed)
 Pt arrives to ED ambulatory with some numbness to left arm. Pt states that she was at home today getting her son out of a hot bath and felt that she was going to pass out states that she then started having numbness in her left arm and called EMS. Reports numbness has gotten better since then but has not gone away.

## 2023-11-13 NOTE — ED Provider Notes (Signed)
 [place] [facility] AT [facility] Provider Note   CSN: [id number] Arrival date & time: 11/13/23  1557     History  Chief Complaint  Patient presents with   Numbness   Code Stroke    Julie [name] is a 28 y.o. female.  27 year old female with past medical history of anemia presenting to the emergency department today with left-sided numbness.  She states this started at 3 PM.  She states that this started when she was picking up her child.  She did become very lightheaded.  She states that she started having numbness in her left arm and left leg.  She was home alone is unsure about any speech difficulties.  Denies any associated headache.  She states that the left leg numbness has improved.  Left arm numbness is persistent.  She denies any associated chest pain.  She came to the ER for further evaluation regarding this.  She is currently being worked up for possible autoimmune issues.        Home Medications Prior to Admission medications   Medication Sig Start Date End Date Taking? Authorizing Provider  ibuprofen  (ADVIL ) 800 MG tablet Take 1 tablet (800 mg total) by mouth every 8 (eight) hours as needed. 06/03/20   Gabrielle Joiner, MD  Iron , Ferrous Sulfate , 325 (65 Fe) MG TABS Take 325 mg by mouth in the morning and at bedtime. Take with Vitamin C . 06/14/23 06/08/24  Catheryn Cluck, MD  KELNOR  1/35 1-35 MG-MCG tablet Take 1 tablet by mouth daily. 09/24/23   [provider]  lamoTRIgine  (LAMICTAL ) 100 MG tablet Take 100 mg by mouth daily. 03/28/23   [provider]  norethindrone-ethinyl estradiol  1/35 (ORTHO-NOVUM  1/35, 28,) tablet Take 1 tablet by mouth daily. Patient not taking: Reported on 10/17/2023 06/03/20   Gabrielle Joiner, MD  QUEtiapine  (SEROQUEL ) 100 MG tablet Take 150 mg by mouth at bedtime. Patient not taking: Reported on 10/17/2023 03/28/23   [provider]  QUEtiapine  (SEROQUEL ) 300 MG tablet Take 300 mg by  mouth at bedtime. 09/13/23   [provider]      Allergies    Patient has no known allergies.    Review of Systems   Review of Systems  Neurological:  Positive for numbness.  All other systems reviewed and are negative.   Physical Exam Updated Vital Signs BP (!) 134/95 (BP Location: Left Arm)   Pulse 96   Temp 98.3 F (36.8 C) (Oral)   Resp 16   Ht 5\' 5"  (1.651 m)   Wt 63.5 kg   LMP  (LMP Unknown)   SpO2 97%   BMI 23.30 kg/m  Physical Exam Vitals and nursing note reviewed.   Gen: NAD Eyes: PERRL, EOMI HEENT: no oropharyngeal swelling Neck: trachea midline Resp: clear to auscultation bilaterally Card: RRR, no murmurs, rubs, or gallops Abd: nontender, nondistended Extremities: no calf tenderness, no edema Vascular: 2+ radial pulses bilaterally, 2+ DP pulses bilaterally Neuro: NIH stroke scale of 1 for decreased sensation in the left upper extremity Skin: no rashes Psyc: acting appropriately   ED Results / Procedures / Treatments   Labs (all labs ordered are listed, but only abnormal results are displayed) Labs Reviewed  CBG MONITORING, ED - Abnormal; Notable for the following components:      Result Value   Glucose-Capillary 100 (*)    All other components within normal limits  ETHANOL  PROTIME-INR  APTT  CBC  DIFFERENTIAL  COMPREHENSIVE METABOLIC PANEL  WITH GFR  URINE DRUG SCREEN  PREGNANCY, URINE    EKG None  Radiology CT HEAD CODE STROKE WO CONTRAST Addendum Date: 11/13/2023 ADDENDUM REPORT: 11/13/2023 17:04 ADDENDUM: No evidence of an acute intracranial abnormality. These results were called by telephone at the time of interpretation on 11/13/2023 at 5:04 pm to provider Michale Weikel , who verbally acknowledged these results. Electronically Signed   By: Bascom Lily D.O.   On: 11/13/2023 17:04   Result Date: 11/13/2023 CLINICAL DATA:  Code stroke. Neuro deficit, acute, stroke suspected. EXAM: CT HEAD WITHOUT CONTRAST TECHNIQUE: Contiguous  axial images were obtained from the base of the skull through the vertex without intravenous contrast. RADIATION DOSE REDUCTION: This exam was performed according to the departmental dose-optimization program which includes automated exposure control, adjustment of the mA and/or kV according to patient size and/or use of iterative reconstruction technique. COMPARISON:  None. FINDINGS: Brain: Cerebral volume is normal. There is no acute intracranial hemorrhage. No demarcated cortical infarct. No extra-axial fluid collection. No evidence of an intracranial mass. No midline shift. Vascular: No hyperdense vessel. Skull: No calvarial fracture or aggressive osseous lesion. Sinuses/Orbits: No mass or acute finding within the imaged orbits. No significant paranasal sinus disease at the imaged levels. ASPECTS (Alberta Stroke Program Early CT Score) - Ganglionic level infarction (caudate, lentiform nuclei, internal capsule, insula, M1-M3 cortex): 7 - Supraganglionic infarction (M4-M6 cortex): 3 Total score (0-10 with 10 being normal): 10 Attempts are being made to reach the ordering provider at this time. IMPRESSION: No evidence of an acute intracranial abnormality. Electronically Signed: By: Bascom Lily D.O. On: 11/13/2023 16:58    Procedures Procedures    Medications Ordered in ED Medications - No data to display  ED Course/ Medical Decision Making/ A&P                                 Medical Decision Making 28 year old female with past medical history of anemia presenting to the emergency department today with numbness in her left arm and leg.  The left leg numbness has improved.  She is still having persistent numbness in the left arm objectively here on exam.  With her rapidly improving symptoms initially stroke alert was not called in I did try to reach out to neurology.  On reassessment a few minutes later she is still having the persistent numbness so a code stroke is initiated to have the patient  evaluated more urgently by neurology.  With her rapidly improving symptoms I do not think that she will be a candidate for tPA at this time.  I discussed her case with neurology after initial evaluation.  The patient's initial labs and CT are unremarkable.  I did discuss the patient's case with Dr. Lindzen.  He recommends have the patient sent over to: ED to ED to have an MRA of her head and neck as well as an MRI of her brain with and without contrast to evaluate for CVA versus MS.  Dr. Monnie Anthony accepts for ED to ED transfer.  CRITICAL CARE Performed by: Carin Charleston   Total critical care time: 30 minutes  Critical care time was exclusive of separately billable procedures and treating other patients.  Critical care was necessary to treat or prevent imminent or life-threatening deterioration.  Critical care was time spent personally by me on the following activities: development of treatment plan with patient and/or surrogate as well as nursing, discussions with  consultants, evaluation of patient's response to treatment, examination of patient, obtaining history from patient or surrogate, ordering and performing treatments and interventions, ordering and review of laboratory studies, ordering and review of radiographic studies, pulse oximetry and re-evaluation of patient's condition.   Amount and/or Complexity of Data Reviewed Labs: ordered. Radiology: ordered.           Final Clinical Impression(s) / ED Diagnoses Final diagnoses:  Left sided numbness    Rx / DC Orders ED Discharge Orders     None         Carin Charleston, MD 11/13/23 1730

## 2023-11-14 NOTE — Discharge Instructions (Addendum)
 You were seen in the emergency department today for concerns of numbness.  Your initial CT scan was negative but based on recommendations from our neurologist, you were transferred to [facility] for MRI imaging.  Your MRI imaging was also thankfully negative with no signs of stroke, demyelinating conditions, or other clearly explainable cause of your current symptoms.  Per the recommendations of her neurologist, a referral has been placed for you to follow-up with the neurologist in the outpatient setting with [facility] neurology.  Please plan following up with them.  Return to the emergency department for any concerns of new or worsening symptoms.

## 2023-11-21 ENCOUNTER — Ambulatory Visit: Admitting: Family Medicine

## 2023-11-21 DIAGNOSIS — F4320 Adjustment disorder, unspecified: Secondary | ICD-10-CM | POA: Diagnosis not present

## 2023-11-21 DIAGNOSIS — F4312 Post-traumatic stress disorder, chronic: Secondary | ICD-10-CM | POA: Diagnosis not present

## 2023-11-23 DIAGNOSIS — F4312 Post-traumatic stress disorder, chronic: Secondary | ICD-10-CM | POA: Diagnosis not present

## 2023-11-23 DIAGNOSIS — F319 Bipolar disorder, unspecified: Secondary | ICD-10-CM | POA: Diagnosis not present

## 2023-11-30 ENCOUNTER — Ambulatory Visit: Admitting: Family Medicine

## 2023-11-30 ENCOUNTER — Inpatient Hospital Stay: Admitting: Family Medicine

## 2023-12-04 ENCOUNTER — Inpatient Hospital Stay: Admitting: Family Medicine

## 2023-12-05 DIAGNOSIS — F4320 Adjustment disorder, unspecified: Secondary | ICD-10-CM | POA: Diagnosis not present

## 2023-12-05 DIAGNOSIS — F319 Bipolar disorder, unspecified: Secondary | ICD-10-CM | POA: Diagnosis not present

## 2023-12-05 DIAGNOSIS — F4312 Post-traumatic stress disorder, chronic: Secondary | ICD-10-CM | POA: Diagnosis not present

## 2023-12-12 ENCOUNTER — Ambulatory Visit: Admitting: Family Medicine

## 2023-12-12 ENCOUNTER — Ambulatory Visit: Payer: Medicaid Other | Admitting: Family Medicine

## 2023-12-14 ENCOUNTER — Telehealth: Payer: Self-pay

## 2023-12-14 ENCOUNTER — Encounter: Payer: Self-pay | Admitting: Family Medicine

## 2023-12-14 ENCOUNTER — Other Ambulatory Visit (HOSPITAL_COMMUNITY): Payer: Self-pay

## 2023-12-14 ENCOUNTER — Ambulatory Visit: Admitting: Family Medicine

## 2023-12-14 VITALS — BP 116/66 | HR 114 | Temp 98.0°F | Ht 65.0 in | Wt 142.4 lb

## 2023-12-14 DIAGNOSIS — G4719 Other hypersomnia: Secondary | ICD-10-CM

## 2023-12-14 DIAGNOSIS — R5382 Chronic fatigue, unspecified: Secondary | ICD-10-CM

## 2023-12-14 DIAGNOSIS — D508 Other iron deficiency anemias: Secondary | ICD-10-CM | POA: Diagnosis not present

## 2023-12-14 MED ORDER — ARMODAFINIL 50 MG PO TABS
50.0000 mg | ORAL_TABLET | Freq: Every day | ORAL | 0 refills | Status: AC
Start: 2023-12-14 — End: 2024-01-13

## 2023-12-14 NOTE — Progress Notes (Signed)
 Assessment & Plan   Assessment/Plan:        Assessment and Plan    Iron  Deficiency Anemia Persistent fatigue, lightheadedness, and joint pain with worsening blood test results despite daily iron  supplementation. No menstrual blood loss due to birth control and no visible blood in stool, though stools are dark. Possible malabsorption or gastrointestinal bleeding considered. Elevated CRP and decreased sed rate. Iron  transfusion recommended by rheumatologist, but not initiated due to lack of hematology follow-up. Consideration of dietary modifications to enhance iron  absorption, including taking iron  with vitamin C  and avoiding calcium -rich foods around the time of iron  intake. - Refer to hematology for evaluation and potential iron  infusions - Refer to gastroenterology to evaluate for potential gastrointestinal bleeding - Provide fecal occult blood test (FOBT) kit for home use - Advise on dietary modifications to enhance iron  absorption, including taking iron  with vitamin C  and avoiding calcium -rich foods around the time of iron  intake  Excessive Daytime Sleepiness Severe fatigue affecting daily functioning, including falling asleep while sitting up. Considered related to iron  deficiency and malabsorption. She expresses need for immediate intervention due to safety concerns while caring for young children. Discussed the use of Nuvigil as a stimulant to manage excessive daytime sleepiness, with potential side effects including mood changes and rare risk of mania in bipolar disorder. Informed consent obtained regarding the use of Nuvigil, emphasizing the importance of informing her psychiatrist about the new medication. - Prescribe Nuvigil 50 mg for excessive daytime sleepiness - Advise her to inform psychiatrist about the new medication - Schedule follow-up in one month to assess response to Nuvigil          There are no discontinued medications.  Return in about 1 month (around  01/13/2024).        Subjective:   Encounter date: 12/14/2023  Julie [name] is a 28 y.o. female who has Acne; Bipolar I disorder, moderate, current or most recent episode manic, in partial remission, with anxious distress (HCC); Generalized anxiety disorder; Obsessive compulsive disorder; Encounter for initial prescription of implantable subdermal contraceptive; Nexplanon  in place; Chronic fatigue; Joint swelling; Caregiver stress; History of substance abuse (HCC); Elevated C-reactive protein (CRP); and Elevated sed rate on their problem list..   She  has a past medical history of Anxiety, Asthma, Depression, and Hypertension..   She presents with chief complaint of Follow-up (Referral to hematologist ) .   Discussed the use of AI scribe software for clinical note transcription with the patient, who gave verbal consent to proceed.  History of Present Illness   Julie [name] is a 28 year old female who presents with persistent fatigue and lightheadedness.  She experiences persistent fatigue and severe lightheadedness, sometimes requiring her to sit down to avoid syncope. These symptoms have been ongoing since December. Despite taking daily iron  supplements, her blood tests have shown worsening iron  levels, and her rheumatologist recommended an iron  transfusion.  She also experiences joint pain, which was more severe during the winter months but has improved with warmer weather. No menorrhagia or hematochezia, although she notes her stool is very dark, possibly due to the iron  supplements.  In the past, she experienced numbness in her left arm, leading to an emergency department visit where stroke workup, including CT and MRI scans of the head and neck, was performed and found to be normal. Neurology follow-up is scheduled for August 15th.  Her current medications include daily iron  supplements. She has been trying to increase her intake of iron -rich foods, such  as eggs, leafy  greens, and red meat, despite being a picky eater. Her mood is affected by her exhaustion, leading to a lack of motivation and difficulty focusing. She does not take vitamin C , which could aid in iron  absorption, and she avoids caffeine due to headaches after consumption.  No history of anemia or blood clots. She bruises easily.         ROS  Past Surgical History:  Procedure Laterality Date   INSERTION OF IMPLANON  ROD  04/13/2020       NO PAST SURGERIES      Outpatient Medications Prior to Visit  Medication Sig Dispense Refill   Iron , Ferrous Sulfate , 325 (65 Fe) MG TABS Take 325 mg by mouth in the morning and at bedtime. Take with Vitamin C . 30 tablet 11   KELNOR  1/35 1-35 MG-MCG tablet Take 1 tablet by mouth daily.     lamoTRIgine  (LAMICTAL ) 100 MG tablet Take 100 mg by mouth daily.     QUEtiapine  (SEROQUEL ) 300 MG tablet Take 300 mg by mouth at bedtime.     No facility-administered medications prior to visit.    Family History  Problem Relation Age of Onset   Healthy Mother    Bipolar disorder Father    Alcohol abuse Paternal Grandmother     Social History   Socioeconomic History   Marital status: Single    Spouse name: Not on file   Number of children: Not on file   Years of education: Not on file   Highest education level: Some college, no degree  Occupational History   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 [employer]  Tobacco Use   Smoking status: Never    Passive exposure: Never   Smokeless tobacco: Never  Vaping Use   Vaping status: Never Used  Substance and Sexual Activity   Alcohol use: No    Comment: hx of substance abuse.Denies all now   Drug use: Not Currently    Comment: History of drug use/clean 3 months   Sexual activity: Yes    Partners: Male    Birth control/protection: Pill, None  Other Topics Concern   Not on file  Social History Narrative   Not on file   Social Drivers of Health   Financial  Resource Strain: Low Risk  (05/16/2023)   Overall Financial Resource Strain (CARDIA)    Difficulty of Paying Living Expenses: Not very hard  Food Insecurity: No Food Insecurity (05/16/2023)   Hunger Vital Sign    Worried About Running Out of Food in the Last Year: Never true    Ran Out of Food in the Last Year: Never true  Transportation Needs: No Transportation Needs (05/16/2023)   PRAPARE - Administrator, Civil Service (Medical): No    Lack of Transportation (Non-Medical): No  Physical Activity: Unknown (12/14/2023)   Exercise Vital Sign    Days of Exercise per Week: Patient declined    Minutes of Exercise per Session: Not on file  Stress: Stress Concern Present (12/14/2023)   [employer] of Occupational Health - Occupational Stress Questionnaire    Feeling of Stress: To some extent  Social Connections: Unknown (12/14/2023)   Social Connection and Isolation Panel    Frequency of Communication with Friends and Family: Once a week    Frequency of Social Gatherings with Friends and Family: Patient declined    Attends Religious Services: Never    Database administrator or  Organizations: No    Attends Engineer, structural: Not on file    Marital Status: Living with partner  Intimate Partner Violence: Not on file                                                                                                  Objective:  Physical Exam: BP 116/66 (BP Location: Left Arm, Patient Position: Sitting, Cuff Size: Normal)   Pulse (!) 114   Temp 98 F (36.7 C) (Temporal)   Ht 5' 5 (1.651 m)   Wt 142 lb 6.4 oz (64.6 kg)   LMP  (LMP Unknown)   SpO2 98%   BMI 23.70 kg/m    Physical Exam   GENERAL: Alert, cooperative, well developed, no acute distress HEENT: Normocephalic, normal oropharynx, moist mucous membranes CHEST: Clear to auscultation bilaterally, No wheezes, rhonchi, or crackles CARDIOVASCULAR: Normal heart rate and rhythm, S1 and S2 normal without  murmurs ABDOMEN: Soft, non-tender, non-distended, without organomegaly, Normal bowel sounds EXTREMITIES: No cyanosis or edema NEUROLOGICAL: Cranial nerves grossly intact, Moves all extremities without gross motor or sensory deficit         MR Angiogram Neck W or Wo Contrast Result Date: 11/14/2023 CLINICAL DATA:  Transient ischemic attack EXAM: MRI HEAD WITHOUT AND WITH CONTRAST MRA HEAD WITHOUT CONTRAST MRA NECK WITHOUT AND WITH CONTRAST TECHNIQUE: Multiplanar, multiecho pulse sequences of the brain and surrounding structures were obtained without and with intravenous contrast. Angiographic images of the Circle of Willis were obtained using MRA technique without intravenous contrast. Angiographic images of the neck were obtained using MRA technique without and with intravenous contrast. Carotid stenosis measurements (when applicable) are obtained utilizing NASCET criteria, using the distal internal carotid diameter as the denominator. CONTRAST:  6mL GADAVIST  GADOBUTROL  1 MMOL/ML IV SOLN COMPARISON:  None Available. FINDINGS: MRI HEAD FINDINGS Brain: No acute infarct, mass effect or extra-axial collection. No acute or chronic hemorrhage. Normal white matter signal, parenchymal volume and CSF spaces. The midline structures are normal. There is no abnormal contrast enhancement. Vascular: Normal flow voids. Skull and upper cervical spine: Normal calvarium and skull base. Visualized upper cervical spine and soft tissues are normal. Sinuses/Orbits:No paranasal sinus fluid levels or advanced mucosal thickening. No mastoid or middle ear effusion. Normal orbits. MRA HEAD FINDINGS POSTERIOR CIRCULATION: Vertebral arteries are normal. No proximal occlusion of the anterior or inferior cerebellar arteries. Basilar artery is normal. Superior cerebellar arteries are normal. Posterior cerebral arteries are normal. ANTERIOR CIRCULATION: Intracranial internal carotid arteries are normal. Anterior cerebral arteries are  normal. Middle cerebral arteries are normal. Anatomic Variants: None MRA NECK FINDINGS Both carotid systems and the codominant vertebral arteries are normal. Normal 3 vessel branching pattern of the aortic arch. Visualized subclavian arteries are patent. IMPRESSION: 1. Normal MRI of the brain. 2. Normal MRA of the head and neck. Electronically Signed   By: Juanetta Nordmann M.D.   On: 11/14/2023 00:41   MR Brain W and Wo Contrast Result Date: 11/14/2023 CLINICAL DATA:  Transient ischemic attack EXAM: MRI HEAD WITHOUT AND WITH CONTRAST MRA HEAD WITHOUT CONTRAST MRA NECK WITHOUT AND  WITH CONTRAST TECHNIQUE: Multiplanar, multiecho pulse sequences of the brain and surrounding structures were obtained without and with intravenous contrast. Angiographic images of the Circle of Willis were obtained using MRA technique without intravenous contrast. Angiographic images of the neck were obtained using MRA technique without and with intravenous contrast. Carotid stenosis measurements (when applicable) are obtained utilizing NASCET criteria, using the distal internal carotid diameter as the denominator. CONTRAST:  6mL GADAVIST  GADOBUTROL  1 MMOL/ML IV SOLN COMPARISON:  None Available. FINDINGS: MRI HEAD FINDINGS Brain: No acute infarct, mass effect or extra-axial collection. No acute or chronic hemorrhage. Normal white matter signal, parenchymal volume and CSF spaces. The midline structures are normal. There is no abnormal contrast enhancement. Vascular: Normal flow voids. Skull and upper cervical spine: Normal calvarium and skull base. Visualized upper cervical spine and soft tissues are normal. Sinuses/Orbits:No paranasal sinus fluid levels or advanced mucosal thickening. No mastoid or middle ear effusion. Normal orbits. MRA HEAD FINDINGS POSTERIOR CIRCULATION: Vertebral arteries are normal. No proximal occlusion of the anterior or inferior cerebellar arteries. Basilar artery is normal. Superior cerebellar arteries are normal.  Posterior cerebral arteries are normal. ANTERIOR CIRCULATION: Intracranial internal carotid arteries are normal. Anterior cerebral arteries are normal. Middle cerebral arteries are normal. Anatomic Variants: None MRA NECK FINDINGS Both carotid systems and the codominant vertebral arteries are normal. Normal 3 vessel branching pattern of the aortic arch. Visualized subclavian arteries are patent. IMPRESSION: 1. Normal MRI of the brain. 2. Normal MRA of the head and neck. Electronically Signed   By: Juanetta Nordmann M.D.   On: 11/14/2023 00:41   MR ANGIO HEAD WO CONTRAST Result Date: 11/14/2023 CLINICAL DATA:  Transient ischemic attack EXAM: MRI HEAD WITHOUT AND WITH CONTRAST MRA HEAD WITHOUT CONTRAST MRA NECK WITHOUT AND WITH CONTRAST TECHNIQUE: Multiplanar, multiecho pulse sequences of the brain and surrounding structures were obtained without and with intravenous contrast. Angiographic images of the Circle of Willis were obtained using MRA technique without intravenous contrast. Angiographic images of the neck were obtained using MRA technique without and with intravenous contrast. Carotid stenosis measurements (when applicable) are obtained utilizing NASCET criteria, using the distal internal carotid diameter as the denominator. CONTRAST:  6mL GADAVIST  GADOBUTROL  1 MMOL/ML IV SOLN COMPARISON:  None Available. FINDINGS: MRI HEAD FINDINGS Brain: No acute infarct, mass effect or extra-axial collection. No acute or chronic hemorrhage. Normal white matter signal, parenchymal volume and CSF spaces. The midline structures are normal. There is no abnormal contrast enhancement. Vascular: Normal flow voids. Skull and upper cervical spine: Normal calvarium and skull base. Visualized upper cervical spine and soft tissues are normal. Sinuses/Orbits:No paranasal sinus fluid levels or advanced mucosal thickening. No mastoid or middle ear effusion. Normal orbits. MRA HEAD FINDINGS POSTERIOR CIRCULATION: Vertebral arteries are  normal. No proximal occlusion of the anterior or inferior cerebellar arteries. Basilar artery is normal. Superior cerebellar arteries are normal. Posterior cerebral arteries are normal. ANTERIOR CIRCULATION: Intracranial internal carotid arteries are normal. Anterior cerebral arteries are normal. Middle cerebral arteries are normal. Anatomic Variants: None MRA NECK FINDINGS Both carotid systems and the codominant vertebral arteries are normal. Normal 3 vessel branching pattern of the aortic arch. Visualized subclavian arteries are patent. IMPRESSION: 1. Normal MRI of the brain. 2. Normal MRA of the head and neck. Electronically Signed   By: Juanetta Nordmann M.D.   On: 11/14/2023 00:41   CT HEAD CODE STROKE WO CONTRAST Addendum Date: 11/13/2023 ADDENDUM REPORT: 11/13/2023 17:04 ADDENDUM: No evidence of an acute intracranial abnormality. These results were called  by telephone at the time of interpretation on 11/13/2023 at 5:04 pm to provider ANDREW TEE , who verbally acknowledged these results. Electronically Signed   By: Bascom Lily D.O.   On: 11/13/2023 17:04   Result Date: 11/13/2023 CLINICAL DATA:  Code stroke. Neuro deficit, acute, stroke suspected. EXAM: CT HEAD WITHOUT CONTRAST TECHNIQUE: Contiguous axial images were obtained from the base of the skull through the vertex without intravenous contrast. RADIATION DOSE REDUCTION: This exam was performed according to the departmental dose-optimization program which includes automated exposure control, adjustment of the mA and/or kV according to patient size and/or use of iterative reconstruction technique. COMPARISON:  None. FINDINGS: Brain: Cerebral volume is normal. There is no acute intracranial hemorrhage. No demarcated cortical infarct. No extra-axial fluid collection. No evidence of an intracranial mass. No midline shift. Vascular: No hyperdense vessel. Skull: No calvarial fracture or aggressive osseous lesion. Sinuses/Orbits: No mass or acute finding within  the imaged orbits. No significant paranasal sinus disease at the imaged levels. ASPECTS (Alberta Stroke Program Early CT Score) - Ganglionic level infarction (caudate, lentiform nuclei, internal capsule, insula, M1-M3 cortex): 7 - Supraganglionic infarction (M4-M6 cortex): 3 Total score (0-10 with 10 being normal): 10 Attempts are being made to reach the ordering provider at this time. IMPRESSION: No evidence of an acute intracranial abnormality. Electronically Signed: By: Bascom Lily D.O. On: 11/13/2023 16:58    Recent Results (from the past 2160 hours)  Sedimentation rate     Status: None   Collection Time: 10/17/23  9:49 AM  Result Value Ref Range   Sed Rate 17 0 - 20 mm/h  C-reactive protein     Status: Abnormal   Collection Time: 10/17/23  9:49 AM  Result Value Ref Range   CRP 26.5 (H) <8.0 mg/L  IgG, IgA, IgM     Status: None   Collection Time: 10/17/23  9:49 AM  Result Value Ref Range   Immunoglobulin A 126 47 - 310 mg/dL   IgG (Immunoglobin G), Serum 938 600 - 1,640 mg/dL   IgM, Serum 75 50 - 161 mg/dL  Iron , TIBC and Ferritin Panel     Status: Abnormal   Collection Time: 10/17/23  9:49 AM  Result Value Ref Range   Iron  37 (L) 40 - 190 mcg/dL   TIBC 096 (H) 250 - 450 mcg/dL (calc)   %SAT 8 (L) 16 - 45 % (calc)   Ferritin 17 16 - 154 ng/mL  Ethanol     Status: None   Collection Time: 11/13/23  4:35 PM  Result Value Ref Range   Alcohol, Ethyl (B) <15 <15 mg/dL    Comment: Please note change in reference range. (NOTE) For medical purposes only. Performed at [facility], [address] [place], [place] [address]   Protime-INR     Status: None   Collection Time: 11/13/23  4:35 PM  Result Value Ref Range   Prothrombin Time 12.9 11.4 - 15.2 seconds   INR 1.0 0.8 - 1.2    Comment: (NOTE) INR goal varies based on device and disease states. Performed at [facility], [address] [place], [place] [address]   APTT     Status: None   Collection  Time: 11/13/23  4:35 PM  Result Value Ref Range   aPTT 25 24 - 36 seconds    Comment: Performed at [facility], [address] [place], [place] [address]  CBC     Status:  None   Collection Time: 11/13/23  4:35 PM  Result Value Ref Range   WBC 5.3 4.0 - 10.5 K/uL   RBC 4.12 3.87 - 5.11 MIL/uL   Hemoglobin 12.0 12.0 - 15.0 g/dL   HCT 28.4 13.2 - 44.0 %   MCV 88.1 80.0 - 100.0 fL   MCH 29.1 26.0 - 34.0 pg   MCHC 33.1 30.0 - 36.0 g/dL   RDW 10.2 72.5 - 36.6 %   Platelets 290 150 - 400 K/uL   nRBC 0.0 0.0 - 0.2 %    Comment: Performed at [facility], [address] [place], [place] [address]  Differential     Status: None   Collection Time: 11/13/23  4:35 PM  Result Value Ref Range   Neutrophils Relative % 43 %   Neutro Abs 2.3 1.7 - 7.7 K/uL   Lymphocytes Relative 47 %   Lymphs Abs 2.5 0.7 - 4.0 K/uL   Monocytes Relative 7 %   Monocytes Absolute 0.4 0.1 - 1.0 K/uL   Eosinophils Relative 2 %   Eosinophils Absolute 0.1 0.0 - 0.5 K/uL   Basophils Relative 1 %   Basophils Absolute 0.0 0.0 - 0.1 K/uL   Immature Granulocytes 0 %   Abs Immature Granulocytes 0.01 0.00 - 0.07 K/uL    Comment: Performed at [facility], [address] [place], [place] [address]  Comprehensive metabolic panel     Status: None   Collection Time: 11/13/23  4:35 PM  Result Value Ref Range   Sodium 139 135 - 145 mmol/L   Potassium 4.4 3.5 - 5.1 mmol/L   Chloride 104 98 - 111 mmol/L   CO2 23 22 - 32 mmol/L   Glucose, Bld 96 70 - 99 mg/dL    Comment: Glucose reference range applies only to samples taken after fasting for at least 8 hours.   BUN 9 6 - 20 mg/dL   Creatinine, Ser 5.63 0.44 - 1.00 mg/dL   Calcium  9.8 8.9 - 10.3 mg/dL   Total Protein 7.2 6.5 - 8.1 g/dL   Albumin 4.4 3.5 - 5.0 g/dL   AST 17 15 - 41 U/L   ALT 16 0 - 44 U/L   Alkaline Phosphatase 61 38 - 126 U/L   Total Bilirubin 0.3 0.0 - 1.2 mg/dL   GFR, Estimated >87 >56 mL/min    Comment:  (NOTE) Calculated using the CKD-EPI Creatinine Equation (2021)    Anion gap 12 5 - 15    Comment: Performed at [facility], [address] [place], [place] [address]  CBG monitoring, ED     Status: Abnormal   Collection Time: 11/13/23  4:42 PM  Result Value Ref Range   Glucose-Capillary 100 (H) 70 - 99 mg/dL    Comment: Glucose reference range applies only to samples taken after fasting for at least 8 hours.  Rapid urine drug screen (hospital performed)     Status: None   Collection Time: 11/13/23  5:33 PM  Result Value Ref Range   Opiates NONE DETECTED NONE DETECTED   Cocaine NONE DETECTED NONE DETECTED   Benzodiazepines NONE DETECTED NONE DETECTED   Amphetamines NONE DETECTED NONE DETECTED   Tetrahydrocannabinol NONE DETECTED NONE DETECTED   Barbiturates NONE DETECTED NONE DETECTED   Methadone Scn, Ur NONE DETECTED NONE DETECTED   Fentanyl  NONE DETECTED NONE DETECTED    Comment: (NOTE) Drug Screen for Medical Purposes only. If confirmation is needed for  any purpose, notify lab within 5 days. Drug Class                 Cutoff (ng/mL) Amphetamine and metabolites 1000 Barbiturate and metabolites 200 Benzodiazepine              200 Opiates and metabolites     300 Cocaine and metabolites     300 THC                         50 Fentanyl                     5 Methadone                   300  Trazodone is metabolized in vivo to several metabolites,  including pharmacologically active m-CPP, which is excreted in the  urine.  Immunoassay screens for amphetamines and MDMA have potential  cross-reactivity with these compounds and may provide false positive  result.  Performed at [facility], [address] [place], [place] [address]   Pregnancy, urine     Status: None   Collection Time: 11/13/23  5:33 PM  Result Value Ref Range   Preg Test, Ur NEGATIVE NEGATIVE    Comment:        THE SENSITIVITY OF THIS METHODOLOGY IS >25 mIU/mL. Performed at [facility], [address], [place], [place] [address]         Carnell Christian, MD, MS

## 2023-12-14 NOTE — Telephone Encounter (Signed)
 Pharmacy Patient Advocate Encounter   Received notification from CoverMyMeds that prior authorization for Armodafinil 50MG  tablets is required/requested.   Insurance verification completed.   The patient is insured through [facility] .   Per test claim: PA required; PA submitted to above mentioned insurance via CoverMyMeds Key/confirmation #/EOC (Key: [id number])     Status is pending

## 2023-12-14 NOTE — Patient Instructions (Addendum)
  VISIT SUMMARY: Today, you were seen for persistent fatigue and lightheadedness that have been ongoing since December. We discussed your worsening iron  levels despite daily supplementation, joint pain, and the impact of your symptoms on your daily life. We also reviewed your past medical history, including a normal stroke workup and your current medications.  YOUR PLAN: -IRON  DEFICIENCY ANEMIA: Iron  deficiency anemia occurs when your body doesn't have enough iron  to produce adequate levels of hemoglobin, which is necessary for carrying oxygen in your blood. We recommend seeing a hematologist for evaluation and potential iron  infusions. You should also see a gastroenterologist to check for possible gastrointestinal bleeding. We provided you with a fecal occult blood test (FOBT) kit to use at home. To help with iron  absorption, take your iron  supplements with vitamin C  and avoid calcium -rich foods around the time of iron  intake.  -EXCESSIVE DAYTIME SLEEPINESS: Excessive daytime sleepiness is a condition where you feel very tired during the day, which can affect your daily activities. This may be related to your iron  deficiency. We have prescribed Nuvigil 50 mg to help manage your sleepiness. Please inform your psychiatrist about this new medication. We will follow up in one month to see how you are responding to Nuvigil.  INSTRUCTIONS: Please schedule appointments with a hematologist and a gastroenterologist as soon as possible. Use the fecal occult blood test (FOBT) kit at home as instructed. Inform your psychiatrist about starting Nuvigil and return for a follow-up visit in one month to assess your response to the medication.                      Contains text generated by Abridge.                                 Contains text generated by Abridge.

## 2023-12-15 ENCOUNTER — Other Ambulatory Visit (HOSPITAL_COMMUNITY): Payer: Self-pay

## 2023-12-15 ENCOUNTER — Telehealth: Payer: Self-pay

## 2023-12-15 DIAGNOSIS — G4719 Other hypersomnia: Secondary | ICD-10-CM

## 2023-12-15 DIAGNOSIS — R5382 Chronic fatigue, unspecified: Secondary | ICD-10-CM

## 2023-12-15 NOTE — Telephone Encounter (Signed)
 Pharmacy Patient Advocate Encounter  Received notification from [facility] that Prior Authorization for Armodafinil  50MG  tablets has been DENIED.  Full denial letter will be uploaded to the media tab. See denial reason below.  Brand is pref'd     PA #/Case ID/Reference #: (Key: [id number])

## 2023-12-15 NOTE — Telephone Encounter (Signed)
 Pharmacy Patient Advocate Encounter  Received notification from [facility] that Prior Authorization for Nuvigil  150MG  tablets has been APPROVED from 6.13.25 to 6.13.26. This test claim was processed through [facility]- copay amounts may vary at other pharmacies due to pharmacy/plan contracts, or as the patient moves through the different stages of their insurance plan.   PA #/Case ID/Reference #: (Key: [id number])

## 2023-12-15 NOTE — Telephone Encounter (Signed)
 Pharmacy Patient Advocate Encounter  Received notification from [facility] that Prior Authorization for Nuvigil  50MG  tablets has been APPROVED from 6.13.25 to 6.13.26. Aaron Aas This test claim was processed through [facility]- copay amounts may vary at other pharmacies due to pharmacy/plan contracts, or as the patient moves through the different stages of their insurance plan.   PA #/Case ID/Reference #: (Key: [id number])

## 2023-12-15 NOTE — Telephone Encounter (Signed)
 Hi Dr Hildy Lowers,                         I received a Prior Authorization request for the patients ''Armodafinil  ''. However only the Brand is covered and I have gotten that P/A Approved. If appropriate could you send brand/Nuvigil  into the pref'd pharmacy instead? Please advise

## 2023-12-18 ENCOUNTER — Other Ambulatory Visit: Payer: Self-pay

## 2023-12-18 DIAGNOSIS — G4719 Other hypersomnia: Secondary | ICD-10-CM

## 2023-12-18 DIAGNOSIS — R5382 Chronic fatigue, unspecified: Secondary | ICD-10-CM

## 2023-12-18 MED ORDER — ARMODAFINIL 50 MG PO TABS: 50.0000 mg | ORAL_TABLET | Freq: Every day | ORAL | 0 refills | Status: DC

## 2023-12-18 MED ORDER — NUVIGIL 50 MG PO TABS: 50.0000 mg | ORAL_TABLET | Freq: Every day | ORAL | 0 refills | Status: DC

## 2023-12-18 NOTE — Addendum Note (Signed)
 Addended by: Kasandra Pain B on: 12/18/2023 04:57 PM   Modules accepted: Orders

## 2023-12-18 NOTE — Telephone Encounter (Signed)
 Patient requesting to resubmit Rx to the pharmacy

## 2023-12-20 ENCOUNTER — Other Ambulatory Visit (HOSPITAL_COMMUNITY): Payer: Self-pay

## 2023-12-20 ENCOUNTER — Other Ambulatory Visit: Payer: Self-pay | Admitting: Family

## 2023-12-20 DIAGNOSIS — D509 Iron deficiency anemia, unspecified: Secondary | ICD-10-CM

## 2023-12-21 DIAGNOSIS — F4320 Adjustment disorder, unspecified: Secondary | ICD-10-CM | POA: Diagnosis not present

## 2023-12-21 DIAGNOSIS — F319 Bipolar disorder, unspecified: Secondary | ICD-10-CM | POA: Diagnosis not present

## 2023-12-21 DIAGNOSIS — F4312 Post-traumatic stress disorder, chronic: Secondary | ICD-10-CM | POA: Diagnosis not present

## 2023-12-22 ENCOUNTER — Inpatient Hospital Stay: Attending: Hematology & Oncology

## 2023-12-22 ENCOUNTER — Inpatient Hospital Stay: Admitting: Family

## 2023-12-22 VITALS — BP 118/72 | HR 111 | Temp 98.5°F | Resp 16 | Ht 65.0 in | Wt 143.8 lb

## 2023-12-22 DIAGNOSIS — D509 Iron deficiency anemia, unspecified: Secondary | ICD-10-CM | POA: Insufficient documentation

## 2023-12-22 DIAGNOSIS — Z79899 Other long term (current) drug therapy: Secondary | ICD-10-CM | POA: Diagnosis not present

## 2023-12-22 DIAGNOSIS — D508 Other iron deficiency anemias: Secondary | ICD-10-CM | POA: Insufficient documentation

## 2023-12-22 DIAGNOSIS — Z806 Family history of leukemia: Secondary | ICD-10-CM | POA: Diagnosis not present

## 2023-12-22 LAB — CBC WITH DIFFERENTIAL (CANCER CENTER ONLY)
Abs Immature Granulocytes: 0 10*3/uL (ref 0.00–0.07)
Basophils Absolute: 0 10*3/uL (ref 0.0–0.1)
Basophils Relative: 0 %
Eosinophils Absolute: 0.1 10*3/uL (ref 0.0–0.5)
Eosinophils Relative: 2 %
HCT: 33.2 % — ABNORMAL LOW (ref 36.0–46.0)
Hemoglobin: 11.2 g/dL — ABNORMAL LOW (ref 12.0–15.0)
Lymphocytes Relative: 75 %
Lymphs Abs: 3.9 10*3/uL (ref 0.7–4.0)
MCH: 29.6 pg (ref 26.0–34.0)
MCHC: 33.7 g/dL (ref 30.0–36.0)
MCV: 87.6 fL (ref 80.0–100.0)
Monocytes Absolute: 0.1 10*3/uL (ref 0.1–1.0)
Monocytes Relative: 2 %
Neutro Abs: 1.1 10*3/uL — ABNORMAL LOW (ref 1.7–7.7)
Neutrophils Relative %: 21 %
Platelet Count: 323 10*3/uL (ref 150–400)
RBC: 3.79 MIL/uL — ABNORMAL LOW (ref 3.87–5.11)
RDW: 12.1 % (ref 11.5–15.5)
Smear Review: NORMAL
WBC Count: 5.2 10*3/uL (ref 4.0–10.5)
nRBC: 0 % (ref 0.0–0.2)

## 2023-12-22 LAB — RETICULOCYTES
Immature Retic Fract: 9.7 % (ref 2.3–15.9)
RBC.: 3.82 MIL/uL — ABNORMAL LOW (ref 3.87–5.11)
Retic Count, Absolute: 53.5 10*3/uL (ref 19.0–186.0)
Retic Ct Pct: 1.4 % (ref 0.4–3.1)

## 2023-12-22 LAB — IRON AND IRON BINDING CAPACITY (CC-WL,HP ONLY)
Iron: 92 ug/dL (ref 28–170)
Saturation Ratios: 19 % (ref 10.4–31.8)
TIBC: 473 ug/dL — ABNORMAL HIGH (ref 250–450)
UIBC: 381 ug/dL (ref 148–442)

## 2023-12-22 LAB — FERRITIN: Ferritin: 58 ng/mL (ref 11–307)

## 2023-12-22 NOTE — Progress Notes (Signed)
 Hematology/Oncology Consultation   Name: Julie [name]      MRN: [id number]    Location: Room/bed info not found  Date: 12/22/2023 Time:9:10 AM   REFERRING PHYSICIAN:  Kasandra Pain, MD  REASON FOR CONSULT: Iron  deficiency anemia secondary to inadequate dietary intake     DIAGNOSIS: Iron  deficiency anemia secondary to inadequate dietary intake    HISTORY OF PRESENT ILLNESS: Ms. Savahe is a pleasant 28 yo caucasian female with history of IDA.  She states that she is a picky eater but is not vegan or vegetarian.  No blood loss. She does not have a cycle on her current birth control (Kelnor ).  No abnormal bruising, no petechiae.  Iron  saturation back in April was only 8% and ferritin 17. Hgb today is 11.2, MCV 87, platelets 323 and WBC count 5.2.  She is symptomatic with fatigue, insomnia, lightheadedness, falling asleep when sitting at work, palpitations and numbness and tingling in the left arm that comes and goes (She states that cardiology work up was negative).  She notes occasional abdominal bloating.  Weight is described as stable at 143 lbs.  No fever, chills, n/v, cough, rash, SOB, chest pain, abdominal pain or changes in bowel or bladder habits.  Her mother also has history of anemia.  She has 2 children that she states were carried to term and delivered without any complications. No history of miscarriage.  No past history of surgery.  She has history of joint pain and swelling and is currently being worked up by rheumatology.  No falls or syncope reported.  No personal history of cancer. Her maternal grandmother had leukemia.  No diabetes or thyroid  disease.  No smoking, ETOH or recreational drug use.  She works as a Manufacturing engineer.   ROS: All other 10 point review of systems is negative.   PAST MEDICAL HISTORY:   Past Medical History:  Diagnosis Date   Anxiety    Asthma    Depression    Hypertension     ALLERGIES: No Known Allergies    MEDICATIONS:   Current Outpatient Medications on File Prior to Visit  Medication Sig Dispense Refill   Iron , Ferrous Sulfate , 325 (65 Fe) MG TABS Take 325 mg by mouth in the morning and at bedtime. Take with Vitamin C . 30 tablet 11   KELNOR  1/35 1-35 MG-MCG tablet Take 1 tablet by mouth daily.     lamoTRIgine  (LAMICTAL ) 100 MG tablet Take 100 mg by mouth daily.     NUVIGIL  50 MG tablet Take 1 tablet (50 mg total) by mouth daily. 30 tablet 0   QUEtiapine  (SEROQUEL ) 300 MG tablet Take 300 mg by mouth at bedtime.     No current facility-administered medications on file prior to visit.     PAST SURGICAL HISTORY Past Surgical History:  Procedure Laterality Date   INSERTION OF IMPLANON  ROD  04/13/2020       NO PAST SURGERIES      FAMILY HISTORY: Family History  Problem Relation Age of Onset   Healthy Mother    Bipolar disorder Father    Alcohol abuse Paternal Grandmother     SOCIAL HISTORY:  reports that she has never smoked. She has never been exposed to tobacco smoke. She has never used smokeless tobacco. She reports that she does not currently use drugs. She reports that she does not drink alcohol.  PERFORMANCE STATUS: The patient's performance status is 1 - Symptomatic but completely ambulatory  PHYSICAL EXAM: Most Recent Vital Signs: Blood  pressure 118/72, pulse (!) 111, temperature 98.5 F (36.9 C), temperature source Oral, resp. rate 16, height 5' 5 (1.651 m), weight 143 lb 12.8 oz (65.2 kg), SpO2 98%. BP 118/72 (BP Location: Right Arm, Patient Position: Sitting)   Pulse (!) 111   Temp 98.5 F (36.9 C) (Oral)   Resp 16   Ht 5' 5 (1.651 m)   Wt 143 lb 12.8 oz (65.2 kg)   SpO2 98%   BMI 23.93 kg/m   General Appearance:    Alert, cooperative, no distress, appears stated age  Head:    Normocephalic, without obvious abnormality, atraumatic  Eyes:    PERRL, conjunctiva/corneas clear, EOM's intact, fundi    benign, both eyes        Throat:   Lips, mucosa, and tongue normal; teeth  and gums normal  Neck:   Supple, symmetrical, trachea midline, no adenopathy;    thyroid :  no enlargement/tenderness/nodules; no carotid   bruit or JVD  Back:     Symmetric, no curvature, ROM normal, no CVA tenderness  Lungs:     Clear to auscultation bilaterally, respirations unlabored  Chest Wall:    No tenderness or deformity   Heart:    Regular rate and rhythm, S1 and S2 normal, no murmur, rub   or gallop     Abdomen:     Soft, non-tender, bowel sounds active all four quadrants,    no masses, no organomegaly        Extremities:   Extremities normal, atraumatic, no cyanosis or edema  Pulses:   2+ and symmetric all extremities  Skin:   Skin color, texture, turgor normal, no rashes or lesions  Lymph nodes:   Cervical, supraclavicular, and axillary nodes normal  Neurologic:   CNII-XII intact, normal strength, sensation and reflexes    throughout    LABORATORY DATA:  Results for orders placed or performed in visit on 12/22/23 (from the past 48 hours)  CBC with Differential (Cancer Center Only)     Status: Abnormal (Preliminary result)   Collection Time: 12/22/23  8:42 AM  Result Value Ref Range   WBC Count 5.2 4.0 - 10.5 K/uL   RBC 3.79 (L) 3.87 - 5.11 MIL/uL   Hemoglobin 11.2 (L) 12.0 - 15.0 g/dL   HCT 78.2 (L) 95.6 - 21.3 %   MCV 87.6 80.0 - 100.0 fL   MCH 29.6 26.0 - 34.0 pg   MCHC 33.7 30.0 - 36.0 g/dL   RDW 08.6 57.8 - 46.9 %   Platelet Count 323 150 - 400 K/uL   nRBC 0.0 0.0 - 0.2 %    Comment: Performed at [facility], [address] [place], [place] [address]   Neutrophils Relative % PENDING %   Neutro Abs PENDING 1.7 - 7.7 K/uL   Band Neutrophils PENDING %   Lymphocytes Relative PENDING %   Lymphs Abs PENDING 0.7 - 4.0 K/uL   Monocytes Relative PENDING %   Monocytes Absolute PENDING 0.1 - 1.0 K/uL   Eosinophils Relative PENDING %   Eosinophils Absolute PENDING 0.0 - 0.5 K/uL   Basophils Relative PENDING %   Basophils Absolute PENDING 0.0 - 0.1 K/uL    WBC Morphology PENDING    RBC Morphology PENDING    Smear Review PENDING    Other PENDING %   nRBC PENDING 0 /100 WBC   Metamyelocytes Relative PENDING %   Myelocytes PENDING %   Promyelocytes Relative PENDING %   Blasts PENDING %  Immature Granulocytes PENDING %   Abs Immature Granulocytes PENDING 0.00 - 0.07 K/uL  Reticulocytes     Status: Abnormal   Collection Time: 12/22/23  8:42 AM  Result Value Ref Range   Retic Ct Pct 1.4 0.4 - 3.1 %   RBC. 3.82 (L) 3.87 - 5.11 MIL/uL   Retic Count, Absolute 53.5 19.0 - 186.0 K/uL   Immature Retic Fract 9.7 2.3 - 15.9 %    Comment: Performed at [facility], [address] [place], [place] [address]      RADIOGRAPHY: No results found.     PATHOLOGY: None   ASSESSMENT/PLAN: Ms. Savahe is a pleasant 28 yo caucasian female with history of IDA secondary to inadequate dietary intake.  We will get her set up for 1 doses of IV iron  with Monoferric.  Follow-up in 6 weeks.   All questions were answered. The patient knows to call the clinic with any problems, questions or concerns. We can certainly see the patient much sooner if necessary.   Kennard Pea, NP

## 2023-12-28 ENCOUNTER — Inpatient Hospital Stay

## 2023-12-28 VITALS — BP 113/66 | HR 77 | Temp 98.5°F | Resp 17

## 2023-12-28 DIAGNOSIS — D508 Other iron deficiency anemias: Secondary | ICD-10-CM | POA: Diagnosis not present

## 2023-12-28 DIAGNOSIS — Z806 Family history of leukemia: Secondary | ICD-10-CM | POA: Diagnosis not present

## 2023-12-28 DIAGNOSIS — Z79899 Other long term (current) drug therapy: Secondary | ICD-10-CM | POA: Diagnosis not present

## 2023-12-28 DIAGNOSIS — D509 Iron deficiency anemia, unspecified: Secondary | ICD-10-CM

## 2023-12-28 MED ORDER — SODIUM CHLORIDE 0.9 % IV SOLN
1000.0000 mg | Freq: Once | INTRAVENOUS | Status: AC
  Administered 2023-12-28: 1000 mg via INTRAVENOUS
  Filled 2023-12-28: qty 10

## 2023-12-28 MED ORDER — SODIUM CHLORIDE 0.9 % IV SOLN
INTRAVENOUS | Status: DC
Start: 2023-12-28 — End: 2023-12-28

## 2023-12-28 NOTE — Patient Instructions (Signed)

## 2024-01-02 ENCOUNTER — Telehealth: Payer: Self-pay

## 2024-01-02 NOTE — Telephone Encounter (Signed)
 Patient called stating she has a fever of 102 and cold like symptoms, inquiring if it was from her iron  she received last week.  Called patient back and informed her it sounded like she has something unrelated to her iron  infusion and to try over the counter meds and follow up with PCP if symptoms worsened. Pt verbalized understanding

## 2024-01-03 ENCOUNTER — Telehealth: Payer: Self-pay

## 2024-01-03 ENCOUNTER — Other Ambulatory Visit (HOSPITAL_COMMUNITY): Payer: Self-pay

## 2024-01-03 DIAGNOSIS — R5382 Chronic fatigue, unspecified: Secondary | ICD-10-CM

## 2024-01-03 DIAGNOSIS — G4719 Other hypersomnia: Secondary | ICD-10-CM

## 2024-01-03 NOTE — Telephone Encounter (Signed)
 Please note I have spoke with the patients Pharmacy and MD office and made them aware an Authorization was previously done and I have spoke to the Pharmacy to have them fill for [facility]). The pharmacy did inform me that this Rx will need to be ordered.  Nothing further needed.

## 2024-01-03 NOTE — Telephone Encounter (Signed)
 Office received a fax from [employer] for Armodafinil  50 mg   Key: [id number]

## 2024-01-04 MED ORDER — NUVIGIL 50 MG PO TABS: 50.0000 mg | ORAL_TABLET | Freq: Every day | ORAL | 0 refills | Status: DC

## 2024-01-04 NOTE — Addendum Note (Signed)
 Addended by: SEBASTIAN RIGHTER B on: 01/04/2024 10:13 AM   Modules accepted: Orders

## 2024-01-11 DIAGNOSIS — B9689 Other specified bacterial agents as the cause of diseases classified elsewhere: Secondary | ICD-10-CM | POA: Diagnosis not present

## 2024-01-11 DIAGNOSIS — J019 Acute sinusitis, unspecified: Secondary | ICD-10-CM | POA: Diagnosis not present

## 2024-01-16 ENCOUNTER — Ambulatory Visit: Admitting: Family Medicine

## 2024-01-18 DIAGNOSIS — F319 Bipolar disorder, unspecified: Secondary | ICD-10-CM | POA: Diagnosis not present

## 2024-01-18 DIAGNOSIS — F4312 Post-traumatic stress disorder, chronic: Secondary | ICD-10-CM | POA: Diagnosis not present

## 2024-01-19 ENCOUNTER — Ambulatory Visit: Admitting: Family Medicine

## 2024-01-19 ENCOUNTER — Encounter: Payer: Self-pay | Admitting: Family Medicine

## 2024-01-19 VITALS — BP 90/60 | HR 99 | Temp 97.8°F | Ht 65.0 in | Wt 143.0 lb

## 2024-01-19 DIAGNOSIS — R5382 Chronic fatigue, unspecified: Secondary | ICD-10-CM

## 2024-01-19 DIAGNOSIS — F4312 Post-traumatic stress disorder, chronic: Secondary | ICD-10-CM | POA: Diagnosis not present

## 2024-01-19 DIAGNOSIS — G4719 Other hypersomnia: Secondary | ICD-10-CM | POA: Diagnosis not present

## 2024-01-19 DIAGNOSIS — D509 Iron deficiency anemia, unspecified: Secondary | ICD-10-CM

## 2024-01-19 DIAGNOSIS — F319 Bipolar disorder, unspecified: Secondary | ICD-10-CM | POA: Diagnosis not present

## 2024-01-19 MED ORDER — NUVIGIL 50 MG PO TABS
50.0000 mg | ORAL_TABLET | Freq: Every day | ORAL | 0 refills | Status: DC
Start: 2024-01-19 — End: 2024-02-16

## 2024-01-19 NOTE — Patient Instructions (Signed)
   Referring To Provider Information [facility] [address] [place] [address] [phone]

## 2024-01-19 NOTE — Progress Notes (Signed)
 Assessment & Plan   Assessment/Plan:   Assessment & Plan Chronic Fatigue, multifactorial Chronic fatigue likely related to low red blood cell count and functional iron  deficiency, Caregiver stress, and sedative properties of appetite. Despite normal iron  levels, symptoms persist, indicating a possible functional deficiency. Hematologist recommended continuing iron  transfusion due to symptomatic presentation. Nuvigil  (armodafinil ) was prescribed to help with fatigue but has not been started yet. She is on a high dose of quetiapine , which may contribute to fatigue. The decision to start Nuvigil  is based on the need to address fatigue while considering her hypotension and well-managed BMI. - Start Nuvigil  (armodafinil ) 50 mg in the morning to help with fatigue - Continue oral iron  supplementation - Proceed with iron  transfusion as recommended by hematologist - Follow up in one month to assess response to treatment and adjust as necessary  IDA with erythropenia Low red blood cell count identified in recent blood work. Iron  levels are normal after iron  supplementation, but symptoms suggest a functional deficiency. Hematologist has recommended iron  transfusion despite normal iron  levels. The transfusion is considered the better approach to address the functional deficiency and improve symptoms. - Proceed with iron  transfusion as recommended by hematologist - Monitor blood work to assess red blood cell count and iron  levels  Referral to Gastroenterology Referral to gastroenterology was made to rule out gastrointestinal bleeding as a cause of low red blood cell count. Hemoccult test was negative, and she does not have heavy menses, which are common causes of blood loss in young women. The referral process has been delayed, and she is advised to contact [facility] directly to schedule an appointment. - Contact [facility] to schedule appointment for evaluation of potential gastrointestinal  bleeding  Bipolar 1/OCD management with Quetiapine  se She is on a high dose of quetiapine  (300 mg), which may contribute to fatigue. She has been on quetiapine  since age 5 and has reduced the dose from 600 mg over the years. Psychiatric management is ongoing. The decision to maintain the current dose is based on her size and the need to avoid overstimulation. - Continue current dose of quetiapine  (300 mg) - Monitor for side effects and adjust as necessary in collaboration with psychiatry  Caregiver Stress She is experiencing caregiver stress due to caring for a child with autism. This stress may contribute to overall fatigue and mental health challenges. Acknowledging the stress and providing support is essential. - Acknowledge caregiver stress and provide support as needed - Consider discussing stress management strategies in future visits      Medications Discontinued During This Encounter  Medication Reason   NUVIGIL  50 MG tablet Patient Preference    Return in about 1 month (around 02/19/2024) for fatigue .        Subjective:   Encounter date: 01/19/2024  Julie [name] is a 28 y.o. female who has Acne; Bipolar I disorder, moderate, current or most recent episode manic, in partial remission, with anxious distress (HCC); Generalized anxiety disorder; Obsessive compulsive disorder; Encounter for initial prescription of implantable subdermal contraceptive; Nexplanon  in place; Chronic fatigue; Joint swelling; Caregiver stress; History of substance abuse (HCC); Elevated C-reactive protein (CRP); Elevated sed rate; and Iron  deficiency anemia on their problem list..   She  has a past medical history of Anxiety, Asthma, Depression, and Hypertension..   She presents with chief complaint of Follow-up (60mo; feeling the same since last visit; never picked up medication prescribed; had transfusion done about 2wks ago) .   Discussed the use of AI  scribe software for clinical note  transcription with the patient, who gave verbal consent to proceed.  History of Present Illness Julie [name] is a 28 year old female who presents with follow-up on her fatigue management.  She experiences ongoing chronic fatigue and has not started taking the prescribed Provigil as she was able to see a hematologist sooner than expected. She is currently undergoing treatment for low red blood cells, having started iron  supplementation two weeks ago, but has not yet noticed any improvement in her fatigue.  Recent blood work showed normal iron  levels but low red blood cells. Despite normal iron  levels, she continues with iron  transfusions due to her symptoms and takes oral iron  supplements.  She has a history of taking quetiapine  at a dose of 300 mg, which she has been on since she was 28 years old, previously taking up to 600 mg. She manages caregiver stress as she has a child with autism.  She does not have a period and reports no heavy menstrual bleeding. She is awaiting a referral to a gastroenterologist to rule out gastrointestinal blood loss, although a previous hemoccult test was negative.     ROS  Past Surgical History:  Procedure Laterality Date   INSERTION OF IMPLANON  ROD  04/13/2020       NO PAST SURGERIES      Outpatient Medications Prior to Visit  Medication Sig Dispense Refill   Iron , Ferrous Sulfate , 325 (65 Fe) MG TABS Take 325 mg by mouth in the morning and at bedtime. Take with Vitamin C . 30 tablet 11   KELNOR  1/35 1-35 MG-MCG tablet Take 1 tablet by mouth daily.     lamoTRIgine  (LAMICTAL ) 100 MG tablet Take 100 mg by mouth daily.     QUEtiapine  (SEROQUEL ) 300 MG tablet Take 300 mg by mouth at bedtime.     NUVIGIL  50 MG tablet Take 1 tablet (50 mg total) by mouth daily. 30 tablet 0   No facility-administered medications prior to visit.    Family History  Problem Relation Age of Onset   Healthy Mother    Bipolar disorder Father    Alcohol abuse Paternal  Grandmother     Social History   Socioeconomic History   Marital status: Single    Spouse name: Not on file   Number of children: Not on file   Years of education: Not on file   Highest education level: Some college, no degree  Occupational History   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 [employer]  Tobacco Use   Smoking status: Never    Passive exposure: Never   Smokeless tobacco: Never  Vaping Use   Vaping status: Never Used  Substance and Sexual Activity   Alcohol use: No    Comment: hx of substance abuse.Denies all now   Drug use: Not Currently    Comment: History of drug use/clean 3 months   Sexual activity: Yes    Partners: Male    Birth control/protection: Pill, None  Other Topics Concern   Not on file  Social History Narrative   Not on file   Social Drivers of Health   Financial Resource Strain: Low Risk  (05/16/2023)   Overall Financial Resource Strain (CARDIA)    Difficulty of Paying Living Expenses: Not very hard  Food Insecurity: No Food Insecurity (05/16/2023)   Hunger Vital Sign    Worried About Running Out of Food in the Last Year: Never true  Ran Out of Food in the Last Year: Never true  Transportation Needs: No Transportation Needs (05/16/2023)   PRAPARE - Administrator, Civil Service (Medical): No    Lack of Transportation (Non-Medical): No  Physical Activity: Inactive (12/22/2023)   Exercise Vital Sign    Days of Exercise per Week: 0 days    Minutes of Exercise per Session: 0 min  Stress: Stress Concern Present (12/14/2023)   [employer] of Occupational Health - Occupational Stress Questionnaire    Feeling of Stress: To some extent  Social Connections: Socially Isolated (12/22/2023)   Social Connection and Isolation Panel    Frequency of Communication with Friends and Family: Never    Frequency of Social Gatherings with Friends and Family: Never    Attends Religious Services: Never     Database administrator or Organizations: No    Attends Banker Meetings: Never    Marital Status: Never married  Intimate Partner Violence: Not At Risk (12/22/2023)   Humiliation, Afraid, Rape, and Kick questionnaire    Fear of Current or Ex-Partner: No    Emotionally Abused: No    Physically Abused: No    Sexually Abused: No                                                                                                  Objective:  Physical Exam: BP 90/60   Pulse 99   Temp 97.8 F (36.6 C)   Ht 5' 5 (1.651 m)   Wt 143 lb (64.9 kg)   SpO2 98%   BMI 23.80 kg/m    Physical Exam VITALS: BP- 90/60 MEASUREMENTS: BMI- 23.0. GENERAL: Alert, cooperative, well developed, no acute distress HEENT: Normocephalic, normal oropharynx, moist mucous membranes CHEST: Clear to auscultation bilaterally, no wheezes, rhonchi, or crackles CARDIOVASCULAR: Normal heart rate and rhythm, S1 and S2 normal without murmurs ABDOMEN: Soft, non-tender, non-distended, without organomegaly, normal bowel sounds EXTREMITIES: No cyanosis or edema NEUROLOGICAL: Cranial nerves grossly intact, moves all extremities without gross motor or sensory deficit   Physical Exam  MR Angiogram Neck W or Wo Contrast Result Date: 11/14/2023 CLINICAL DATA:  Transient ischemic attack EXAM: MRI HEAD WITHOUT AND WITH CONTRAST MRA HEAD WITHOUT CONTRAST MRA NECK WITHOUT AND WITH CONTRAST TECHNIQUE: Multiplanar, multiecho pulse sequences of the brain and surrounding structures were obtained without and with intravenous contrast. Angiographic images of the Circle of Willis were obtained using MRA technique without intravenous contrast. Angiographic images of the neck were obtained using MRA technique without and with intravenous contrast. Carotid stenosis measurements (when applicable) are obtained utilizing NASCET criteria, using the distal internal carotid diameter as the denominator. CONTRAST:  6mL GADAVIST  GADOBUTROL  1  MMOL/ML IV SOLN COMPARISON:  None Available. FINDINGS: MRI HEAD FINDINGS Brain: No acute infarct, mass effect or extra-axial collection. No acute or chronic hemorrhage. Normal white matter signal, parenchymal volume and CSF spaces. The midline structures are normal. There is no abnormal contrast enhancement. Vascular: Normal flow voids. Skull and upper cervical spine: Normal calvarium and skull base. Visualized upper cervical spine and soft tissues are normal.  Sinuses/Orbits:No paranasal sinus fluid levels or advanced mucosal thickening. No mastoid or middle ear effusion. Normal orbits. MRA HEAD FINDINGS POSTERIOR CIRCULATION: Vertebral arteries are normal. No proximal occlusion of the anterior or inferior cerebellar arteries. Basilar artery is normal. Superior cerebellar arteries are normal. Posterior cerebral arteries are normal. ANTERIOR CIRCULATION: Intracranial internal carotid arteries are normal. Anterior cerebral arteries are normal. Middle cerebral arteries are normal. Anatomic Variants: None MRA NECK FINDINGS Both carotid systems and the codominant vertebral arteries are normal. Normal 3 vessel branching pattern of the aortic arch. Visualized subclavian arteries are patent. IMPRESSION: 1. Normal MRI of the brain. 2. Normal MRA of the head and neck. Electronically Signed   By: Franky Stanford M.D.   On: 11/14/2023 00:41   MR Brain W and Wo Contrast Result Date: 11/14/2023 CLINICAL DATA:  Transient ischemic attack EXAM: MRI HEAD WITHOUT AND WITH CONTRAST MRA HEAD WITHOUT CONTRAST MRA NECK WITHOUT AND WITH CONTRAST TECHNIQUE: Multiplanar, multiecho pulse sequences of the brain and surrounding structures were obtained without and with intravenous contrast. Angiographic images of the Circle of Willis were obtained using MRA technique without intravenous contrast. Angiographic images of the neck were obtained using MRA technique without and with intravenous contrast. Carotid stenosis measurements (when  applicable) are obtained utilizing NASCET criteria, using the distal internal carotid diameter as the denominator. CONTRAST:  6mL GADAVIST  GADOBUTROL  1 MMOL/ML IV SOLN COMPARISON:  None Available. FINDINGS: MRI HEAD FINDINGS Brain: No acute infarct, mass effect or extra-axial collection. No acute or chronic hemorrhage. Normal white matter signal, parenchymal volume and CSF spaces. The midline structures are normal. There is no abnormal contrast enhancement. Vascular: Normal flow voids. Skull and upper cervical spine: Normal calvarium and skull base. Visualized upper cervical spine and soft tissues are normal. Sinuses/Orbits:No paranasal sinus fluid levels or advanced mucosal thickening. No mastoid or middle ear effusion. Normal orbits. MRA HEAD FINDINGS POSTERIOR CIRCULATION: Vertebral arteries are normal. No proximal occlusion of the anterior or inferior cerebellar arteries. Basilar artery is normal. Superior cerebellar arteries are normal. Posterior cerebral arteries are normal. ANTERIOR CIRCULATION: Intracranial internal carotid arteries are normal. Anterior cerebral arteries are normal. Middle cerebral arteries are normal. Anatomic Variants: None MRA NECK FINDINGS Both carotid systems and the codominant vertebral arteries are normal. Normal 3 vessel branching pattern of the aortic arch. Visualized subclavian arteries are patent. IMPRESSION: 1. Normal MRI of the brain. 2. Normal MRA of the head and neck. Electronically Signed   By: Franky Stanford M.D.   On: 11/14/2023 00:41   MR ANGIO HEAD WO CONTRAST Result Date: 11/14/2023 CLINICAL DATA:  Transient ischemic attack EXAM: MRI HEAD WITHOUT AND WITH CONTRAST MRA HEAD WITHOUT CONTRAST MRA NECK WITHOUT AND WITH CONTRAST TECHNIQUE: Multiplanar, multiecho pulse sequences of the brain and surrounding structures were obtained without and with intravenous contrast. Angiographic images of the Circle of Willis were obtained using MRA technique without intravenous  contrast. Angiographic images of the neck were obtained using MRA technique without and with intravenous contrast. Carotid stenosis measurements (when applicable) are obtained utilizing NASCET criteria, using the distal internal carotid diameter as the denominator. CONTRAST:  6mL GADAVIST  GADOBUTROL  1 MMOL/ML IV SOLN COMPARISON:  None Available. FINDINGS: MRI HEAD FINDINGS Brain: No acute infarct, mass effect or extra-axial collection. No acute or chronic hemorrhage. Normal white matter signal, parenchymal volume and CSF spaces. The midline structures are normal. There is no abnormal contrast enhancement. Vascular: Normal flow voids. Skull and upper cervical spine: Normal calvarium and skull base. Visualized upper cervical spine  and soft tissues are normal. Sinuses/Orbits:No paranasal sinus fluid levels or advanced mucosal thickening. No mastoid or middle ear effusion. Normal orbits. MRA HEAD FINDINGS POSTERIOR CIRCULATION: Vertebral arteries are normal. No proximal occlusion of the anterior or inferior cerebellar arteries. Basilar artery is normal. Superior cerebellar arteries are normal. Posterior cerebral arteries are normal. ANTERIOR CIRCULATION: Intracranial internal carotid arteries are normal. Anterior cerebral arteries are normal. Middle cerebral arteries are normal. Anatomic Variants: None MRA NECK FINDINGS Both carotid systems and the codominant vertebral arteries are normal. Normal 3 vessel branching pattern of the aortic arch. Visualized subclavian arteries are patent. IMPRESSION: 1. Normal MRI of the brain. 2. Normal MRA of the head and neck. Electronically Signed   By: Franky Stanford M.D.   On: 11/14/2023 00:41   CT HEAD CODE STROKE WO CONTRAST Addendum Date: 11/13/2023 ADDENDUM REPORT: 11/13/2023 17:04 ADDENDUM: No evidence of an acute intracranial abnormality. These results were called by telephone at the time of interpretation on 11/13/2023 at 5:04 pm to provider ANDREW TEE , who verbally  acknowledged these results. Electronically Signed   By: Rockey Childs D.O.   On: 11/13/2023 17:04   Result Date: 11/13/2023 CLINICAL DATA:  Code stroke. Neuro deficit, acute, stroke suspected. EXAM: CT HEAD WITHOUT CONTRAST TECHNIQUE: Contiguous axial images were obtained from the base of the skull through the vertex without intravenous contrast. RADIATION DOSE REDUCTION: This exam was performed according to the departmental dose-optimization program which includes automated exposure control, adjustment of the mA and/or kV according to patient size and/or use of iterative reconstruction technique. COMPARISON:  None. FINDINGS: Brain: Cerebral volume is normal. There is no acute intracranial hemorrhage. No demarcated cortical infarct. No extra-axial fluid collection. No evidence of an intracranial mass. No midline shift. Vascular: No hyperdense vessel. Skull: No calvarial fracture or aggressive osseous lesion. Sinuses/Orbits: No mass or acute finding within the imaged orbits. No significant paranasal sinus disease at the imaged levels. ASPECTS (Alberta Stroke Program Early CT Score) - Ganglionic level infarction (caudate, lentiform nuclei, internal capsule, insula, M1-M3 cortex): 7 - Supraganglionic infarction (M4-M6 cortex): 3 Total score (0-10 with 10 being normal): 10 Attempts are being made to reach the ordering provider at this time. IMPRESSION: No evidence of an acute intracranial abnormality. Electronically Signed: By: Rockey Childs D.O. On: 11/13/2023 16:58    Recent Results (from the past 2160 hours)  Ethanol     Status: None   Collection Time: 11/13/23  4:35 PM  Result Value Ref Range   Alcohol, Ethyl (B) <15 <15 mg/dL    Comment: Please note change in reference range. (NOTE) For medical purposes only. Performed at [facility], [address] [place], [place] [address]   Protime-INR     Status: None   Collection Time: 11/13/23  4:35 PM  Result Value Ref Range   Prothrombin Time  12.9 11.4 - 15.2 seconds   INR 1.0 0.8 - 1.2    Comment: (NOTE) INR goal varies based on device and disease states. Performed at [facility], [address] [place], [place] [address]   APTT     Status: None   Collection Time: 11/13/23  4:35 PM  Result Value Ref Range   aPTT 25 24 - 36 seconds    Comment: Performed at [facility], [address] [place], [place] [address]  CBC     Status: None   Collection Time: 11/13/23  4:35 PM  Result Value Ref Range  WBC 5.3 4.0 - 10.5 K/uL   RBC 4.12 3.87 - 5.11 MIL/uL   Hemoglobin 12.0 12.0 - 15.0 g/dL   HCT 63.6 63.9 - 53.9 %   MCV 88.1 80.0 - 100.0 fL   MCH 29.1 26.0 - 34.0 pg   MCHC 33.1 30.0 - 36.0 g/dL   RDW 87.6 88.4 - 84.4 %   Platelets 290 150 - 400 K/uL   nRBC 0.0 0.0 - 0.2 %    Comment: Performed at [facility], [address] [place], [place] [address]  Differential     Status: None   Collection Time: 11/13/23  4:35 PM  Result Value Ref Range   Neutrophils Relative % 43 %   Neutro Abs 2.3 1.7 - 7.7 K/uL   Lymphocytes Relative 47 %   Lymphs Abs 2.5 0.7 - 4.0 K/uL   Monocytes Relative 7 %   Monocytes Absolute 0.4 0.1 - 1.0 K/uL   Eosinophils Relative 2 %   Eosinophils Absolute 0.1 0.0 - 0.5 K/uL   Basophils Relative 1 %   Basophils Absolute 0.0 0.0 - 0.1 K/uL   Immature Granulocytes 0 %   Abs Immature Granulocytes 0.01 0.00 - 0.07 K/uL    Comment: Performed at [facility], [address] [place], [place] [address]  Comprehensive metabolic panel     Status: None   Collection Time: 11/13/23  4:35 PM  Result Value Ref Range   Sodium 139 135 - 145 mmol/L   Potassium 4.4 3.5 - 5.1 mmol/L   Chloride 104 98 - 111 mmol/L   CO2 23 22 - 32 mmol/L   Glucose, Bld 96 70 - 99 mg/dL    Comment: Glucose reference range applies only to samples taken after fasting for at least 8 hours.   BUN 9 6 - 20 mg/dL   Creatinine, Ser 9.31 0.44 - 1.00 mg/dL   Calcium  9.8 8.9 - 10.3 mg/dL    Total Protein 7.2 6.5 - 8.1 g/dL   Albumin 4.4 3.5 - 5.0 g/dL   AST 17 15 - 41 U/L   ALT 16 0 - 44 U/L   Alkaline Phosphatase 61 38 - 126 U/L   Total Bilirubin 0.3 0.0 - 1.2 mg/dL   GFR, Estimated >39 >39 mL/min    Comment: (NOTE) Calculated using the CKD-EPI Creatinine Equation (2021)    Anion gap 12 5 - 15    Comment: Performed at [facility], [address] [place], [place] [address]  CBG monitoring, ED     Status: Abnormal   Collection Time: 11/13/23  4:42 PM  Result Value Ref Range   Glucose-Capillary 100 (H) 70 - 99 mg/dL    Comment: Glucose reference range applies only to samples taken after fasting for at least 8 hours.  Rapid urine drug screen (hospital performed)     Status: None   Collection Time: 11/13/23  5:33 PM  Result Value Ref Range   Opiates NONE DETECTED NONE DETECTED   Cocaine NONE DETECTED NONE DETECTED   Benzodiazepines NONE DETECTED NONE DETECTED   Amphetamines NONE DETECTED NONE DETECTED   Tetrahydrocannabinol NONE DETECTED NONE DETECTED   Barbiturates NONE DETECTED NONE DETECTED   Methadone Scn, Ur NONE DETECTED NONE DETECTED   Fentanyl  NONE DETECTED NONE DETECTED    Comment: (NOTE) Drug Screen for Medical Purposes only. If confirmation is needed for any purpose, notify lab within 5 days. Drug Class  Cutoff (ng/mL) Amphetamine and metabolites 1000 Barbiturate and metabolites 200 Benzodiazepine              200 Opiates and metabolites     300 Cocaine and metabolites     300 THC                         50 Fentanyl                     5 Methadone                   300  Trazodone is metabolized in vivo to several metabolites,  including pharmacologically active m-CPP, which is excreted in the  urine.  Immunoassay screens for amphetamines and MDMA have potential  cross-reactivity with these compounds and may provide false positive  result.  Performed at [facility], [address] [place], [place] [address]    Pregnancy, urine     Status: None   Collection Time: 11/13/23  5:33 PM  Result Value Ref Range   Preg Test, Ur NEGATIVE NEGATIVE    Comment:        THE SENSITIVITY OF THIS METHODOLOGY IS >25 mIU/mL. Performed at [facility], [address] [place], [place] [address]   CBC with Differential (Cancer Center Only)     Status: Abnormal   Collection Time: 12/22/23  8:42 AM  Result Value Ref Range   WBC Count 5.2 4.0 - 10.5 K/uL   RBC 3.79 (L) 3.87 - 5.11 MIL/uL   Hemoglobin 11.2 (L) 12.0 - 15.0 g/dL   HCT 66.7 (L) 63.9 - 53.9 %   MCV 87.6 80.0 - 100.0 fL   MCH 29.6 26.0 - 34.0 pg   MCHC 33.7 30.0 - 36.0 g/dL   RDW 87.8 88.4 - 84.4 %   Platelet Count 323 150 - 400 K/uL   nRBC 0.0 0.0 - 0.2 %   Neutrophils Relative % 21 %   Neutro Abs 1.1 (L) 1.7 - 7.7 K/uL   Lymphocytes Relative 75 %   Lymphs Abs 3.9 0.7 - 4.0 K/uL   Monocytes Relative 2 %   Monocytes Absolute 0.1 0.1 - 1.0 K/uL   Eosinophils Relative 2 %   Eosinophils Absolute 0.1 0.0 - 0.5 K/uL   Basophils Relative 0 %   Basophils Absolute 0.0 0.0 - 0.1 K/uL   RBC Morphology MORPHOLOGY UNREMARKABLE    Smear Review Normal platelet morphology    Abs Immature Granulocytes 0.00 0.00 - 0.07 K/uL   Abnormal Lymphocytes Present PRESENT     Comment: Performed at [facility], [address] [place], [place] [address]  Ferritin     Status: None   Collection Time: 12/22/23  8:42 AM  Result Value Ref Range   Ferritin 58 11 - 307 ng/mL    Comment: Performed at [facility], [address], [place], [place] [address]  Iron  and Iron  Binding Capacity ([facility] only)     Status: Abnormal   Collection Time: 12/22/23  8:42 AM  Result Value Ref Range   Iron  92 28 - 170 ug/dL   TIBC 526 (H) 749 - 549 ug/dL   Saturation Ratios 19 10.4 - 31.8 %   UIBC 381 148 - 442 ug/dL    Comment: Performed at [facility], [address], [place], [place] [address]  Reticulocytes      Status:  Abnormal   Collection Time: 12/22/23  8:42 AM  Result Value Ref Range   Retic Ct Pct 1.4 0.4 - 3.1 %   RBC. 3.82 (L) 3.87 - 5.11 MIL/uL   Retic Count, Absolute 53.5 19.0 - 186.0 K/uL   Immature Retic Fract 9.7 2.3 - 15.9 %    Comment: Performed at [facility], [address] [place], [place] [address]        Beverley Adine Hummer, MD, MS

## 2024-02-01 DIAGNOSIS — F319 Bipolar disorder, unspecified: Secondary | ICD-10-CM | POA: Diagnosis not present

## 2024-02-01 DIAGNOSIS — F4312 Post-traumatic stress disorder, chronic: Secondary | ICD-10-CM | POA: Diagnosis not present

## 2024-02-02 ENCOUNTER — Encounter: Payer: Self-pay | Admitting: Family

## 2024-02-02 ENCOUNTER — Inpatient Hospital Stay: Admitting: Family

## 2024-02-02 ENCOUNTER — Inpatient Hospital Stay: Attending: Hematology & Oncology

## 2024-02-02 VITALS — BP 118/74 | HR 80 | Temp 98.8°F | Resp 18 | Ht 65.0 in | Wt 144.0 lb

## 2024-02-02 DIAGNOSIS — D509 Iron deficiency anemia, unspecified: Secondary | ICD-10-CM

## 2024-02-02 DIAGNOSIS — D508 Other iron deficiency anemias: Secondary | ICD-10-CM | POA: Diagnosis not present

## 2024-02-02 LAB — IRON AND IRON BINDING CAPACITY (CC-WL,HP ONLY)
Iron: 108 ug/dL (ref 28–170)
Saturation Ratios: 26 % (ref 10.4–31.8)
TIBC: 409 ug/dL (ref 250–450)
UIBC: 301 ug/dL

## 2024-02-02 LAB — CBC WITH DIFFERENTIAL (CANCER CENTER ONLY)
Abs Immature Granulocytes: 0.03 K/uL (ref 0.00–0.07)
Basophils Absolute: 0 K/uL (ref 0.0–0.1)
Basophils Relative: 1 %
Eosinophils Absolute: 0.1 K/uL (ref 0.0–0.5)
Eosinophils Relative: 4 %
HCT: 33.3 % — ABNORMAL LOW (ref 36.0–46.0)
Hemoglobin: 10.9 g/dL — ABNORMAL LOW (ref 12.0–15.0)
Immature Granulocytes: 1 %
Lymphocytes Relative: 47 %
Lymphs Abs: 1.9 K/uL (ref 0.7–4.0)
MCH: 29.4 pg (ref 26.0–34.0)
MCHC: 32.7 g/dL (ref 30.0–36.0)
MCV: 89.8 fL (ref 80.0–100.0)
Monocytes Absolute: 0.2 K/uL (ref 0.1–1.0)
Monocytes Relative: 6 %
Neutro Abs: 1.6 K/uL — ABNORMAL LOW (ref 1.7–7.7)
Neutrophils Relative %: 41 %
Platelet Count: 283 K/uL (ref 150–400)
RBC: 3.71 MIL/uL — ABNORMAL LOW (ref 3.87–5.11)
RDW: 12.4 % (ref 11.5–15.5)
WBC Count: 3.9 K/uL — ABNORMAL LOW (ref 4.0–10.5)
nRBC: 0 % (ref 0.0–0.2)

## 2024-02-02 LAB — RETICULOCYTES
Immature Retic Fract: 6.1 % (ref 2.3–15.9)
RBC.: 3.71 MIL/uL — ABNORMAL LOW (ref 3.87–5.11)
Retic Count, Absolute: 62.3 K/uL (ref 19.0–186.0)
Retic Ct Pct: 1.7 % (ref 0.4–3.1)

## 2024-02-02 LAB — FERRITIN: Ferritin: 540 ng/mL — ABNORMAL HIGH (ref 11–307)

## 2024-02-02 NOTE — Progress Notes (Signed)
 Hematology and Oncology Follow Up Visit  Julie [name] [id number] Apr 29, 1996 27 y.o. 02/02/2024   Principle Diagnosis:  Iron  deficiency anemia secondary to inadequate dietary intake    Current Therapy:   IV iron  as indicated    Interim History:  Julie [name] is here today for follow-up. She notes that her fatigue did not improve after IV iron  but that she has felt more energy since starting Nuvigil .  She did note that her other symptoms improved after receiving the iron .  No fever, chills, n/v, cough, rash, dizziness, SOB, chest pain, palpitations, abdominal pain or changes in bowel or bladder habits.  She has not noted any obvious blood loss. No bruising or petechiae.  With her oral contraception she does not have a cycle.  She has an appointment coming up with GI to rule out andy GI blood loss.  No swelling, tenderness, numbness or tingling in her extremities.  No falls or syncope reported.  Appetite and hydration are good. Weight is stable at 144 lbs.   ECOG Performance Status: 1 - Symptomatic but completely ambulatory  Medications:  Allergies as of 02/02/2024   No Known Allergies      Medication List        Accurate as of February 02, 2024 10:22 AM. If you have any questions, ask your nurse or doctor.          Iron  (Ferrous Sulfate ) 325 (65 Fe) MG Tabs Take 325 mg by mouth in the morning and at bedtime. Take with Vitamin C .   Kelnor  1/35 1-35 MG-MCG tablet Generic drug: ethynodiol-ethinyl estradiol  Take 1 tablet by mouth daily.   lamoTRIgine  100 MG tablet Commonly known as: LAMICTAL  Take 100 mg by mouth daily.   Nuvigil  50 MG tablet Generic drug: Armodafinil  Take 1 tablet (50 mg total) by mouth daily.   QUEtiapine  300 MG tablet Commonly known as: SEROQUEL  Take 300 mg by mouth at bedtime.        Allergies: No Known Allergies  Past Medical History, Surgical history, Social history, and Family History were reviewed and updated.  Review of Systems: All  other 10 point review of systems is negative.   Physical Exam:  height is 5' 5 (1.651 m) and weight is 144 lb (65.3 kg). Her oral temperature is 98.8 F (37.1 C). Her blood pressure is 118/74 and her pulse is 80. Her respiration is 18 and oxygen saturation is 100%.   Wt Readings from Last 3 Encounters:  02/02/24 144 lb (65.3 kg)  01/19/24 143 lb (64.9 kg)  12/22/23 143 lb 12.8 oz (65.2 kg)    Ocular: Sclerae unicteric, pupils equal, round and reactive to light Ear-nose-throat: Oropharynx clear, dentition fair Lymphatic: No cervical or supraclavicular adenopathy Lungs no rales or rhonchi, good excursion bilaterally Heart regular rate and rhythm, no murmur appreciated Abd soft, nontender, positive bowel sounds MSK no focal spinal tenderness, no joint edema Neuro: non-focal, well-oriented, appropriate affect Breasts: Deferred   Lab Results  Component Value Date   WBC 3.9 (L) 02/02/2024   HGB 10.9 (L) 02/02/2024   HCT 33.3 (L) 02/02/2024   MCV 89.8 02/02/2024   PLT 283 02/02/2024   Lab Results  Component Value Date   FERRITIN 58 12/22/2023   IRON  92 12/22/2023   TIBC 473 (H) 12/22/2023   UIBC 381 12/22/2023   IRONPCTSAT 19 12/22/2023   Lab Results  Component Value Date   RETICCTPCT 1.7 02/02/2024   RBC 3.71 (L) 02/02/2024   RBC 3.71 (L) 02/02/2024  No results found for: JONATHAN BONG [facility] Lab Results  Component Value Date   IGGSERUM 938 10/17/2023   IGMSERUM 75 10/17/2023   No results found for: STEPHANY RINGS, A1GS, MARKEL EARLA JOANNIE DOC VICK, SPEI   Chemistry      Component Value Date/Time   NA 139 11/13/2023 1635   K 4.4 11/13/2023 1635   CL 104 11/13/2023 1635   CO2 23 11/13/2023 1635   BUN 9 11/13/2023 1635   CREATININE 0.68 11/13/2023 1635      Component Value Date/Time   CALCIUM  9.8 11/13/2023 1635   ALKPHOS 61 11/13/2023 1635   AST 17 11/13/2023 1635   ALT 16 11/13/2023 1635   BILITOT  0.3 11/13/2023 1635       Impression and Plan: Julie [name] is a pleasant 28 yo caucasian female with history of IDA secondary to inadequate dietary intake.  We will get her set up for 1 doses of IV iron  with Monoferric .  Follow-up in 3 months.   Lauraine Pepper, NP 8/1/202510:22 AM

## 2024-02-09 ENCOUNTER — Ambulatory Visit: Admitting: Neurology

## 2024-02-14 NOTE — Telephone Encounter (Signed)
 Documenting for previous encounter- we had previously discussed by phone over her results with elevated CRP and iron  deficiency anemia as well as her lites.  At the time orders coordinating for a fecal Hemoccult blood testing to be collected vaginally at her local facility in [facility].  I do not see that this was ever obtained but she has subsequently followed up for her iron  deficiency with primary care office appropriately.  Had not seen any other specific signs of an inflammatory process during her visit so would not need scheduled follow-up specific to our office unless new symptoms arise.

## 2024-02-15 DIAGNOSIS — F319 Bipolar disorder, unspecified: Secondary | ICD-10-CM | POA: Diagnosis not present

## 2024-02-15 DIAGNOSIS — F4312 Post-traumatic stress disorder, chronic: Secondary | ICD-10-CM | POA: Diagnosis not present

## 2024-02-16 ENCOUNTER — Encounter: Payer: Self-pay | Admitting: Family Medicine

## 2024-02-16 ENCOUNTER — Ambulatory Visit: Admitting: Family Medicine

## 2024-02-16 ENCOUNTER — Ambulatory Visit: Admitting: Neurology

## 2024-02-16 VITALS — BP 106/78 | HR 94 | Temp 97.6°F | Resp 18 | Wt 145.2 lb

## 2024-02-16 DIAGNOSIS — G4719 Other hypersomnia: Secondary | ICD-10-CM

## 2024-02-16 DIAGNOSIS — R5382 Chronic fatigue, unspecified: Secondary | ICD-10-CM | POA: Diagnosis not present

## 2024-02-16 DIAGNOSIS — F3173 Bipolar disorder, in partial remission, most recent episode manic: Secondary | ICD-10-CM

## 2024-02-16 DIAGNOSIS — F428 Other obsessive-compulsive disorder: Secondary | ICD-10-CM | POA: Diagnosis not present

## 2024-02-16 DIAGNOSIS — Z975 Presence of (intrauterine) contraceptive device: Secondary | ICD-10-CM

## 2024-02-16 DIAGNOSIS — D508 Other iron deficiency anemias: Secondary | ICD-10-CM

## 2024-02-16 DIAGNOSIS — Z636 Dependent relative needing care at home: Secondary | ICD-10-CM

## 2024-02-16 DIAGNOSIS — Z5181 Encounter for therapeutic drug level monitoring: Secondary | ICD-10-CM | POA: Diagnosis not present

## 2024-02-16 DIAGNOSIS — F411 Generalized anxiety disorder: Secondary | ICD-10-CM

## 2024-02-16 MED ORDER — NUVIGIL 50 MG PO TABS: 50.0000 mg | ORAL_TABLET | Freq: Every day | ORAL | 1 refills | Status: DC

## 2024-02-16 NOTE — Progress Notes (Signed)
 Assessment & Plan   Assessment/Plan:    Assessment & Plan Iron  deficiency anemia Iron  deficiency anemia is contributing to chronic fatigue.  Managed with hematology and iron  infusions  Bipolar type I/OCD and  Follows with psychiatry.  Behavioral health likely component to her chronic fatigue.  However, current mood improved and is well-managed with quetiapine  300 mg nightly no changes in mood or increased irritability. No new symptoms of anxiety or depression.  Caregiver stress/Sleep Deprivation Caregiver stress and lack of sleep from caring for son with severe autism spectrum disorder is a contributing factor to chronic fatigue.  Medication side effects from quetiapine  Quetiapine  300 mg is likely contributing to chronic fatigue. No new side effects reported.  Chronic fatigue due to iron  deficiency anemia, caregiver stress, medication side effects, depression, and anxiety Chronic fatigue is multifactorial, attributed to iron  deficiency anemia, caregiver stress, sleep deprivation, medication side effects from quetiapine , and mood disorders .  Blood pressure and weight are stable armodafinil  50 mg daily has improved symptoms with manageable headaches as the only side effect. - Continue armodafinil  50 mg daily - Perform urine drug test as per policy - Obtain signed agreement regarding controlled substance use - Follow up in 3 months to assess progress      Medications Discontinued During This Encounter  Medication Reason   NUVIGIL  50 MG tablet Reorder    Return in about 3 months (around 05/18/2024) for fatigue.        Subjective:   Encounter date: 02/16/2024  Julie [name] is a 28 y.o. female who has Acne; Bipolar I disorder, moderate, current or most recent episode manic, in partial remission, with anxious distress (HCC); Generalized anxiety disorder; Obsessive compulsive disorder; Encounter for initial prescription of implantable subdermal contraceptive; Nexplanon  in  place; Chronic fatigue; Joint swelling; Caregiver stress; History of substance abuse (HCC); Elevated C-reactive protein (CRP); Elevated sed rate; and Iron  deficiency anemia on their problem list..   She  has a past medical history of Anxiety, Asthma, Depression, and Hypertension..   She presents with chief complaint of Fatigue (Pt is not fasting today; 1 month follow up. Pt stated symptoms have improved with RX that was prescribed //HM due- vaccinations( patient declined for today) OBGYN referral is needed for cervical screening ) .   Discussed the use of AI scribe software for clinical note transcription with the patient, who gave verbal consent to proceed.  History of Present Illness Julie [name] is a 28 year old female who presents with chronic fatigue.  She is experiencing chronic fatigue attributed to iron  deficiency anemia, caregiver stress, medication side effects from quetiapine , and mood disorders including depression and anxiety. She was started on armodafinil  50 mg daily, which has significantly improved her symptoms, describing it as 'a bit of pep in your step'.  She reports feeling better since starting Luludase, noting improvement from day one. She experiences occasional headaches, which she attributes to the medication, but denies dizziness or other side effects. She manages the headaches with ibuprofen  as needed.  She reports no changes in mood or increased irritability. No chest pain, shortness of breath, palpitations, or sleep disturbances.     ROS  Past Surgical History:  Procedure Laterality Date   INSERTION OF IMPLANON  ROD  04/13/2020       NO PAST SURGERIES      Outpatient Medications Prior to Visit  Medication Sig Dispense Refill   Iron , Ferrous Sulfate , 325 (65 Fe) MG TABS Take 325 mg by mouth in the morning  and at bedtime. Take with Vitamin C . 30 tablet 11   KELNOR  1/35 1-35 MG-MCG tablet Take 1 tablet by mouth daily.     lamoTRIgine  (LAMICTAL ) 100 MG  tablet Take 100 mg by mouth daily.     QUEtiapine  (SEROQUEL ) 300 MG tablet Take 300 mg by mouth at bedtime.     NUVIGIL  50 MG tablet Take 1 tablet (50 mg total) by mouth daily. 90 tablet 0   No facility-administered medications prior to visit.    Family History  Problem Relation Age of Onset   Healthy Mother    Bipolar disorder Father    Alcohol abuse Paternal Grandmother     Social History   Socioeconomic History   Marital status: Single    Spouse name: Not on file   Number of children: Not on file   Years of education: Not on file   Highest education level: Some college, no degree  Occupational History   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 [employer]  Tobacco Use   Smoking status: Never    Passive exposure: Never   Smokeless tobacco: Never  Vaping Use   Vaping status: Never Used  Substance and Sexual Activity   Alcohol use: No    Comment: hx of substance abuse.Denies all now   Drug use: Not Currently    Comment: History of drug use/clean 3 months   Sexual activity: Yes    Partners: Male    Birth control/protection: Pill, None  Other Topics Concern   Not on file  Social History Narrative   Not on file   Social Drivers of Health   Financial Resource Strain: Low Risk  (05/16/2023)   Overall Financial Resource Strain (CARDIA)    Difficulty of Paying Living Expenses: Not very hard  Food Insecurity: No Food Insecurity (05/16/2023)   Hunger Vital Sign    Worried About Running Out of Food in the Last Year: Never true    Ran Out of Food in the Last Year: Never true  Transportation Needs: No Transportation Needs (05/16/2023)   PRAPARE - Administrator, Civil Service (Medical): No    Lack of Transportation (Non-Medical): No  Physical Activity: Inactive (12/22/2023)   Exercise Vital Sign    Days of Exercise per Week: 0 days    Minutes of Exercise per Session: 0 min  Stress: Stress Concern Present (12/14/2023)    [employer] of Occupational Health - Occupational Stress Questionnaire    Feeling of Stress: To some extent  Social Connections: Socially Isolated (12/22/2023)   Social Connection and Isolation Panel    Frequency of Communication with Friends and Family: Never    Frequency of Social Gatherings with Friends and Family: Never    Attends Religious Services: Never    Database administrator or Organizations: No    Attends Banker Meetings: Never    Marital Status: Never married  Intimate Partner Violence: Not At Risk (12/22/2023)   Humiliation, Afraid, Rape, and Kick questionnaire    Fear of Current or Ex-Partner: No    Emotionally Abused: No    Physically Abused: No    Sexually Abused: No  Objective:  Physical Exam: BP 106/78 (BP Location: Left Arm, Patient Position: Sitting, Cuff Size: Normal)   Pulse 94   Temp 97.6 F (36.4 C) (Temporal)   Resp 18   Wt 145 lb 3.2 oz (65.9 kg)   SpO2 98%   BMI 24.16 kg/m   Wt Readings from Last 3 Encounters:  02/16/24 145 lb 3.2 oz (65.9 kg)  02/02/24 144 lb (65.3 kg)  01/19/24 143 lb (64.9 kg)    Physical Exam VITALS: BP- 106/78 MEASUREMENTS: Weight- stable. GENERAL: Alert, cooperative, well developed, no acute distress HEENT: Normocephalic, normal oropharynx, moist mucous membranes CHEST: Clear to auscultation bilaterally, no wheezes, rhonchi, or crackles CARDIOVASCULAR: Normal heart rate and rhythm, S1 and S2 normal without murmurs ABDOMEN: Soft, non-tender, non-distended, without organomegaly, normal bowel sounds EXTREMITIES: No cyanosis or edema NEUROLOGICAL: Cranial nerves grossly intact, moves all extremities without gross motor or sensory deficit   Physical Exam  No results found.  Recent Results (from the past 2160 hours)  CBC with Differential (Cancer Center Only)     Status: Abnormal   Collection Time:  12/22/23  8:42 AM  Result Value Ref Range   WBC Count 5.2 4.0 - 10.5 K/uL   RBC 3.79 (L) 3.87 - 5.11 MIL/uL   Hemoglobin 11.2 (L) 12.0 - 15.0 g/dL   HCT 66.7 (L) 63.9 - 53.9 %   MCV 87.6 80.0 - 100.0 fL   MCH 29.6 26.0 - 34.0 pg   MCHC 33.7 30.0 - 36.0 g/dL   RDW 87.8 88.4 - 84.4 %   Platelet Count 323 150 - 400 K/uL   nRBC 0.0 0.0 - 0.2 %   Neutrophils Relative % 21 %   Neutro Abs 1.1 (L) 1.7 - 7.7 K/uL   Lymphocytes Relative 75 %   Lymphs Abs 3.9 0.7 - 4.0 K/uL   Monocytes Relative 2 %   Monocytes Absolute 0.1 0.1 - 1.0 K/uL   Eosinophils Relative 2 %   Eosinophils Absolute 0.1 0.0 - 0.5 K/uL   Basophils Relative 0 %   Basophils Absolute 0.0 0.0 - 0.1 K/uL   RBC Morphology MORPHOLOGY UNREMARKABLE    Smear Review Normal platelet morphology    Abs Immature Granulocytes 0.00 0.00 - 0.07 K/uL   Abnormal Lymphocytes Present PRESENT     Comment: Performed at [facility], [address] [place], [place] [address]  Ferritin     Status: None   Collection Time: 12/22/23  8:42 AM  Result Value Ref Range   Ferritin 58 11 - 307 ng/mL    Comment: Performed at [facility], [address], [place], [place] [address]  Iron  and Iron  Binding Capacity ([facility] only)     Status: Abnormal   Collection Time: 12/22/23  8:42 AM  Result Value Ref Range   Iron  92 28 - 170 ug/dL   TIBC 526 (H) 749 - 549 ug/dL   Saturation Ratios 19 10.4 - 31.8 %   UIBC 381 148 - 442 ug/dL    Comment: Performed at [facility], [address], [place], [place] [address]  Reticulocytes     Status: Abnormal   Collection Time: 12/22/23  8:42 AM  Result Value Ref Range   Retic Ct Pct 1.4 0.4 - 3.1 %   RBC. 3.82 (L) 3.87 - 5.11 MIL/uL   Retic Count, Absolute 53.5 19.0 - 186.0 K/uL   Immature Retic Fract 9.7 2.3 - 15.9 %    Comment: Performed at [facility], [address] [place], [place] [address]  CBC with Differential (Cancer Center Only)      Status: Abnormal   Collection Time: 02/02/24  9:34 AM  Result Value Ref Range   WBC Count 3.9 (L) 4.0 - 10.5 K/uL   RBC 3.71 (L) 3.87 - 5.11 MIL/uL   Hemoglobin 10.9 (L) 12.0 - 15.0 g/dL   HCT 66.6 (L) 63.9 - 53.9 %   MCV 89.8 80.0 - 100.0 fL   MCH 29.4 26.0 - 34.0 pg   MCHC 32.7 30.0 - 36.0 g/dL   RDW 87.5 88.4 - 84.4 %   Platelet Count 283 150 - 400 K/uL   nRBC 0.0 0.0 - 0.2 %   Neutrophils Relative % 41 %   Neutro Abs 1.6 (L) 1.7 - 7.7 K/uL   Lymphocytes Relative 47 %   Lymphs Abs 1.9 0.7 - 4.0 K/uL   Monocytes Relative 6 %   Monocytes Absolute 0.2 0.1 - 1.0 K/uL   Eosinophils Relative 4 %   Eosinophils Absolute 0.1 0.0 - 0.5 K/uL   Basophils Relative 1 %   Basophils Absolute 0.0 0.0 - 0.1 K/uL   Immature Granulocytes 1 %   Abs Immature Granulocytes 0.03 0.00 - 0.07 K/uL    Comment: Performed at [facility], [address] [place], [place] [address]  Ferritin     Status: Abnormal   Collection Time: 02/02/24  9:34 AM  Result Value Ref Range   Ferritin 540 (H) 11 - 307 ng/mL    Comment: Performed at [facility], [address] [place], [place] [address]  Iron  and Iron  Binding Capacity ([facility] only)     Status: None   Collection Time: 02/02/24  9:34 AM  Result Value Ref Range   Iron  108 28 - 170 ug/dL   TIBC 590 749 - 549 ug/dL   Saturation Ratios 26 10.4 - 31.8 %   UIBC 301 ug/dL    Comment: Performed at [facility], [address] [place], [place] [address]  Reticulocytes     Status: Abnormal   Collection Time: 02/02/24  9:34 AM  Result Value Ref Range   Retic Ct Pct 1.7 0.4 - 3.1 %   RBC. 3.71 (L) 3.87 - 5.11 MIL/uL   Retic Count, Absolute 62.3 19.0 - 186.0 K/uL   Immature Retic Fract 6.1 2.3 - 15.9 %    Comment: Performed at [facility], [address] [place], [place] [address]        Julie Adine Hummer, MD, MS

## 2024-02-16 NOTE — Patient Instructions (Signed)
  VISIT SUMMARY: Today, we discussed your chronic fatigue, which is being caused by several factors including iron  deficiency anemia, caregiver stress, medication side effects from quetiapine , and mood disorders such as depression and anxiety. You have started taking armodafinil  50 mg daily, which has significantly improved your symptoms. You also reported occasional headaches, which you manage with ibuprofen .  YOUR PLAN: -IRON  DEFICIENCY ANEMIA: Iron  deficiency anemia is a condition where your body lacks enough iron  to produce healthy red blood cells, leading to fatigue. We will continue to monitor your condition and ensure you are getting enough iron  in your diet.  -DEPRESSION AND ANXIETY DISORDERS: Depression and anxiety are mood disorders that can contribute to feelings of fatigue. Your current mood is well-managed, and there are no new symptoms. We will continue with your current treatment plan.  -CAREGIVER STRESS: Caregiver stress is the physical and emotional strain of caregiving, which can contribute to fatigue. We will continue to monitor this and provide support as needed.  -MEDICATION SIDE EFFECTS FROM QUETIAPINE : Quetiapine  is a medication that can cause side effects such as fatigue. You have not reported any new side effects, so we will continue with the current plan.  -CHRONIC FATIGUE: Chronic fatigue is a long-term tiredness that does not improve with rest and is caused by multiple factors including iron  deficiency anemia, caregiver stress, medication side effects, and mood disorders. Armodafinil  50 mg daily has been effective in improving your symptoms, and we will continue this medication. You should also perform a urine drug test as per policy and sign an agreement regarding controlled substance use.  INSTRUCTIONS: Please continue taking armodafinil  50 mg daily. Perform a urine drug test as per policy and sign the agreement regarding controlled substance use. Follow up in 3 months to  assess your progress.                      Contains text generated by Abridge.                                 Contains text generated by Abridge.

## 2024-02-20 ENCOUNTER — Ambulatory Visit: Payer: Self-pay | Admitting: Family Medicine

## 2024-02-20 LAB — TOXASSURE FLEX 24, UR
6-ACETYLMORPHINE IA: NEGATIVE ng/mL
7-aminoclonazepam: NOT DETECTED ng/mg{creat}
AMPHETAMINES IA: NEGATIVE ng/mL
Alpha-hydroxyalprazolam: NOT DETECTED ng/mg{creat}
Alpha-hydroxymidazolam: NOT DETECTED ng/mg{creat}
Alpha-hydroxytriazolam: NOT DETECTED ng/mg{creat}
Alprazolam: NOT DETECTED ng/mg{creat}
Amino Chloropyridine: NOT DETECTED
BARBITURATES IA: NEGATIVE ng/mL
Buprenorphine: NOT DETECTED ng/mg{creat}
CANNABINOIDS IA: NEGATIVE ng/mL
CARISOPRODOL IA: NEGATIVE ng/mL
COCAINE METABOLITE IA: NEGATIVE ng/mL
Clonazepam: NOT DETECTED ng/mg{creat}
Creatinine: 165 mg/dL (ref >=20)
Desalkylflurazepam: NOT DETECTED ng/mg{creat}
Desmethyldiazepam: NOT DETECTED ng/mg{creat}
Desmethylflunitrazepam: NOT DETECTED ng/mg{creat}
Dextromethorphan: NOT DETECTED
Dextrorphan/Levorphanol: NOT DETECTED
Diazepam: NOT DETECTED ng/mg{creat}
ETHANOL BIOMARKERS IA: NEGATIVE ng/mL
ETHYL ALCOHOL Enzymatic: NEGATIVE g/dL
Fentanyl: NOT DETECTED ng/mg{creat}
Flunitrazepam: NOT DETECTED ng/mg{creat}
Ketamine: NOT DETECTED
Lorazepam: NOT DETECTED ng/mg{creat}
MEPERIDINE IA: NEGATIVE ng/mL
METHADONE IA: NEGATIVE ng/mL
METHADONE MTB IA: NEGATIVE ng/mL
METHYLPHENIDATE IA: NEGATIVE ng/mL
Midazolam: NOT DETECTED ng/mg{creat}
Norbuprenorphine: NOT DETECTED ng/mg{creat}
Norfentanyl: NOT DETECTED ng/mg{creat}
Norketamine: NOT DETECTED
OPIATE CLASS IA: NEGATIVE ng/mL
OTHER HALLUCINOGENS: NEGATIVE
OXYCODONE CLASS IA: NEGATIVE ng/mL
Oxazepam: NOT DETECTED ng/mg{creat}
PHENCYCLIDINE IA: NEGATIVE ng/mL
PROPOXYPHENE IA: NEGATIVE ng/mL
TAPENTADOL, IA: NEGATIVE ng/mL
TRAMADOL IA: NEGATIVE ng/mL
Temazepam: NOT DETECTED ng/mg{creat}
Zaleplon: NOT DETECTED
Zolpidem Acid: NOT DETECTED
Zolpidem: NOT DETECTED
Zopiclone/Eszopiclone: NOT DETECTED

## 2024-03-13 ENCOUNTER — Encounter: Payer: Self-pay | Admitting: Gastroenterology

## 2024-03-14 DIAGNOSIS — F4312 Post-traumatic stress disorder, chronic: Secondary | ICD-10-CM | POA: Diagnosis not present

## 2024-03-14 DIAGNOSIS — F319 Bipolar disorder, unspecified: Secondary | ICD-10-CM | POA: Diagnosis not present

## 2024-03-28 DIAGNOSIS — F319 Bipolar disorder, unspecified: Secondary | ICD-10-CM | POA: Diagnosis not present

## 2024-03-28 DIAGNOSIS — F4312 Post-traumatic stress disorder, chronic: Secondary | ICD-10-CM | POA: Diagnosis not present

## 2024-04-25 DIAGNOSIS — F319 Bipolar disorder, unspecified: Secondary | ICD-10-CM | POA: Diagnosis not present

## 2024-04-25 DIAGNOSIS — F4312 Post-traumatic stress disorder, chronic: Secondary | ICD-10-CM | POA: Diagnosis not present

## 2024-05-07 ENCOUNTER — Encounter: Payer: Self-pay | Admitting: Gastroenterology

## 2024-05-07 ENCOUNTER — Ambulatory Visit: Admitting: Gastroenterology

## 2024-05-07 ENCOUNTER — Other Ambulatory Visit (INDEPENDENT_AMBULATORY_CARE_PROVIDER_SITE_OTHER)

## 2024-05-07 VITALS — BP 110/70 | HR 105 | Ht 65.0 in | Wt 152.5 lb

## 2024-05-07 DIAGNOSIS — D509 Iron deficiency anemia, unspecified: Secondary | ICD-10-CM

## 2024-05-07 DIAGNOSIS — K5900 Constipation, unspecified: Secondary | ICD-10-CM | POA: Diagnosis not present

## 2024-05-07 LAB — CBC WITH DIFFERENTIAL/PLATELET
Basophils Absolute: 0 K/uL (ref 0.0–0.1)
Basophils Relative: 0.5 % (ref 0.0–3.0)
Eosinophils Absolute: 0.1 K/uL (ref 0.0–0.7)
Eosinophils Relative: 2.4 % (ref 0.0–5.0)
HCT: 35.5 % — ABNORMAL LOW (ref 36.0–46.0)
Hemoglobin: 12.1 g/dL (ref 12.0–15.0)
Lymphocytes Relative: 40.2 % (ref 12.0–46.0)
Lymphs Abs: 1.6 K/uL (ref 0.7–4.0)
MCHC: 34.1 g/dL (ref 30.0–36.0)
MCV: 89.2 fl (ref 78.0–100.0)
Monocytes Absolute: 0.3 K/uL (ref 0.1–1.0)
Monocytes Relative: 7.3 % (ref 3.0–12.0)
Neutro Abs: 2 K/uL (ref 1.4–7.7)
Neutrophils Relative %: 49.6 % (ref 43.0–77.0)
Platelets: 272 K/uL (ref 150.0–400.0)
RBC: 3.98 Mil/uL (ref 3.87–5.11)
RDW: 12.4 % (ref 11.5–15.5)
WBC: 4.1 K/uL (ref 4.0–10.5)

## 2024-05-07 LAB — COMPREHENSIVE METABOLIC PANEL WITH GFR
ALT: 11 U/L (ref 0–35)
AST: 10 U/L (ref 0–37)
Albumin: 4.2 g/dL (ref 3.5–5.2)
Alkaline Phosphatase: 45 U/L (ref 39–117)
BUN: 11 mg/dL (ref 6–23)
CO2: 26 meq/L (ref 19–32)
Calcium: 9.1 mg/dL (ref 8.4–10.5)
Chloride: 106 meq/L (ref 96–112)
Creatinine, Ser: 0.66 mg/dL (ref 0.40–1.20)
GFR: 120.43 mL/min (ref >60.00)
Glucose, Bld: 84 mg/dL (ref 70–99)
Potassium: 3.8 meq/L (ref 3.5–5.1)
Sodium: 138 meq/L (ref 135–145)
Total Bilirubin: 0.4 mg/dL (ref 0.2–1.2)
Total Protein: 6.8 g/dL (ref 6.0–8.3)

## 2024-05-07 MED ORDER — NA SULFATE-K SULFATE-MG SULF 17.5-3.13-1.6 GM/177ML PO SOLN: 1.0000 | ORAL | 0 refills | Status: DC

## 2024-05-07 NOTE — Patient Instructions (Signed)
 _______________________________________________________  If your blood pressure at your visit was 140/90 or greater, please contact your primary care physician to follow up on this.  _______________________________________________________  If you are age 27 or older, your body mass index should be between 23-30. Your Body mass index is 25.38 kg/m. If this is out of the aforementioned range listed, please consider follow up with your Primary Care Provider.  If you are age 72 or younger, your body mass index should be between 19-25. Your Body mass index is 25.38 kg/m. If this is out of the aformentioned range listed, please consider follow up with your Primary Care Provider.   ________________________________________________________  The [facility] providers would like to encourage you to use MYCHART to communicate with providers for non-urgent requests or questions.  Due to long hold times on the telephone, sending your provider a message by [facility] may be a faster and more efficient way to get a response.  Please allow 48 business hours for a response.  Please remember that this is for non-urgent requests.  _______________________________________________________  [facility] is using a team-based approach to care.  Your team is made up of your doctor and two to three APPS. Our APPS (Nurse Practitioners and Physician Assistants) work with your physician to ensure care continuity for you. They are fully qualified to address your health concerns and develop a treatment plan. They communicate directly with your gastroenterologist to care for you. Seeing the Advanced Practice Practitioners on your physician's team can help you by facilitating care more promptly, often allowing for earlier appointments, access to diagnostic testing, procedures, and other specialty referrals.   Your provider has requested that you go to the basement level for lab work before leaving today. Press B on the  elevator. The lab is located at the first door on the left as you exit the elevator.  You have been scheduled for an endoscopy and colonoscopy. Please follow the written instructions given to you at your visit today.  If you use inhalers (even only as needed), please bring them with you on the day of your procedure.  DO NOT TAKE 7 DAYS PRIOR TO TEST- Trulicity (dulaglutide) Ozempic, Wegovy (semaglutide) Mounjaro (tirzepatide) Bydureon Bcise (exanatide extended release)  DO NOT TAKE 1 DAY PRIOR TO YOUR TEST Rybelsus (semaglutide) Adlyxin (lixisenatide) Victoza (liraglutide) Byetta (exanatide) ___________________________________________________________________________  Due to recent changes in healthcare laws, you may see the results of your imaging and laboratory studies on MyChart before your provider has had a chance to review them.  We understand that in some cases there may be results that are confusing or concerning to you. Not all laboratory results come back in the same time frame and the provider may be waiting for multiple results in order to interpret others.  Please give us  48 hours in order for your provider to thoroughly review all the results before contacting the office for clarification of your results.   Thank you for entrusting me with your care and choosing [facility].  Nestor Blower, PA-C

## 2024-05-07 NOTE — Progress Notes (Signed)
 Chief Complaint: Chronic IDA Primary GI MD: Sampson  HPI: Discussed the use of AI scribe software for clinical note transcription with the patient, who gave verbal consent to proceed.  Julie [name] is a 28 year old female with iron  deficiency anemia who presents for evaluation of persistent anemia despite treatment. She was referred by her primary care doctor for evaluation of persistent anemia.  She has a history of iron  deficiency anemia since at least 2021. Despite taking iron  supplements, she continues to experience persistent anemia with baseline of hgb 10-11. She follows with hematology. The supplements have caused gastrointestinal side effects, including dark stools, constipation, and occasional diarrhea. She takes the iron  supplement with vitamin C  every other day to aid absorption, but experiences canker sores with acidic foods, limiting her intake of such drinks.  Approximately four months ago, she underwent an iron  transfusion, which improved her iron  levels, but she continues to have low red blood cells. Fatigue remains a significant symptom, which was the initial reason for seeking medical attention. While the iron  supplements have not alleviated her fatigue, a stimulant has been helpful prescribed by PCP.  She does not have menstruation. She reports gastrointestinal symptoms including constipation, diarrhea, and dark stools, which began after starting iron  supplements. No blood in stool. She has not been checked for celiac disease and does not feel she has issues with gluten.  Her family history includes anemia in her mother and grandmother, with all the women on her mother's side experiencing low iron  levels since pregnancy. Her anemia began after pregnancy. She is currently under the care of a hematologist, with a follow-up appointment scheduled next week.  Past lab work is notable for Elevated CRP of 11, ESR 27 (2024)   Past Medical History:  Diagnosis Date    Anxiety    Asthma    Depression    Hypertension     Past Surgical History:  Procedure Laterality Date   INSERTION OF IMPLANON  ROD  04/13/2020       NO PAST SURGERIES      Current Outpatient Medications  Medication Sig Dispense Refill   Na Sulfate-K Sulfate-Mg Sulfate concentrate (SUPREP BOWEL PREP KIT) 17.5-3.13-1.6 GM/177ML SOLN Take 1 kit (354 mLs total) by mouth as directed. 324 mL 0   Iron , Ferrous Sulfate , 325 (65 Fe) MG TABS Take 325 mg by mouth in the morning and at bedtime. Take with Vitamin C . 30 tablet 11   KELNOR  1/35 1-35 MG-MCG tablet Take 1 tablet by mouth daily.     lamoTRIgine  (LAMICTAL ) 100 MG tablet Take 100 mg by mouth daily.     NUVIGIL  50 MG tablet Take 1 tablet (50 mg total) by mouth daily. 90 tablet 1   QUEtiapine  (SEROQUEL ) 300 MG tablet Take 300 mg by mouth at bedtime.     No current facility-administered medications for this visit.    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 [employer]  Tobacco Use   Smoking status: Never    Passive exposure: Never   Smokeless tobacco: Never  Vaping Use   Vaping status: Never Used  Substance and Sexual Activity   Alcohol use: No    Comment: hx of substance abuse.Denies all now   Drug use: Not Currently    Comment: History of drug use/   Sexual activity: Yes    Partners: Male    Birth control/protection: Pill, None   Other Topics Concern   Not on file  Social History Narrative   Not on file   Social Drivers of Health   Financial Resource Strain: Low Risk  (05/16/2023)   Overall Financial Resource Strain (CARDIA)    Difficulty of Paying Living Expenses: Not very hard  Food Insecurity: No Food Insecurity (05/16/2023)   Hunger Vital Sign    Worried About Running Out of Food in the Last Year: Never true    Ran Out of Food in the Last Year: Never true  Transportation Needs: No Transportation Needs (05/16/2023)   PRAPARE - Administrator, Civil Service (Medical): No    Lack of Transportation (Non-Medical): No  Physical Activity: Inactive (12/22/2023)   Exercise Vital Sign    Days of Exercise per Week: 0 days    Minutes of Exercise per Session: 0 min  Stress: Stress Concern Present (12/14/2023)   [employer] of Occupational Health - Occupational Stress Questionnaire    Feeling of Stress: To some extent  Social Connections: Socially Isolated (12/22/2023)   Social Connection and Isolation Panel    Frequency of Communication with Friends and Family: Never    Frequency of Social Gatherings with Friends and Family: Never    Attends Religious Services: Never    Database Administrator or Organizations: No    Attends Banker Meetings: Never    Marital Status: Never married  Intimate Partner Violence: Not At Risk (12/22/2023)   Humiliation, Afraid, Rape, and Kick questionnaire    Fear of Current or Ex-Partner: No    Emotionally Abused: No    Physically Abused: No    Sexually Abused: No    Review of Systems:    Constitutional: No weight loss, fever, chills, weakness or fatigue HEENT: Eyes: No change in vision               Ears, Nose, Throat:  No change in hearing or congestion Skin: No rash or itching Cardiovascular: No chest pain, chest pressure or palpitations   Respiratory: No SOB or cough Gastrointestinal: See HPI and otherwise negative Genitourinary: No dysuria or  change in urinary frequency Neurological: No headache, dizziness or syncope Musculoskeletal: No new muscle or joint pain Hematologic: No bleeding or bruising Psychiatric: No history of depression or anxiety    Physical Exam:  Vital signs: BP 110/70   Pulse (!) 105   Ht 5' 5 (1.651 m)   Wt 152 lb 8 oz (69.2 kg)   SpO2 100%   BMI 25.38 kg/m   Constitutional: NAD, alert and cooperative Head:  Normocephalic and atraumatic. Eyes:   PEERL, EOMI. No icterus. Conjunctiva pink. Respiratory: Respirations even and unlabored. Lungs clear to auscultation bilaterally.   No wheezes, crackles, or rhonchi.  Cardiovascular:  Regular rate and rhythm. No peripheral edema, cyanosis or  pallor.  Gastrointestinal:  Soft, nondistended, nontender. No rebound or guarding. Normal bowel sounds. No appreciable masses or hepatomegaly. Rectal:  Declines Msk:  Symmetrical without gross deformities. Without edema, no deformity or joint abnormality.  Neurologic:  Alert and  oriented x4;  grossly normal neurologically.  Skin:   Dry and intact without significant lesions or rashes. Psychiatric: Oriented to person, place and time. Demonstrates good judgement and reason without abnormal affect or behaviors.   RELEVANT LABS AND IMAGING: CBC    Component Value Date/Time   WBC 3.9 (L) 02/02/2024 0934   WBC 5.3 11/13/2023 1635   RBC 3.71 (L) 02/02/2024 0934   RBC 3.71 (L) 02/02/2024 0934   HGB 10.9 (L) 02/02/2024 0934   HGB 10.0 (L) 01/21/2020 1011   HCT 33.3 (L) 02/02/2024 0934   HCT 31.4 (L) 01/21/2020 1011   PLT 283 02/02/2024 0934   PLT 272 01/21/2020 1011   MCV 89.8 02/02/2024 0934   MCV 91 01/21/2020 1011   MCH 29.4 02/02/2024 0934   MCHC 32.7 02/02/2024 0934   RDW 12.4 02/02/2024 0934   RDW 12.7 01/21/2020 1011   LYMPHSABS 1.9 02/02/2024 0934   LYMPHSABS 2.4 09/17/2019 1456   MONOABS 0.2 02/02/2024 0934   EOSABS 0.1 02/02/2024 0934   EOSABS 0.1 09/17/2019 1456   BASOSABS 0.0 02/02/2024 0934    BASOSABS 0.0 09/17/2019 1456    CMP     Component Value Date/Time   NA 139 11/13/2023 1635   K 4.4 11/13/2023 1635   CL 104 11/13/2023 1635   CO2 23 11/13/2023 1635   GLUCOSE 96 11/13/2023 1635   BUN 9 11/13/2023 1635   CREATININE 0.68 11/13/2023 1635   CALCIUM  9.8 11/13/2023 1635   PROT 7.2 11/13/2023 1635   ALBUMIN 4.4 11/13/2023 1635   AST 17 11/13/2023 1635   ALT 16 11/13/2023 1635   ALKPHOS 61 11/13/2023 1635   BILITOT 0.3 11/13/2023 1635   GFRNONAA >60 11/13/2023 1635   GFRAA >60 03/22/2020 2009     Assessment/Plan:   Chronic iron  deficiency anemia Chronic iron  deficiency anemia since at least 2021 with family history of IDA in mother and grandmother.  No overt bleeding.  Has been on iron  supplementation.  Iron  infusion 4 months ago.  Baseline hgb 10-11. No menstrual cycles. No GI symptoms (other than constipation that began with iron  supplementation). Noted she has previous elevated inflammatory markers 2024 with CRP 11 (high sensitivity)/ESR 27 -- EGD/Colonoscopy -- no previous check for celiac, take biopsies during procedures -- I thoroughly discussed the procedure with the patient (at bedside) to include nature of the procedure, alternatives, benefits, and risks (including but not limited to bleeding, infection, perforation, anesthesia/cardiac pulmonary complications).  Patient verbalized understanding and gave verbal consent to proceed with procedure.  -- recheck labs today including cbc, iron  levels, CMP  Constipation Began with iron  supplementation -- Start taking Miralax 1 capful (17 grams) 1x / day for 1 week.   If this is not effective, increase to 1 dose 2x / day for 1 week.   If this is still not effective, increase to two capfuls (34 grams) 2x / day.   Can adjust dose as needed based on response. Can take 1/2 cap daily, skip days, or increase per day.     Assigned to Dr. Stacia today  Nestor Blower, PA-C [facility] 05/07/2024, 9:18  AM  Cc: Sebastian Beverley NOVAK, MD

## 2024-05-08 LAB — IBC + FERRITIN
Ferritin: 172.7 ng/mL (ref 10.0–291.0)
Iron: 84 ug/dL (ref 42–145)
Saturation Ratios: 20.4 % (ref 20.0–50.0)
TIBC: 411.6 ug/dL (ref 250.0–450.0)
Transferrin: 294 mg/dL (ref 212.0–360.0)

## 2024-05-08 LAB — B12 AND FOLATE PANEL
Folate: 12.6 ng/mL (ref >5.9)
Vitamin B-12: 201 pg/mL — ABNORMAL LOW (ref 211–911)

## 2024-05-09 DIAGNOSIS — F4312 Post-traumatic stress disorder, chronic: Secondary | ICD-10-CM | POA: Diagnosis not present

## 2024-05-09 DIAGNOSIS — F319 Bipolar disorder, unspecified: Secondary | ICD-10-CM | POA: Diagnosis not present

## 2024-05-10 ENCOUNTER — Inpatient Hospital Stay (HOSPITAL_BASED_OUTPATIENT_CLINIC_OR_DEPARTMENT_OTHER): Admitting: Family

## 2024-05-10 ENCOUNTER — Ambulatory Visit: Payer: Self-pay | Admitting: Gastroenterology

## 2024-05-10 ENCOUNTER — Inpatient Hospital Stay: Attending: Hematology & Oncology

## 2024-05-10 VITALS — BP 114/74 | HR 89 | Resp 16 | Wt 152.0 lb

## 2024-05-10 DIAGNOSIS — F319 Bipolar disorder, unspecified: Secondary | ICD-10-CM | POA: Diagnosis not present

## 2024-05-10 DIAGNOSIS — F4312 Post-traumatic stress disorder, chronic: Secondary | ICD-10-CM | POA: Diagnosis not present

## 2024-05-10 DIAGNOSIS — D509 Iron deficiency anemia, unspecified: Secondary | ICD-10-CM

## 2024-05-10 DIAGNOSIS — D508 Other iron deficiency anemias: Secondary | ICD-10-CM | POA: Insufficient documentation

## 2024-05-10 LAB — CBC WITH DIFFERENTIAL (CANCER CENTER ONLY)
Abs Immature Granulocytes: 0.03 K/uL (ref 0.00–0.07)
Basophils Absolute: 0 K/uL (ref 0.0–0.1)
Basophils Relative: 1 %
Eosinophils Absolute: 0.1 K/uL (ref 0.0–0.5)
Eosinophils Relative: 2 %
HCT: 35.5 % — ABNORMAL LOW (ref 36.0–46.0)
Hemoglobin: 12 g/dL (ref 12.0–15.0)
Immature Granulocytes: 1 %
Lymphocytes Relative: 43 %
Lymphs Abs: 1.8 K/uL (ref 0.7–4.0)
MCH: 30.3 pg (ref 26.0–34.0)
MCHC: 33.8 g/dL (ref 30.0–36.0)
MCV: 89.6 fL (ref 80.0–100.0)
Monocytes Absolute: 0.3 K/uL (ref 0.1–1.0)
Monocytes Relative: 7 %
Neutro Abs: 2 K/uL (ref 1.7–7.7)
Neutrophils Relative %: 46 %
Platelet Count: 293 K/uL (ref 150–400)
RBC: 3.96 MIL/uL (ref 3.87–5.11)
RDW: 11.9 % (ref 11.5–15.5)
WBC Count: 4.2 K/uL (ref 4.0–10.5)
nRBC: 0 % (ref 0.0–0.2)

## 2024-05-10 LAB — IRON AND IRON BINDING CAPACITY (CC-WL,HP ONLY)
Iron: 103 ug/dL (ref 28–170)
Saturation Ratios: 24 % (ref 10.4–31.8)
TIBC: 435 ug/dL (ref 250–450)
UIBC: 332 ug/dL

## 2024-05-10 LAB — FERRITIN: Ferritin: 332 ng/mL — ABNORMAL HIGH (ref 11–307)

## 2024-05-10 LAB — RETICULOCYTES
Immature Retic Fract: 5.4 % (ref 2.3–15.9)
RBC.: 3.94 MIL/uL (ref 3.87–5.11)
Retic Count, Absolute: 57.1 K/uL (ref 19.0–186.0)
Retic Ct Pct: 1.5 % (ref 0.4–3.1)

## 2024-05-10 NOTE — Progress Notes (Signed)
 Hematology and Oncology Follow Up Visit  Julie [name] [id number] 1996/04/27 28 y.o. 05/10/2024   Principle Diagnosis:  Iron  deficiency anemia secondary to inadequate dietary intake     Current Therapy:        IV iron  as indicated    Interim History:  Julie [name] is here today for follow-up. She is doing well and has no complaints at this time.  She notes that her energy and stamina are much improved.  No blood loss, bruising or petechiae.  No fever, chills, n/v, cough, rash, dizziness, SOB, chest pain, palpitations, abdominal pain or changes in bowel or bladder habits.  No swelling, tenderness, numbness or tingling in her extremities.  No falls or syncope reported.  Appetite and hydration are good. Weight is stable at 152 lbs.   ECOG Performance Status: 0 - Asymptomatic  Medications:  Allergies as of 05/10/2024   No Known Allergies      Medication List        Accurate as of May 10, 2024 10:38 AM. If you have any questions, ask your nurse or doctor.          Iron  (Ferrous Sulfate ) 325 (65 Fe) MG Tabs Take 325 mg by mouth in the morning and at bedtime. Take with Vitamin C .   Kelnor  1/35 1-35 MG-MCG tablet Generic drug: ethynodiol-ethinyl estradiol  Take 1 tablet by mouth daily.   lamoTRIgine  100 MG tablet Commonly known as: LAMICTAL  Take 100 mg by mouth daily.   Na Sulfate-K Sulfate-Mg Sulfate concentrate 17.5-3.13-1.6 GM/177ML Soln Commonly known as: Suprep Bowel Prep Kit Take 1 kit (354 mLs total) by mouth as directed.   Nuvigil  50 MG tablet Generic drug: Armodafinil  Take 1 tablet (50 mg total) by mouth daily.   QUEtiapine  300 MG tablet Commonly known as: SEROQUEL  Take 300 mg by mouth at bedtime.        Allergies: No Known Allergies  Past Medical History, Surgical history, Social history, and Family History were reviewed and updated.  Review of Systems: All other 10 point review of systems is negative.   Physical Exam:  vitals were not  taken for this visit.   Wt Readings from Last 3 Encounters:  05/07/24 152 lb 8 oz (69.2 kg)  02/16/24 145 lb 3.2 oz (65.9 kg)  02/02/24 144 lb (65.3 kg)    Ocular: Sclerae unicteric, pupils equal, round and reactive to light Ear-nose-throat: Oropharynx clear, dentition fair Lymphatic: No cervical or supraclavicular adenopathy Lungs no rales or rhonchi, good excursion bilaterally Heart regular rate and rhythm, no murmur appreciated Abd soft, nontender, positive bowel sounds MSK no focal spinal tenderness, no joint edema Neuro: non-focal, well-oriented, appropriate affect Breasts: Deferred   Lab Results  Component Value Date   WBC 4.2 05/10/2024   HGB 12.0 05/10/2024   HCT 35.5 (L) 05/10/2024   MCV 89.6 05/10/2024   PLT 293 05/10/2024   Lab Results  Component Value Date   FERRITIN 172.7 05/07/2024   IRON  84 05/07/2024   TIBC 411.6 05/07/2024   UIBC 301 02/02/2024   IRONPCTSAT 20.4 05/07/2024   Lab Results  Component Value Date   RETICCTPCT 1.5 05/10/2024   RBC 3.96 05/10/2024   No results found for: JONATHAN BONG [facility] Lab Results  Component Value Date   IGGSERUM 938 10/17/2023   IGMSERUM 75 10/17/2023   No results found for: TOTALPROTELP, ALBUMINELP, A1GS, A2GS, BETS, BETA2SER, GAMS, MSPIKE, SPEI   Chemistry      Component Value Date/Time   NA 138 05/07/2024 0927  K 3.8 05/07/2024 0927   CL 106 05/07/2024 0927   CO2 26 05/07/2024 0927   BUN 11 05/07/2024 0927   CREATININE 0.66 05/07/2024 0927      Component Value Date/Time   CALCIUM  9.1 05/07/2024 0927   ALKPHOS 45 05/07/2024 0927   AST 10 05/07/2024 0927   ALT 11 05/07/2024 0927   BILITOT 0.4 05/07/2024 0927       Impression and Plan: Julie [name] is a pleasant 28 yo caucasian female with history of IDA secondary to inadequate dietary intake.  Iron  studies are pending. We will replace if needed.  Follow-up in 3 months.   Lauraine Pepper, NP 11/7/202510:38  AM

## 2024-05-15 ENCOUNTER — Telehealth: Payer: Self-pay

## 2024-05-15 NOTE — Progress Notes (Signed)
 Agree with the assessment and plan as outlined by Boone Master, PA-C.

## 2024-05-15 NOTE — Telephone Encounter (Signed)
 Spoke with patient and scheduled a new patient appointment to discuss birth control.

## 2024-05-17 ENCOUNTER — Ambulatory Visit: Admitting: Family Medicine

## 2024-05-23 DIAGNOSIS — F4312 Post-traumatic stress disorder, chronic: Secondary | ICD-10-CM | POA: Diagnosis not present

## 2024-05-23 DIAGNOSIS — F319 Bipolar disorder, unspecified: Secondary | ICD-10-CM | POA: Diagnosis not present

## 2024-06-07 ENCOUNTER — Ambulatory Visit: Admitting: Family Medicine

## 2024-06-10 ENCOUNTER — Encounter: Payer: Self-pay | Admitting: Gastroenterology

## 2024-06-10 ENCOUNTER — Ambulatory Visit: Admitting: Gastroenterology

## 2024-06-10 VITALS — BP 117/75 | HR 82 | Temp 98.2°F | Resp 16 | Ht 65.0 in | Wt 152.0 lb

## 2024-06-10 DIAGNOSIS — J45909 Unspecified asthma, uncomplicated: Secondary | ICD-10-CM | POA: Diagnosis not present

## 2024-06-10 DIAGNOSIS — K319 Disease of stomach and duodenum, unspecified: Secondary | ICD-10-CM | POA: Diagnosis not present

## 2024-06-10 DIAGNOSIS — K5900 Constipation, unspecified: Secondary | ICD-10-CM

## 2024-06-10 DIAGNOSIS — D509 Iron deficiency anemia, unspecified: Secondary | ICD-10-CM | POA: Diagnosis not present

## 2024-06-10 DIAGNOSIS — K5730 Diverticulosis of large intestine without perforation or abscess without bleeding: Secondary | ICD-10-CM | POA: Diagnosis not present

## 2024-06-10 DIAGNOSIS — I10 Essential (primary) hypertension: Secondary | ICD-10-CM | POA: Diagnosis not present

## 2024-06-10 DIAGNOSIS — F419 Anxiety disorder, unspecified: Secondary | ICD-10-CM | POA: Diagnosis not present

## 2024-06-10 DIAGNOSIS — F32A Depression, unspecified: Secondary | ICD-10-CM | POA: Diagnosis not present

## 2024-06-10 MED ORDER — SODIUM CHLORIDE 0.9 % IV SOLN: 500.0000 mL | INTRAVENOUS | Status: DC

## 2024-06-10 NOTE — Op Note (Signed)
 [facility] Patient Name: Julie [name] Procedure Date: 06/10/2024 10:30 AM MRN: [id number] Endoscopist: Glendia E. Stacia , MD, [id number] Age: 28 Referring MD:  Date of Birth: [birth year] Gender: Female Account #: [id number] Procedure:                Upper GI endoscopy Indications:              Iron  deficiency anemia Medicines:                Monitored Anesthesia Care Procedure:                Pre-Anesthesia Assessment:                           - Prior to the procedure, a History and Physical                            was performed, and patient medications and                            allergies were reviewed. The patient's tolerance of                            previous anesthesia was also reviewed. The risks                            and benefits of the procedure and the sedation                            options and risks were discussed with the patient.                            All questions were answered, and informed consent                            was obtained. Prior Anticoagulants: The patient has                            taken no anticoagulant or antiplatelet agents. ASA                            Grade Assessment: II - A patient with mild systemic                            disease. After reviewing the risks and benefits,                            the patient was deemed in satisfactory condition to                            undergo the procedure.                           After obtaining informed consent, the endoscope was  passed under direct vision. Throughout the                            procedure, the patient's blood pressure, pulse, and                            oxygen saturations were monitored continuously. The                            GIF [id number] #[id number] was introduced through the                            mouth, and advanced to the second part of duodenum.                            The upper GI endoscopy  was accomplished without                            difficulty. The patient tolerated the procedure                            well. Scope In: Scope Out: Findings:                 The examined portions of the nasopharynx,                            oropharynx and larynx were normal.                           The examined esophagus was normal.                           The entire examined stomach was normal. Biopsies                            were taken with a cold forceps for Helicobacter                            pylori testing. Estimated blood loss was minimal.                           The examined duodenum was normal. Biopsies for                            histology were taken with a cold forceps for                            evaluation of celiac disease. Estimated blood loss                            was minimal. Complications:            No immediate complications. Estimated Blood Loss:     Estimated blood loss was minimal. Impression:               -  The examined portions of the nasopharynx,                            oropharynx and larynx were normal.                           - Normal esophagus.                           - Normal stomach. Biopsied.                           - Normal examined duodenum. Biopsied.                           - No endoscopic abnormalities to explain iron                             deficiency anemia. Recommendation:           - Patient has a contact number available for                            emergencies. The signs and symptoms of potential                            delayed complications were discussed with the                            patient. Return to normal activities tomorrow.                            Written discharge instructions were provided to the                            patient.                           - Resume previous diet.                           - Continue present medications.                           - Await  pathology results. Quatisha Zylka E. Stacia, MD 06/10/2024 11:02:30 AM This report has been signed electronically.

## 2024-06-10 NOTE — Progress Notes (Signed)
 [facility] History and Physical   Primary Care Physician:  Sebastian Beverley NOVAK, MD   Reason for Procedure:   Iron  deficiency anemia  Plan:    EGD, colonoscopy   HPI: Julie [name] is a 28 y.o. female undergoing EGD and colonoscopy to evaluate chronic iron  deficiency anemia.  She has no family history of colon cancer or IBD.   She has chronic constipation, but otherwise no chronic GI symptoms.  The patient was provided an opportunity to ask questions and all were answered. The patient agreed with the plan    Past Medical History:  Diagnosis Date   Anxiety    Asthma    Depression    Hypertension     Past Surgical History:  Procedure Laterality Date   INSERTION OF IMPLANON  ROD  04/13/2020       NO PAST SURGERIES      Prior to Admission medications   Medication Sig Start Date End Date Taking? Authorizing Provider  Iron , Ferrous Sulfate , 325 (65 Fe) MG TABS Take 325 mg by mouth in the morning and at bedtime. Take with Vitamin C . 06/14/23 06/10/24 Yes Sebastian Beverley NOVAK, MD  KELNOR  1/35 1-35 MG-MCG tablet Take 1 tablet by mouth daily. 09/24/23  Yes [provider]  lamoTRIgine  (LAMICTAL ) 100 MG tablet Take 100 mg by mouth daily. 03/28/23  Yes [provider]  NUVIGIL  50 MG tablet Take 1 tablet (50 mg total) by mouth daily. 02/16/24 08/14/24 Yes Sebastian Beverley NOVAK, MD  QUEtiapine  (SEROQUEL ) 300 MG tablet Take 300 mg by mouth at bedtime. 09/13/23  Yes [provider]    Current Outpatient Medications  Medication Sig Dispense Refill   Iron , Ferrous Sulfate , 325 (65 Fe) MG TABS Take 325 mg by mouth in the morning and at bedtime. Take with Vitamin C . 30 tablet 11   KELNOR  1/35 1-35 MG-MCG tablet Take 1 tablet by mouth daily.     lamoTRIgine  (LAMICTAL ) 100 MG tablet Take 100 mg by mouth daily.     NUVIGIL  50 MG tablet Take 1 tablet (50 mg total) by mouth daily. 90 tablet 1   QUEtiapine  (SEROQUEL ) 300 MG tablet Take 300 mg by mouth at bedtime.      Current Facility-Administered Medications  Medication Dose Route Frequency Provider Last Rate Last Admin   0.9 %  sodium chloride  infusion  500 mL Intravenous Continuous Stacia Glendia BRAVO, MD        Allergies as of 06/10/2024   (No Known Allergies)    Family History  Problem Relation Age of Onset   Healthy Mother    Bipolar disorder Father    Alcohol abuse Paternal Grandmother    Colon cancer Neg Hx    Colon polyps Neg Hx    Esophageal cancer Neg Hx    Pancreatic cancer Neg Hx    Stomach cancer Neg Hx     Social History   Socioeconomic History   Marital status: Single    Spouse name: Not on file   Number of children: Not on file   Years of education: Not on file   Highest education level: Some college, no degree  Occupational History   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 [employer]  Tobacco Use   Smoking status: Never    Passive exposure: Never   Smokeless tobacco: Never  Vaping Use   Vaping status: Never Used  Substance and Sexual Activity   Alcohol use: No  Comment: hx of substance abuse.Denies all now   Drug use: Not Currently    Comment: History of drug use/   Sexual activity: Yes    Partners: Male    Birth control/protection: Pill, None  Other Topics Concern   Not on file  Social History Narrative   Not on file   Social Drivers of Health   Financial Resource Strain: Low Risk  (05/16/2023)   Overall Financial Resource Strain (CARDIA)    Difficulty of Paying Living Expenses: Not very hard  Food Insecurity: No Food Insecurity (05/16/2023)   Hunger Vital Sign    Worried About Running Out of Food in the Last Year: Never true    Ran Out of Food in the Last Year: Never true  Transportation Needs: No Transportation Needs (05/16/2023)   PRAPARE - Administrator, Civil Service (Medical): No    Lack of Transportation (Non-Medical): No  Physical Activity: Inactive (12/22/2023)   Exercise Vital Sign     Days of Exercise per Week: 0 days    Minutes of Exercise per Session: 0 min  Stress: Stress Concern Present (12/14/2023)   [employer] of Occupational Health - Occupational Stress Questionnaire    Feeling of Stress: To some extent  Social Connections: Socially Isolated (12/22/2023)   Social Connection and Isolation Panel    Frequency of Communication with Friends and Family: Never    Frequency of Social Gatherings with Friends and Family: Never    Attends Religious Services: Never    Database Administrator or Organizations: No    Attends Banker Meetings: Never    Marital Status: Never married  Intimate Partner Violence: Not At Risk (12/22/2023)   Humiliation, Afraid, Rape, and Kick questionnaire    Fear of Current or Ex-Partner: No    Emotionally Abused: No    Physically Abused: No    Sexually Abused: No    Review of Systems:  All other review of systems negative except as mentioned in the HPI.  Physical Exam: Vital signs BP 126/84   Pulse 100   Temp 98.2 F (36.8 C)   Ht 5' 5 (1.651 m)   Wt 152 lb (68.9 kg)   SpO2 100%   BMI 25.29 kg/m   General:   Alert,  Well-developed, well-nourished, pleasant and cooperative in NAD Airway:  Mallampati 1 Lungs:  Clear throughout to auscultation.   Heart:  Regular rate and rhythm; no murmurs, clicks, rubs,  or gallops. Abdomen:  Soft, nontender and nondistended. Normal bowel sounds.   Neuro/Psych:  Normal mood and affect. A and O x 3   Lee Kalt E. Stacia, MD [facility]

## 2024-06-10 NOTE — Progress Notes (Signed)
 Called to room to assist during endoscopic procedure.  Patient ID and intended procedure confirmed with present staff. Received instructions for my participation in the procedure from the performing physician.

## 2024-06-10 NOTE — Op Note (Signed)
 [facility] Patient Name: Julie [name] Procedure Date: 06/10/2024 10:21 AM MRN: [id number] Endoscopist: Glendia E. Stacia , MD, [id number] Age: 28 Referring MD:  Date of Birth: [birth year] Gender: Female Account #: [id number] Procedure:                Colonoscopy Indications:              Iron  deficiency anemia Medicines:                Monitored Anesthesia Care Procedure:                Pre-Anesthesia Assessment:                           - Prior to the procedure, a History and Physical                            was performed, and patient medications and                            allergies were reviewed. The patient's tolerance of                            previous anesthesia was also reviewed. The risks                            and benefits of the procedure and the sedation                            options and risks were discussed with the patient.                            All questions were answered, and informed consent                            was obtained. Prior Anticoagulants: The patient has                            taken no anticoagulant or antiplatelet agents. ASA                            Grade Assessment: II - A patient with mild systemic                            disease. After reviewing the risks and benefits,                            the patient was deemed in satisfactory condition to                            undergo the procedure.                           After obtaining informed consent, the colonoscope  was passed under direct vision. Throughout the                            procedure, the patient's blood pressure, pulse, and                            oxygen saturations were monitored continuously. The                            Olympus Scope SN [id number] was introduced through the                            anus and advanced to the the terminal ileum, with                            identification of the  appendiceal orifice and IC                            valve. The colonoscopy was performed without                            difficulty. The patient tolerated the procedure                            well. The quality of the bowel preparation was                            good. The terminal ileum, ileocecal valve,                            appendiceal orifice, and rectum were photographed.                            The bowel preparation used was SUPREP via split                            dose instruction. Scope In: 10:45:55 AM Scope Out: 10:56:40 AM Scope Withdrawal Time: 0 hours 5 minutes 39 seconds  Total Procedure Duration: 0 hours 10 minutes 45 seconds  Findings:                 The perianal and digital rectal examinations were                            normal. Pertinent negatives include normal                            sphincter tone and no palpable rectal lesions.                           A single medium-mouthed diverticulum was found in                            the transverse colon.  The colon (entire examined portion) appeared normal.                           The terminal ileum appeared normal.                           The retroflexed view of the distal rectum and anal                            verge was normal and showed no anal or rectal                            abnormalities. Complications:            No immediate complications. Estimated Blood Loss:     Estimated blood loss: none. Impression:               - Diverticulosis in the transverse colon.                           - The entire examined colon is normal.                           - The examined portion of the ileum was normal.                           - The distal rectum and anal verge are normal on                            retroflexion view.                           - Low suspicion for GI source of iron  deficiency.                           - No specimens  collected. Recommendation:           - Patient has a contact number available for                            emergencies. The signs and symptoms of potential                            delayed complications were discussed with the                            patient. Return to normal activities tomorrow.                            Written discharge instructions were provided to the                            patient.                           - Resume previous diet.                           -  Continue present medications.                           - Repeat colonoscopy at age 28 for screening                            purposes. Remonia Otte E. Stacia, MD 06/10/2024 11:06:16 AM This report has been signed electronically.

## 2024-06-10 NOTE — Progress Notes (Signed)
 Sedate, gd SR, tolerated procedure well, VSS, report to RN

## 2024-06-10 NOTE — Progress Notes (Deleted)
 History and Physical Interval Note:  06/10/2024 10:27 AM  Julie [name]  has presented today for endoscopic procedure(s), with the diagnosis of  Encounter Diagnoses  Name Primary?   Iron  deficiency anemia, unspecified iron  deficiency anemia type Yes   Constipation, unspecified constipation type   .  The various methods of evaluation and treatment have been discussed with the patient and/or family. After consideration of risks, benefits and other options for treatment, the patient has consented to  the endoscopic procedure(s).   The patient's history has been reviewed, patient examined, no change in status, stable for endoscopic procedure(s).  I have reviewed the patient's chart and labs.  Questions were answered to the patient's satisfaction.     Daiton Cowles E. Stacia, MD [facility]

## 2024-06-10 NOTE — Progress Notes (Signed)
 Pt's states no medical or surgical changes since previsit or office visit.

## 2024-06-10 NOTE — Patient Instructions (Signed)
 YOU HAD AN ENDOSCOPIC PROCEDURE TODAY AT THE [facility]:   Refer to the procedure report that was given to you for any specific questions about what was found during the examination.  If the procedure report does not answer your questions, please call your gastroenterologist to clarify.  If you requested that your care partner not be given the details of your procedure findings, then the procedure report has been included in a sealed envelope for you to review at your convenience later.  YOU SHOULD EXPECT: Some feelings of bloating in the abdomen. Passage of more gas than usual.  Walking can help get rid of the air that was put into your GI tract during the procedure and reduce the bloating. If you had a lower endoscopy (such as a colonoscopy or flexible sigmoidoscopy) you may notice spotting of blood in your stool or on the toilet paper. If you underwent a bowel prep for your procedure, you may not have a normal bowel movement for a few days.  Please Note:  You might notice some irritation and congestion in your nose or some drainage.  This is from the oxygen used during your procedure.  There is no need for concern and it should clear up in a day or so.  SYMPTOMS TO REPORT IMMEDIATELY:  Following lower endoscopy (colonoscopy or flexible sigmoidoscopy):  Excessive amounts of blood in the stool  Significant tenderness or worsening of abdominal pains  Swelling of the abdomen that is new, acute  Fever of 100F or higher  Following upper endoscopy (EGD)  Vomiting of blood or coffee ground material  New chest pain or pain under the shoulder blades  Painful or persistently difficult swallowing  New shortness of breath  Fever of 100F or higher  Black, tarry-looking stools  Resume previous diet Continue present medications Repeat colonoscopy at age 16 for screening purposes   For urgent or emergent issues, a gastroenterologist can be reached at any hour by calling [phone]  [phone]. Do not use MyChart messaging for urgent concerns.    DIET:  We do recommend a small meal at first, but then you may proceed to your regular diet.  Drink plenty of fluids but you should avoid alcoholic beverages for 24 hours.  ACTIVITY:  You should plan to take it easy for the rest of today and you should NOT DRIVE or use heavy machinery until tomorrow (because of the sedation medicines used during the test).    FOLLOW UP: Our staff will call the number listed on your records the next business day following your procedure.  We will call around 7:15- 8:00 am to check on you and address any questions or concerns that you may have regarding the information given to you following your procedure. If we do not reach you, we will leave a message.     If any biopsies were taken you will be contacted by phone or by letter within the next 1-3 weeks.  Please call us  at [phone] if you have not heard about the biopsies in 3 weeks.    SIGNATURES/CONFIDENTIALITY: You and/or your care partner have signed paperwork which will be entered into your electronic medical record.  These signatures attest to the fact that that the information above on your After Visit Summary has been reviewed and is understood.  Full responsibility of the confidentiality of this discharge information lies with you and/or your care-partner.

## 2024-06-11 ENCOUNTER — Telehealth: Payer: Self-pay | Admitting: *Deleted

## 2024-06-11 NOTE — Telephone Encounter (Signed)
 No answer for post procedure call back. Left VM.

## 2024-06-12 LAB — SURGICAL PATHOLOGY

## 2024-06-18 DIAGNOSIS — R5383 Other fatigue: Secondary | ICD-10-CM | POA: Diagnosis not present

## 2024-06-18 DIAGNOSIS — J069 Acute upper respiratory infection, unspecified: Secondary | ICD-10-CM | POA: Diagnosis not present

## 2024-06-18 DIAGNOSIS — R070 Pain in throat: Secondary | ICD-10-CM | POA: Diagnosis not present

## 2024-06-18 DIAGNOSIS — R0981 Nasal congestion: Secondary | ICD-10-CM | POA: Diagnosis not present

## 2024-06-19 ENCOUNTER — Ambulatory Visit: Payer: Self-pay | Admitting: Gastroenterology

## 2024-06-19 NOTE — Progress Notes (Signed)
 Julie [name],  The biopsies taken from you duodenum were unremarkable, with no evidence of celiac disease  The biopsies taken from your stomach were notable for mild reactive gastropathy which is a common finding and often related to use of certain medications (usually NSAIDs), but there was no evidence of Helicobacter pylori infection. This common finding is not felt to necessarily be a cause of any particular symptom and there is no specific treatment or further evaluation recommended.

## 2024-06-20 DIAGNOSIS — F319 Bipolar disorder, unspecified: Secondary | ICD-10-CM | POA: Diagnosis not present

## 2024-06-20 DIAGNOSIS — F4312 Post-traumatic stress disorder, chronic: Secondary | ICD-10-CM | POA: Diagnosis not present

## 2024-07-11 ENCOUNTER — Ambulatory Visit: Admitting: Family Medicine

## 2024-07-11 ENCOUNTER — Encounter: Payer: Self-pay | Admitting: Family Medicine

## 2024-07-11 VITALS — BP 117/76 | HR 106 | Wt 151.0 lb

## 2024-07-11 DIAGNOSIS — Z30011 Encounter for initial prescription of contraceptive pills: Secondary | ICD-10-CM

## 2024-07-11 MED ORDER — KELNOR 1/35 1-35 MG-MCG PO TABS: 1.0000 | ORAL_TABLET | Freq: Every day | ORAL | 4 refills | Status: AC

## 2024-07-11 NOTE — Progress Notes (Signed)
 Patient would like to continue birth control pills and schedule annual at a later date. Kaylena Pacifico l Yaritzel Stange, CMA

## 2024-07-12 NOTE — Progress Notes (Signed)
" ° °  Acute Office Visit  Subjective:     Patient ID: Julie [name], female    DOB: 1996-01-06, 29 y.o.   MRN: [id number]  No chief complaint on file.   HPI  Patient here to get established.  She has been on birth control and needs refill.  She has been on the same birth control for quite a while with good efficacy.  Denies problems with that.  She has been taking it continuously by skipping the nonhormonal days.  Every 4 months, she takes the nonhormonal days and has a period.  ROS Per HPI      Objective:    BP 117/76 (BP Location: Left Arm, Patient Position: Sitting, Cuff Size: Normal)   Pulse (!) 106   Wt 151 lb (68.5 kg)   LMP 07/02/2024 (Exact Date)   BMI 25.13 kg/m    Physical Exam Vitals reviewed.  Constitutional:      Appearance: Normal appearance.  Cardiovascular:     Rate and Rhythm: Normal rate and regular rhythm.     Pulses: Normal pulses.     Heart sounds: Normal heart sounds.  Pulmonary:     Effort: Pulmonary effort is normal.     Breath sounds: Normal breath sounds.  Neurological:     Mental Status: She is alert.  Psychiatric:        Mood and Affect: Mood normal.        Behavior: Behavior normal.        Thought Content: Thought content normal.        Judgment: Judgment normal.     No results found for any visits on 07/11/24.      Assessment & Plan:     No orders of the defined types were placed in this encounter.    Meds ordered this encounter  Medications   KELNOR  1/35 1-35 MG-MCG tablet    Sig: Take 1 tablet by mouth daily. Skip last week and start new pack for continuous birth control.    Dispense:  84 tablet    Refill:  4    No follow-ups on file.  Morris Longenecker J Lodema Parma, DO   "

## 2024-08-09 ENCOUNTER — Inpatient Hospital Stay: Admitting: Family

## 2024-08-09 ENCOUNTER — Inpatient Hospital Stay: Attending: Hematology & Oncology

## 2024-08-09 ENCOUNTER — Other Ambulatory Visit: Payer: Self-pay

## 2024-08-09 ENCOUNTER — Encounter: Payer: Self-pay | Admitting: Family

## 2024-08-09 VITALS — BP 127/84 | HR 91 | Temp 97.9°F | Resp 17 | Ht 65.0 in | Wt 155.0 lb

## 2024-08-09 DIAGNOSIS — D509 Iron deficiency anemia, unspecified: Secondary | ICD-10-CM

## 2024-08-09 LAB — CBC WITH DIFFERENTIAL (CANCER CENTER ONLY)
Abs Immature Granulocytes: 0.04 10*3/uL (ref 0.00–0.07)
Basophils Absolute: 0.1 10*3/uL (ref 0.0–0.1)
Basophils Relative: 1 %
Eosinophils Absolute: 0.1 10*3/uL (ref 0.0–0.5)
Eosinophils Relative: 3 %
HCT: 36.3 % (ref 36.0–46.0)
Hemoglobin: 12.4 g/dL (ref 12.0–15.0)
Immature Granulocytes: 1 %
Lymphocytes Relative: 44 %
Lymphs Abs: 2.1 10*3/uL (ref 0.7–4.0)
MCH: 30.2 pg (ref 26.0–34.0)
MCHC: 34.2 g/dL (ref 30.0–36.0)
MCV: 88.5 fL (ref 80.0–100.0)
Monocytes Absolute: 0.3 10*3/uL (ref 0.1–1.0)
Monocytes Relative: 5 %
Neutro Abs: 2.3 10*3/uL (ref 1.7–7.7)
Neutrophils Relative %: 46 %
Platelet Count: 365 10*3/uL (ref 150–400)
RBC: 4.1 MIL/uL (ref 3.87–5.11)
RDW: 11.9 % (ref 11.5–15.5)
WBC Count: 4.9 10*3/uL (ref 4.0–10.5)
nRBC: 0 % (ref 0.0–0.2)

## 2024-08-09 LAB — IRON AND IRON BINDING CAPACITY (CC-WL,HP ONLY)
Iron: 121 ug/dL (ref 28–170)
Saturation Ratios: 28 % (ref 10.4–31.8)
TIBC: 440 ug/dL (ref 250–450)
UIBC: 319 ug/dL

## 2024-08-09 LAB — FERRITIN: Ferritin: 376 ng/mL — ABNORMAL HIGH (ref 11–307)

## 2024-08-09 NOTE — Progress Notes (Signed)
 " Hematology and Oncology Follow Up Visit  Ellice Stephenie Navejas [id number] 12-02-1995 29 y.o. 08/09/2024   Principle Diagnosis:  Iron  deficiency anemia secondary to inadequate dietary intake     Current Therapy:        IV iron  as indicated    Interim History:  Ms. Vahey is here today for follow-up. She is noticing increased fatigue and wanting to sleep more.  She is currently on her cycle which she has every 4 months with her current birth control regimen.  No other blood loss noted.  No fever, chills, n/v, cough, rash, dizziness, SOB, chest pain, palpitations, abdominal pain or changes in bowel or bladder habits.  No swelling, numbness or tingling in her extremities.  No falls or syncope.  Appetite and hydration are good. Weight is stable at 155 lbs.   ECOG Performance Status: 1 - Symptomatic but completely ambulatory  Medications:  Allergies as of 08/09/2024   No Known Allergies      Medication List        Accurate as of August 09, 2024 10:27 AM. If you have any questions, ask your nurse or doctor.          Kelnor  1/35 1-35 MG-MCG tablet Generic drug: ethynodiol-ethinyl estradiol  Take 1 tablet by mouth daily. Skip last week and start new pack for continuous birth control.   lamoTRIgine  100 MG tablet Commonly known as: LAMICTAL  Take 100 mg by mouth daily.   QUEtiapine  300 MG tablet Commonly known as: SEROQUEL  Take 300 mg by mouth at bedtime.        Allergies: Allergies[1]  Past Medical History, Surgical history, Social history, and Family History were reviewed and updated.  Review of Systems: All other 10 point review of systems is negative.   Physical Exam:  vitals were not taken for this visit.   Wt Readings from Last 3 Encounters:  07/11/24 151 lb (68.5 kg)  06/10/24 152 lb (68.9 kg)  05/10/24 152 lb (68.9 kg)    Ocular: Sclerae unicteric, pupils equal, round and reactive to light Ear-nose-throat: Oropharynx clear, dentition fair Lymphatic: No  cervical or supraclavicular adenopathy Lungs no rales or rhonchi, good excursion bilaterally Heart regular rate and rhythm, no murmur appreciated Abd soft, nontender, positive bowel sounds MSK no focal spinal tenderness, no joint edema Neuro: non-focal, well-oriented, appropriate affect Breasts: Deferred   Lab Results  Component Value Date   WBC 4.9 08/09/2024   HGB 12.4 08/09/2024   HCT 36.3 08/09/2024   MCV 88.5 08/09/2024   PLT 365 08/09/2024   Lab Results  Component Value Date   FERRITIN 332 (H) 05/10/2024   IRON  103 05/10/2024   TIBC 435 05/10/2024   UIBC 332 05/10/2024   IRONPCTSAT 24 05/10/2024   Lab Results  Component Value Date   RETICCTPCT 1.5 05/10/2024   RBC 4.10 08/09/2024   No results found for: JONATHAN BONG [facility] Lab Results  Component Value Date   IGGSERUM 938 10/17/2023   IGMSERUM 75 10/17/2023   No results found for: STEPHANY CARLOTA BENSON MARKEL EARLA JOANNIE DOC VICK, SPEI   Chemistry      Component Value Date/Time   NA 138 05/07/2024 0927   K 3.8 05/07/2024 0927   CL 106 05/07/2024 0927   CO2 26 05/07/2024 0927   BUN 11 05/07/2024 0927   CREATININE 0.66 05/07/2024 0927      Component Value Date/Time   CALCIUM  9.1 05/07/2024 0927   ALKPHOS 45 05/07/2024 0927   AST 10 05/07/2024 0927  ALT 11 05/07/2024 0927   BILITOT 0.4 05/07/2024 9072       Impression and Plan: Ms. Anise is a pleasant 29 yo caucasian female with history of IDA secondary to inadequate dietary intake.  Iron  studies are pending. We will replace if needed.  Follow-up in 4 months.   Lauraine Pepper, NP 2/6/202610:27 AM     [1] No Known Allergies  "

## 2024-08-15 ENCOUNTER — Ambulatory Visit: Admitting: Family Medicine

## 2024-12-06 ENCOUNTER — Inpatient Hospital Stay

## 2024-12-06 ENCOUNTER — Inpatient Hospital Stay: Admitting: Family
